# Patient Record
Sex: Male | Born: 1940 | Race: Black or African American | Hispanic: No | Marital: Single | Smoking: Former smoker
Health system: Southern US, Community
[De-identification: ages and names within clinical notes are randomized; demographics above are authoritative.]

## PROBLEM LIST (undated history)

## (undated) DIAGNOSIS — J45909 Unspecified asthma, uncomplicated: Secondary | ICD-10-CM

## (undated) DIAGNOSIS — M810 Age-related osteoporosis without current pathological fracture: Secondary | ICD-10-CM

## (undated) DIAGNOSIS — G4730 Sleep apnea, unspecified: Secondary | ICD-10-CM

## (undated) DIAGNOSIS — I10 Essential (primary) hypertension: Secondary | ICD-10-CM

## (undated) DIAGNOSIS — M1990 Unspecified osteoarthritis, unspecified site: Secondary | ICD-10-CM

## (undated) HISTORY — DX: Age-related osteoporosis without current pathological fracture: M81.0

## (undated) HISTORY — DX: Unspecified osteoarthritis, unspecified site: M19.90

## (undated) HISTORY — PX: SHOULDER ARTHROSCOPY DISTAL CLAVICLE EXCISION AND OPEN ROTATOR CUFF REPAIR: SHX2396

## (undated) HISTORY — PX: KNEE ARTHROPLASTY: SHX992

---

## 2004-09-03 ENCOUNTER — Emergency Department: Payer: Self-pay | Admitting: Internal Medicine

## 2004-09-09 ENCOUNTER — Emergency Department: Payer: Self-pay | Admitting: Internal Medicine

## 2005-05-13 ENCOUNTER — Ambulatory Visit: Payer: Self-pay | Admitting: *Deleted

## 2006-06-06 ENCOUNTER — Ambulatory Visit: Payer: Self-pay | Admitting: *Deleted

## 2007-01-08 ENCOUNTER — Ambulatory Visit: Payer: Self-pay

## 2007-04-04 ENCOUNTER — Ambulatory Visit: Payer: Self-pay | Admitting: Family Medicine

## 2007-07-24 ENCOUNTER — Ambulatory Visit: Payer: Self-pay | Admitting: Specialist

## 2008-03-07 ENCOUNTER — Inpatient Hospital Stay: Payer: Self-pay | Admitting: Internal Medicine

## 2008-04-16 ENCOUNTER — Ambulatory Visit: Payer: Self-pay | Admitting: Specialist

## 2008-07-04 ENCOUNTER — Encounter: Payer: Self-pay | Admitting: Internal Medicine

## 2008-07-24 ENCOUNTER — Encounter: Payer: Self-pay | Admitting: Internal Medicine

## 2008-08-23 ENCOUNTER — Encounter: Payer: Self-pay | Admitting: Internal Medicine

## 2009-04-16 ENCOUNTER — Inpatient Hospital Stay: Payer: Self-pay | Admitting: Internal Medicine

## 2009-05-18 ENCOUNTER — Inpatient Hospital Stay: Payer: Self-pay | Admitting: Internal Medicine

## 2010-01-12 ENCOUNTER — Ambulatory Visit: Payer: Self-pay

## 2010-02-10 ENCOUNTER — Ambulatory Visit: Payer: Self-pay

## 2011-01-06 ENCOUNTER — Ambulatory Visit: Payer: Self-pay

## 2012-10-30 ENCOUNTER — Ambulatory Visit: Payer: Self-pay | Admitting: Gastroenterology

## 2013-11-19 DIAGNOSIS — M170 Bilateral primary osteoarthritis of knee: Secondary | ICD-10-CM | POA: Insufficient documentation

## 2013-12-23 ENCOUNTER — Encounter: Payer: Self-pay | Admitting: Surgery

## 2014-01-06 ENCOUNTER — Encounter: Payer: Self-pay | Admitting: Surgery

## 2014-01-23 ENCOUNTER — Encounter: Payer: Self-pay | Admitting: Surgery

## 2014-10-20 DIAGNOSIS — M5136 Other intervertebral disc degeneration, lumbar region: Secondary | ICD-10-CM | POA: Insufficient documentation

## 2015-01-02 ENCOUNTER — Other Ambulatory Visit: Payer: Self-pay | Admitting: Internal Medicine

## 2015-01-02 DIAGNOSIS — R1012 Left upper quadrant pain: Secondary | ICD-10-CM

## 2015-01-09 ENCOUNTER — Ambulatory Visit
Admission: RE | Admit: 2015-01-09 | Discharge: 2015-01-09 | Disposition: A | Discharge status: Home / Self Care | Payer: Medicare Other | Source: Ambulatory Visit | Attending: Internal Medicine | Admitting: Internal Medicine

## 2015-01-09 DIAGNOSIS — K5730 Diverticulosis of large intestine without perforation or abscess without bleeding: Secondary | ICD-10-CM | POA: Diagnosis not present

## 2015-01-09 DIAGNOSIS — R1012 Left upper quadrant pain: Secondary | ICD-10-CM | POA: Insufficient documentation

## 2015-01-09 DIAGNOSIS — J9811 Atelectasis: Secondary | ICD-10-CM | POA: Insufficient documentation

## 2015-01-09 HISTORY — DX: Unspecified asthma, uncomplicated: J45.909

## 2015-01-09 MED ORDER — IOHEXOL 350 MG/ML SOLN
100.0000 mL | Freq: Once | INTRAVENOUS | Status: AC | PRN
  Administered 2015-01-09: 100 mL via INTRAVENOUS

## 2015-02-26 DIAGNOSIS — M62830 Muscle spasm of back: Secondary | ICD-10-CM | POA: Insufficient documentation

## 2015-03-11 DIAGNOSIS — J45909 Unspecified asthma, uncomplicated: Secondary | ICD-10-CM | POA: Insufficient documentation

## 2015-03-11 DIAGNOSIS — I878 Other specified disorders of veins: Secondary | ICD-10-CM | POA: Insufficient documentation

## 2015-03-11 DIAGNOSIS — G4733 Obstructive sleep apnea (adult) (pediatric): Secondary | ICD-10-CM | POA: Insufficient documentation

## 2015-03-11 DIAGNOSIS — I10 Essential (primary) hypertension: Secondary | ICD-10-CM | POA: Insufficient documentation

## 2015-03-11 DIAGNOSIS — I509 Heart failure, unspecified: Secondary | ICD-10-CM | POA: Insufficient documentation

## 2015-03-11 DIAGNOSIS — E7800 Pure hypercholesterolemia, unspecified: Secondary | ICD-10-CM | POA: Insufficient documentation

## 2015-04-23 ENCOUNTER — Emergency Department: Payer: Medicare Other

## 2015-04-23 ENCOUNTER — Emergency Department
Admission: EM | Admit: 2015-04-23 | Discharge: 2015-04-23 | Disposition: A | Discharge status: Home / Self Care | Payer: Medicare Other | Attending: Student | Admitting: Student

## 2015-04-23 DIAGNOSIS — R109 Unspecified abdominal pain: Secondary | ICD-10-CM

## 2015-04-23 DIAGNOSIS — I10 Essential (primary) hypertension: Secondary | ICD-10-CM | POA: Diagnosis not present

## 2015-04-23 DIAGNOSIS — N12 Tubulo-interstitial nephritis, not specified as acute or chronic: Secondary | ICD-10-CM | POA: Diagnosis not present

## 2015-04-23 DIAGNOSIS — R1032 Left lower quadrant pain: Secondary | ICD-10-CM | POA: Diagnosis present

## 2015-04-23 HISTORY — DX: Essential (primary) hypertension: I10

## 2015-04-23 LAB — COMPREHENSIVE METABOLIC PANEL
ALBUMIN: 4.6 g/dL (ref 3.5–5.0)
ALK PHOS: 57 U/L (ref 38–126)
ALT: 29 U/L (ref 17–63)
ANION GAP: 6 (ref 5–15)
AST: 27 U/L (ref 15–41)
BILIRUBIN TOTAL: 1.1 mg/dL (ref 0.3–1.2)
BUN: 14 mg/dL (ref 6–20)
CALCIUM: 9.7 mg/dL (ref 8.9–10.3)
CO2: 32 mmol/L (ref 22–32)
Chloride: 106 mmol/L (ref 101–111)
Creatinine, Ser: 1.06 mg/dL (ref 0.61–1.24)
GFR calc Af Amer: >60 mL/min (ref >60)
GFR calc non Af Amer: >60 mL/min (ref >60)
Glucose, Bld: 94 mg/dL (ref 65–99)
POTASSIUM: 3.9 mmol/L (ref 3.5–5.1)
SODIUM: 144 mmol/L (ref 135–145)
TOTAL PROTEIN: 7.8 g/dL (ref 6.5–8.1)

## 2015-04-23 LAB — URINALYSIS COMPLETE WITH MICROSCOPIC (ARMC ONLY)
Bilirubin Urine: NEGATIVE
Glucose, UA: NEGATIVE mg/dL
Hgb urine dipstick: NEGATIVE
KETONES UR: NEGATIVE mg/dL
NITRITE: POSITIVE — AB
PH: 6 (ref 5.0–8.0)
PROTEIN: NEGATIVE mg/dL
SPECIFIC GRAVITY, URINE: 1.02 (ref 1.005–1.030)

## 2015-04-23 LAB — CBC
HCT: 48.2 % (ref 40.0–52.0)
HEMOGLOBIN: 15.9 g/dL (ref 13.0–18.0)
MCH: 29.5 pg (ref 26.0–34.0)
MCHC: 33.1 g/dL (ref 32.0–36.0)
MCV: 89.3 fL (ref 80.0–100.0)
Platelets: 130 10*3/uL — ABNORMAL LOW (ref 150–440)
RBC: 5.39 MIL/uL (ref 4.40–5.90)
RDW: 14.1 % (ref 11.5–14.5)
WBC: 5.5 10*3/uL (ref 3.8–10.6)

## 2015-04-23 LAB — LIPASE, BLOOD: Lipase: 22 U/L (ref 11–51)

## 2015-04-23 MED ORDER — CEPHALEXIN 500 MG PO CAPS: 500.0000 mg | ORAL_CAPSULE | Freq: Four times a day (QID) | ORAL | Status: DC

## 2015-04-23 MED ORDER — OXYCODONE HCL 5 MG PO TABS
10.0000 mg | ORAL_TABLET | Freq: Once | ORAL | Status: AC
  Administered 2015-04-23: 10 mg via ORAL
  Filled 2015-04-23: qty 2

## 2015-04-23 MED ORDER — OXYCODONE HCL 5 MG PO TABS: 5.0000 mg | ORAL_TABLET | Freq: Four times a day (QID) | ORAL | Status: DC | PRN

## 2015-04-23 MED ORDER — IOHEXOL 300 MG/ML  SOLN
100.0000 mL | Freq: Once | INTRAMUSCULAR | Status: AC | PRN
  Administered 2015-04-23: 100 mL via INTRAVENOUS

## 2015-04-23 MED ORDER — CEPHALEXIN 500 MG PO CAPS
500.0000 mg | ORAL_CAPSULE | Freq: Once | ORAL | Status: AC
  Administered 2015-04-23: 500 mg via ORAL
  Filled 2015-04-23: qty 1

## 2015-04-23 NOTE — ED Provider Notes (Signed)
[facility] Emergency Department Provider Note  ____________________________________________  Time seen: Approximately 12:08 PM  I have reviewed the triage vital signs and the nursing notes.   HISTORY  Chief Complaint Abdominal Pain    HPI Jay Sandoval is a 74 y.o. male with history of CHF, COPD, chronic 3 L home oxygen requirement, GERD, hyperlipidemia and hypertension who presents for evaluation of 3 months gradual onset constant left lower abdominal and flank pain worse with movement, constant since onset, currently moderate. Patient reports that he was seen by his primary care doctor approximately 3 months ago for this with a CT scan that was unrevealing. Over the past month his pain has been more severe and was very severe this morning. He went to his primary care doctor's office and was referred to this emergency department for further evaluation of his pain and because his O2 saturation was 83% due to the fact that his oxygen tank had just run out. He denies any chest pain or difficulty breathing. No vomiting, diarrhea, fevers or chills. He has had dark urine but denies dysuria or hematuria.   Past Medical History  Diagnosis Date  . Asthma   . Hypertension     There are no active problems to display for this patient.   Past Surgical History  Procedure Laterality Date  . Knee arthroplasty    . Shoulder arthroscopy distal clavicle excision and open rotator cuff repair      No current outpatient prescriptions on file.  Allergies Review of patient's allergies indicates no known allergies.  No family history on file.  Social History Social History  Substance Use Topics  . Smoking status: Never Smoker   . Smokeless tobacco: None  . Alcohol Use: No    Review of Systems Constitutional: No fever/chills Eyes: No visual changes. ENT: No sore throat. Cardiovascular: Denies chest pain. Respiratory: Denies shortness of breath. Gastrointestinal:  + abdominal pain.  No nausea, no vomiting.  No diarrhea.  No constipation. Genitourinary: Negative for dysuria. Musculoskeletal: Negative for back pain. Skin: Negative for rash. Neurological: Negative for headaches, focal weakness or numbness.  10-point ROS otherwise negative.  ____________________________________________   PHYSICAL EXAM:  VITAL SIGNS: ED Triage Vitals  Enc Vitals Group     BP --      Pulse Rate 04/23/15 1104 58     Resp 04/23/15 1104 22     Temp 04/23/15 1104 98.3 F (36.8 C)     Temp Source 04/23/15 1104 Oral     SpO2 04/23/15 1104 95 %     Weight 04/23/15 1104 387 lb (175.542 kg)     Height 04/23/15 1104 5\' 8"  (1.727 m)     Head Cir --      Peak Flow --      Pain Score 04/23/15 1119 8     Pain Loc --      Pain Edu? --      Excl. in GC? --     Constitutional: Alert and oriented. Well appearing and in no acute distress. +Morbidly obese. Eyes: Conjunctivae are normal. PERRL. EOMI. Head: Atraumatic. Nose: No congestion/rhinnorhea. Mouth/Throat: Mucous membranes are moist.  Oropharynx non-erythematous. Neck: No stridor.  Cardiovascular: Normal rate, regular rhythm. Grossly normal heart sounds.  Good peripheral circulation. Respiratory: Normal respiratory effort.  No retractions. Lungs CTAB. Gastrointestinal: Soft and nontender though severely obese abdomen limits the physical examination. No distention.  No CVA tenderness.  Genitourinary: deferred Musculoskeletal: No lower extremity tenderness nor edema.  No  joint effusions. Neurologic:  Normal speech and language. No gross focal neurologic deficits are appreciated. No gait instability. Skin:  Skin is warm, dry and intact. No rash noted. Psychiatric: Mood and affect are normal. Speech and behavior are normal.  ____________________________________________   LABS (all labs ordered are listed, but only abnormal results are displayed)  Labs Reviewed  CBC - Abnormal; Notable for the following:     Platelets 130 (*)    All other components within normal limits  URINALYSIS COMPLETEWITH MICROSCOPIC ([facility] ONLY) - Abnormal; Notable for the following:    Color, Urine YELLOW (*)    APPearance HAZY (*)    Nitrite POSITIVE (*)    Leukocytes, UA 2+ (*)    Bacteria, UA MANY (*)    Squamous Epithelial / LPF 0-5 (*)    All other components within normal limits  URINE CULTURE  LIPASE, BLOOD  COMPREHENSIVE METABOLIC PANEL   ____________________________________________  EKG  none ____________________________________________  RADIOLOGY  CT abdomen and pelvis IMPRESSION: No acute findings in the abdomen/ pelvis.  1 cm cystic focus over the body of the pancreas unchanged. Recommend followup MRI 1 year. This recommendation follows ACR consensus guidelines: Managing Incidental Findings on Abdominal CT: White Paper of the ACR Incidental Findings Committee. J Am Coll Radiol 2010;7:754-773.  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  Diverticulosis of the colon without active inflammation.  Small back containing umbilical hernia unchanged.  Moderate degenerate change of the spine with disc disease at all levels of the lumbar spine.  ____________________________________________   PROCEDURES  Procedure(s) performed: None  Critical Care performed: No  ____________________________________________   INITIAL IMPRESSION / ASSESSMENT AND PLAN / ED COURSE  Pertinent labs & imaging results that were available during my care of the patient were reviewed by me and considered in my medical decision making (see chart for details).  Jay Sandoval is a 74 y.o. male with history of CHF, COPD, chronic 3 L home oxygen requirement, GERD, hyperlipidemia and hypertension who presents for evaluation of 3 months gradual onset constant left abdominal and flank pain worse with movement. On exam, he is nontoxic appearing and in no acute distress. O2 saturation 97%  on his home 3 L oxygen requirement. His exam is limited due to his obese body habitus. Labs are reviewed. CBC and CMP are unremarkable. Lipase is normal. Urinalysis is consistent with nitrite positive urinary tract infection, possibly early pyelonephritis given complaint of flank pain,  which we will treat with Keflex. CT of the abdomen and pelvis is pending to rule out infected stone and to examine the aortic contours. If unremarkable, anticipate discharge home with Keflex, short course of oxycodone for pain as well as return precautions and close PCP follow-up.  ----------------------------------------- 2:35 PM on 04/23/2015 -----------------------------------------  CT scan shows no acute intra-abdominal or intrapelvic process. Patient reports improvement of his pain at this time. DC as above. We discussed return precautions and he is comfortable with the discharge plan. ____________________________________________   FINAL CLINICAL IMPRESSION(S) / ED DIAGNOSES  Final diagnoses:  Left sided abdominal pain  Pyelonephritis      Gayla Doss, MD 04/23/15 1436

## 2015-04-23 NOTE — ED Notes (Signed)
Pt called [employer] for ride. Offered to send him EMS (since O 2 is out and no one to bring him some) but declines this option bc has no one to get his wheelchair back to residence then.  Pt reports will be ok without O2 bc quick ride home.  Pt left on our O2 while waiting on ride in lobby. Again declined offer to find him way home with O2.Marland Kitchen

## 2015-04-23 NOTE — ED Notes (Signed)
Patient transported to CT 

## 2015-04-23 NOTE — ED Notes (Signed)
Pt c/o left flank pain for the past 3 months, states he has been seen here for the same in the past, c/o having dark urine.Marland Kitchendenies N/V/D.Marland Kitchen

## 2015-04-25 LAB — URINE CULTURE: Culture: >=100000

## 2015-05-14 ENCOUNTER — Encounter: Payer: Medicare Other | Admitting: Physical Therapy

## 2015-05-18 ENCOUNTER — Ambulatory Visit: Payer: Medicare Other | Attending: Physical Medicine and Rehabilitation | Admitting: Physical Therapy

## 2015-05-18 ENCOUNTER — Encounter: Payer: Self-pay | Admitting: Physical Therapy

## 2015-05-18 VITALS — BP 116/64

## 2015-05-18 DIAGNOSIS — M545 Low back pain: Secondary | ICD-10-CM

## 2015-05-18 DIAGNOSIS — I999 Unspecified disorder of circulatory system: Secondary | ICD-10-CM | POA: Insufficient documentation

## 2015-05-18 DIAGNOSIS — J42 Unspecified chronic bronchitis: Secondary | ICD-10-CM | POA: Insufficient documentation

## 2015-05-18 DIAGNOSIS — M810 Age-related osteoporosis without current pathological fracture: Secondary | ICD-10-CM | POA: Insufficient documentation

## 2015-05-18 DIAGNOSIS — M5136 Other intervertebral disc degeneration, lumbar region: Secondary | ICD-10-CM | POA: Diagnosis present

## 2015-05-18 DIAGNOSIS — M069 Rheumatoid arthritis, unspecified: Secondary | ICD-10-CM | POA: Insufficient documentation

## 2015-05-18 DIAGNOSIS — I10 Essential (primary) hypertension: Secondary | ICD-10-CM | POA: Insufficient documentation

## 2015-05-18 NOTE — Therapy (Signed)
[place] [facility] [address] [place], [address] Phone: [phone]   Fax:  [phone]  Physical Therapy Evaluation  Patient Details  Name: Jay Sandoval MRN: [id number] Date of Birth: 05-04-1940 Referring Provider: Berneda Rose Chasinis, DO  Encounter Date: 05/18/2015      PT End of Session - 05/18/15 1528    Visit Number 1   Number of Visits 9   Date for PT Re-Evaluation 06/18/15   Authorization Type g code   PT Start Time 1240   PT Stop Time 1315   PT Time Calculation (min) 35 min   Activity Tolerance Other (comment)   Behavior During Therapy [facility] for tasks assessed/performed      Past Medical History  Diagnosis Date  . Asthma   . Hypertension   . Osteoporosis   . Arthritis     Past Surgical History  Procedure Laterality Date  . Knee arthroplasty    . Shoulder arthroscopy distal clavicle excision and open rotator cuff repair      Filed Vitals:   05/18/15 1452  BP: 116/64  SpO2: 90%    Visit Diagnosis:  Low back pain, unspecified back pain laterality, with sciatica presence unspecified - Plan: PT plan of care cert/re-cert  DDD (degenerative disc disease), lumbar - Plan: PT plan of care cert/re-cert  Circulation problem - Plan: PT plan of care cert/re-cert      Subjective Assessment - 05/18/15 1453    Subjective Pt. reports extensive Hx of LBP originally beginning around 1989 as result of industrial accident. Reports difficulty standing and unable to do so w/o use UE support. LBP aggravated w/ sit to stand and prolonged sitting, need to shift in seat to ease. Able to sleep in bed partially rolled on L side, never on R due to shoulder pain. Reports "I sit more in the chair now than ever" and only occasionally stands. Has had successful PT for LBP in the past "8-10 years ago."    Pertinent History LBP Hx since 1989   Limitations Standing;Walking;House hold activities   How long can you  sit comfortably? pt sits exclusively, other than pivot transfers. Unable to bathe, unable to stand upright.   How long can you stand comfortably? unable to stand w/o UE and flexion at hips   Patient Stated Goals "I want to walk". Pt also reports he would like decr. back pain   Currently in Pain? Yes   Pain Score 8    Pain Location Back   Aggravating Factors  prolonged sitting, transfers            [facility] PT Assessment - 05/18/15 0001    Assessment   Medical Diagnosis lumbar DDD, back muscle spasm   Referring Provider Berneda Rose Chasinis, DO   Prior Therapy yes   Precautions   Precautions Fall   Balance Screen   Has the patient fallen in the past 6 months No   Has the patient had a decrease in activity level because of a fear of falling?  Yes   Is the patient reluctant to leave their home because of a fear of falling?  Yes   Home Environment   Living Environment Private residence   Living Arrangements Alone   Type of Home Apartment   Additional Comments no stairs, ramp, handrails on both sides, pt has helper 5 days per wk, 3 hrs per day.   Prior Function   Level of Independence Needs assistance with ADLs;Needs  assistance with homemaking   Vocation On disability   Leisure "I just watch TV"   Cognition   Overall Cognitive Status Within Functional Limits for tasks assessed   Posture/Postural Control   Posture Comments Pt assessed only in power chair. shoulders forward, slouched position, sacral sitting, reclined position. Unable to correct. Pt reports altered posture is *not* due to pain   ROM / Strength   AROM / PROM / Strength AROM   AROM   Overall AROM Comments Lumbar motion assessed in power chair, all lumbar tested WNL except L sidebending limited, no pain. Pt has pain in L trap with L rotation.   PROM   Overall PROM  --  Knee ROM in sitting: R 150 deg, L 158 deg   Overall PROM Comments -22 knee ext. L, -30 on R. Unable to assess fully PROM due to inability to safely  transfer pt   Palpation   Palpation comment Incr. tension in L UT. Pt also reports pain in this region.   Transfers   Comments unable to safely demonstrate safe transfer, Pt attempted transfer from power chair to elevated plinth, able to lift butocks from chair, hoewver unable to stand or pivot so discontinued this.   Ambulation/Gait   Gait Comments unable to Multimedia programmer Yes   Wheelchair Assistance 6: Modified independent (Device/Increase time)   Distance pt in powerchair, able to utilize appropriately          Objective: Attempted hamstring stretching w/ pt sitting heel elevated on stool, discontinued due to reported anterior knee pain and not feeling stretch of the hamstring. Significant suprapatellar edema noted on L LE, pt reported "that's about how it usually looks." Deferred measurement until next  Performed glute sets 6 sec holds 6X3, pt tolerated well and visibly elevated slightly from chair while performing isometric holds.   Quad sets w/ isometric holds for 3 sec in knee extension 10X3. Pt expressed mild pain in anterior knee during maximal extension. Seated spinal rotation w/ arms crossed hands on shoulders 10 X each direction. Pt reported mild L UT area pain w/ L  rotation.  Attempted transfer to bariatric mat table. Pt stood w/ extensive WB through UE on mat table, hips remained in flexed position, unable to safely negotiate turning to position for lowering to mat, pt instructed to return to [facility].                  PT Education - 05/18/15 1525    Education provided Yes   Education Details educated in performance of HEP (quad/glute sets, spinal rotations), and increasing movement throughout day   Person(s) Educated Patient   Methods Explanation;Demonstration   Comprehension Returned demonstration;Verbalized understanding             PT Long Term Goals - 05/18/15 1703    PT LONG TERM GOAL #1   Title Pt will  demonstrate safe and pain free pivot transfer from w/c to mat  table w/ SBA help with home transfers.   Time 4   Period Weeks   Status New   PT LONG TERM GOAL #2   Title Pt demonstrate B hamstring lenght of < -10 deg. to allow standing w/ decreased muscle use   Time 4   Period Weeks   Status New   PT LONG TERM GOAL #3   Title Pt will be independant w/ HEP to improve strength to allow progression to aquatic therapy.   Time 4  Period Weeks   Status New   PT LONG TERM GOAL #4   Title Pt will improve mODI from 80% to less than 70% indicating improvement in self reported disability due to LBP   Baseline 80%   Time 4   Period Weeks   Status New               Plan - 05/19/15 1530    Clinical Impression Statement Pt is a pleasant 75 y.o. male seen today for PT in power chair. Pt c/o chronic LBP and inability to stand or walk as result of knee pain, deconditioning and potentially exacerbated due to morbid obesity. Currently pt presents with pain, inability to stand due to muscle weakness/pain, inability to transfer to a non lift-chair, extremely poor tolerance for low level activity. Pt would be appropriate for aquatic therapy however this facility is unable to provide this at this time due to wt limit on aquatic w/c and lack of ability to amb into pool on pt part. Pt would benefit from LE strength and endurance training in preparation for aquatic therapy and progressing to ambulation.    Pt will benefit from skilled therapeutic intervention in order to improve on the following deficits Decreased activity tolerance;Decreased endurance;Obesity;Increased edema;Cardiopulmonary status limiting activity;Decreased strength;Impaired UE functional use;Pain;Difficulty walking;Increased muscle spasms;Decreased mobility;Decreased range of motion;Impaired flexibility;Postural dysfunction   Rehab Potential Poor   Clinical Impairments Affecting Rehab Potential weight, age, comorbidities    PT Frequency  2x / week   PT Duration 4 weeks   PT Treatment/Interventions ADLs/Self Care Home Management;Aquatic Therapy;Neuromuscular re-education;Therapeutic activities;Therapeutic exercise;Manual techniques;Wheelchair mobility training   PT Next Visit Plan progress LE strengthing,    PT Home Exercise Plan see objective   Consulted and Agree with Plan of Care Patient          G-Codes - 05/19/15 1700    Functional Assessment Tool Used standing time, transfers   Functional Limitation Mobility: Walking and moving around   Mobility: Walking and Moving Around Current Status (S8110) At least 80 percent but less than 100 percent impaired, limited or restricted       Problem List There are no active problems to display for this patient.   Samamtha Tiegs PT DPT 19-May-2015, 5:08 PM  [place] [facility] PHYS[facility] AND SPORTS MEDICINE [address] [place], [address] Phone: [phone]   Fax:  [phone]  Name: Jay Sandoval MRN: [id number] Date of Birth: 12/12/1940

## 2015-05-21 ENCOUNTER — Ambulatory Visit: Payer: Medicare Other | Admitting: Physical Therapy

## 2015-05-21 DIAGNOSIS — M545 Low back pain: Secondary | ICD-10-CM

## 2015-05-21 DIAGNOSIS — M5136 Other intervertebral disc degeneration, lumbar region: Secondary | ICD-10-CM

## 2015-05-21 NOTE — Therapy (Signed)
[place] [facility] [address], [place], [address] Phone: [phone]   Fax:  [phone]  Physical Therapy Treatment  Patient Details  Name: Jay Sandoval MRN: [id number] Date of Birth: 1940/09/09 Referring Provider: Berneda Rose Chasinis, DO  Encounter Date: 05/21/2015      PT End of Session - 05/21/15 1415    Visit Number 2   Number of Visits 9   Date for PT Re-Evaluation 06/18/15   Authorization Type g code   Activity Tolerance Other (comment)   Behavior During Therapy [facility] for tasks assessed/performed      Past Medical History  Diagnosis Date  . Asthma   . Hypertension   . Osteoporosis   . Arthritis     Past Surgical History  Procedure Laterality Date  . Knee arthroplasty    . Shoulder arthroscopy distal clavicle excision and open rotator cuff repair      There were no vitals filed for this visit.  Visit Diagnosis:  DDD (degenerative disc disease), lumbar  Low back pain, unspecified back pain laterality, with sciatica presence unspecified  Circulation problem      Subjective Assessment - 05/21/15 1255    Subjective Pt reports having an icreased level of pain in B knees and low back "about the same as last time", reports he will be having "injections in both knees on Monday"    Pertinent History LBP Hx since 1989   Limitations Standing;Walking;House hold activities   How long can you sit comfortably? pt sits exclusively, other than pivot transfers. Unable to bathe, unable to stand upright.   How long can you stand comfortably? unable to stand w/o UE and flexion at hips   Patient Stated Goals "I want to walk". Pt also reports he would like decr. back pain   Currently in Pain? Yes   Pain Score 8    Pain Location Back         Objective:   Performed manually resisted knee extension w/ pt seated 3X12 to address LE weakness and prepare for standing/transfers. Pt reports some L low  back and knee discomfort w/ extension of L LE. Notably less strength in L LE compared to R. Seated perturbations w/ pt sitting up right, back away from back rest of w/c, arms crossed over body hands on shoulders. Pt tolerated well w/ no complaints of discomfort.   Manually resisted triceps extension in seated position to increase strength for standing/transfers. R UE notably less strength. Pt tolerated well stating "I feel the burn with that" indicating triceps area.     STM at L lumbar paraspinals and L UT/LS to address pt reports of significant tightness and tenderness in L lumbar paraspinals. Tolerated well stating "it feels like they're starting to relax". Apparent decrease in UT/LS/lumbar paraspinal post STM.  Performed seated marching 3X15 each LE. Report of difficulty lifting L LE, decreased clearance height and bracing on w/c when lifting L  LE.                         PT Education - 05/21/15 1410    Education provided Yes   Education Details instructed in performance and addition of marching to HEP   Person(s) Educated Patient   Methods Explanation;Demonstration   Comprehension Verbalized understanding;Verbal cues required             PT Long Term Goals - 05/18/15 1703    PT LONG TERM GOAL #  1   Title Pt will demonstrate safe and pain free pivot transfer from w/c to mat  table w/ SBA help with home transfers.   Time 4   Period Weeks   Status New   PT LONG TERM GOAL #2   Title Pt demonstrate B hamstring lenght of < -10 deg. to allow standing w/ decreased muscle use   Time 4   Period Weeks   Status New   PT LONG TERM GOAL #3   Title Pt will be independant w/ HEP to improve strength to allow progression to aquatic therapy.   Time 4   Period Weeks   Status New   PT LONG TERM GOAL #4   Title Pt will improve mODI from 80% to less than 70% indicating improvement in self reported disability due to LBP   Baseline 80%   Time 4   Period Weeks   Status New                Plan - 05/21/15 1417    Clinical Impression Statement Pt curently presents with decreased LE and triceps strength, inability to WB or transfer. Pt will benefit from skilled PT to address strength deficits in preperation for standing/transfers.    Pt will benefit from skilled therapeutic intervention in order to improve on the following deficits Decreased activity tolerance;Decreased endurance;Obesity;Increased edema;Cardiopulmonary status limiting activity;Decreased strength;Impaired UE functional use;Pain;Difficulty walking;Increased muscle spasms;Decreased mobility;Decreased range of motion;Impaired flexibility;Postural dysfunction   Rehab Potential Poor   Clinical Impairments Affecting Rehab Potential weight, age, comorbidities    PT Frequency 2x / week   PT Duration 4 weeks   PT Treatment/Interventions ADLs/Self Care Home Management;Aquatic Therapy;Neuromuscular re-education;Therapeutic activities;Therapeutic exercise;Manual techniques;Wheelchair mobility training   PT Next Visit Plan progress LE strengthing,    PT Home Exercise Plan see objective   Consulted and Agree with Plan of Care Patient        Problem List There are no active problems to display for this patient.   Emilia Beck Rij STM 05/21/2015, 2:20 PM Su Hoff PT DPT [facility] AND SPORTS MEDICINE [address], [place], [address] Phone: [phone]   Fax:  [phone]  Name: Jay Sandoval MRN: [id number] Date of Birth: [birth year]

## 2015-05-25 ENCOUNTER — Encounter: Payer: Medicare Other | Admitting: Physical Therapy

## 2015-05-28 ENCOUNTER — Ambulatory Visit: Payer: Medicare Other | Attending: Physical Medicine and Rehabilitation | Admitting: Physical Therapy

## 2015-05-28 ENCOUNTER — Encounter: Payer: Self-pay | Admitting: Physical Therapy

## 2015-05-28 DIAGNOSIS — M545 Low back pain: Secondary | ICD-10-CM | POA: Diagnosis present

## 2015-05-28 DIAGNOSIS — M6281 Muscle weakness (generalized): Secondary | ICD-10-CM | POA: Diagnosis present

## 2015-05-28 NOTE — Therapy (Signed)
[facility] [address], [place], [address] Phone: [phone]   Fax:  [phone]  Physical Therapy Treatment  Patient Details  Name: Jay Sandoval MRN: [id number] Date of Birth: 13-Apr-1941 Referring Provider: Berneda Rose Chasinis, DO  Encounter Date: 05/28/2015      PT End of Session - 05/28/15 1327    Visit Number 3   Number of Visits 9   Date for PT Re-Evaluation 06/18/15   Authorization Type g code   PT Start Time 1250   PT Stop Time 1335   PT Time Calculation (min) 45 min   Activity Tolerance Other (comment)   Behavior During Therapy [facility] for tasks assessed/performed      Past Medical History  Diagnosis Date  . Asthma   . Hypertension   . Osteoporosis   . Arthritis     Past Surgical History  Procedure Laterality Date  . Knee arthroplasty    . Shoulder arthroscopy distal clavicle excision and open rotator cuff repair      There were no vitals filed for this visit.  Visit Diagnosis:  Low back pain, unspecified back pain laterality, with sciatica presence unspecified  Muscle weakness      Subjective Assessment - 05/28/15 1250    Subjective Pt reports feeling "a whole lot better" after last session and that his back "feels a whole lot looser."   Pertinent History LBP Hx since 1989   Limitations Standing;Walking;House hold activities   How long can you sit comfortably? pt sits exclusively, other than pivot transfers. Unable to bathe, unable to stand upright.   How long can you stand comfortably? unable to stand w/o UE and flexion at hips   Patient Stated Goals "I want to walk". Pt also reports he would like decr. back pain   Currently in Pain? Yes   Pain Score 2    Pain Location Back   Multiple Pain Sites No        Objective:   3x20 sec B isometric hip ABD to incr strength in B LE and gain strength needed for standing and transfers.  L LE ABD stronger response than R LE.   Pt reported he felt "muscles in my hips" working during exercise. 3x20 sec B isometric hip ADD with theraball to incr strength in B LE.  Pt had noted fatigue after performing exercise but no incr in pain. PT observed pt perform indep self transfer from motorized chair to mat table.  Unable to stand while performing, used hands for support with stooped squat transfer,  Pt has noted difficulty with transfer although was able to complete under his own power. 3x15 leg press using black TB to incr strength in B LE and UE.  Pt held TB while pushing with his LE reporting minor stiffness and soreness in the knees, particularly the R during exercise.  Noted difficulty for pt to flex hip due to adiposity. 1x10 seated cable rows 15#, 2x10 20# to incr strength in B UE needed to self transfer.  Cuing needed for pt to retract and depress shoulders throughout exercise.                           PT Education - 05/28/15 1302    Education provided Yes   Education Details Pt educated on new HEP and to retract and depress shoulders during UE exercises.   Person(s) Educated Patient   Methods Explanation;Demonstration;Verbal cues;Tactile  cues   Comprehension Verbalized understanding;Returned demonstration             PT Long Term Goals - 05/18/15 1703    PT LONG TERM GOAL #1   Title Pt will demonstrate safe and pain free pivot transfer from w/c to mat  table w/ SBA help with home transfers.   Time 4   Period Weeks   Status New   PT LONG TERM GOAL #2   Title Pt demonstrate B hamstring lenght of < -10 deg. to allow standing w/ decreased muscle use   Time 4   Period Weeks   Status New   PT LONG TERM GOAL #3   Title Pt will be independant w/ HEP to improve strength to allow progression to aquatic therapy.   Time 4   Period Weeks   Status New   PT LONG TERM GOAL #4   Title Pt will improve mODI from 80% to less than 70% indicating improvement in self reported disability due to LBP    Baseline 80%   Time 4   Period Weeks   Status New               Plan - 05/28/15 1305    Clinical Impression Statement Pt continues to present with decr global muscle weakness in B LE however shows improvement as indicated by an incr tolerance for exercise and activity.  Pt fatigues quickly with exercise which forces PT to utilize shorter bouts in order to catch breath.  Pt possess significant strength and endurance deficits that will require skilled PT to meet goals.   Pt will benefit from skilled therapeutic intervention in order to improve on the following deficits Decreased activity tolerance;Decreased endurance;Obesity;Increased edema;Cardiopulmonary status limiting activity;Decreased strength;Impaired UE functional use;Pain;Difficulty walking;Increased muscle spasms;Decreased mobility;Decreased range of motion;Impaired flexibility;Postural dysfunction   Rehab Potential Poor   Clinical Impairments Affecting Rehab Potential weight, age, comorbidities    PT Frequency 2x / week   PT Duration 4 weeks   PT Treatment/Interventions ADLs/Self Care Home Management;Aquatic Therapy;Neuromuscular re-education;Therapeutic activities;Therapeutic exercise;Manual techniques;Wheelchair mobility training   PT Next Visit Plan progress LE strengthing,    PT Home Exercise Plan see objective   Consulted and Agree with Plan of Care Patient        Problem List There are no active problems to display for this patient.   Vilinda Flake 05/28/2015, 1:39 PM  Su Hoff PT DPT  [place] [facility] SPORTS MEDIC[facility] [address], [place], [address] Phone: [phone]   Fax:  [phone]  Name: Jay Sandoval MRN: [id number] Date of Birth: 31-Jul-1940

## 2015-06-01 ENCOUNTER — Ambulatory Visit: Payer: Medicare Other | Admitting: Physical Therapy

## 2015-06-04 ENCOUNTER — Ambulatory Visit: Payer: Medicare Other | Admitting: Physical Therapy

## 2015-06-08 ENCOUNTER — Ambulatory Visit: Payer: Medicare Other | Admitting: Physical Therapy

## 2015-06-08 DIAGNOSIS — M545 Low back pain: Secondary | ICD-10-CM | POA: Diagnosis not present

## 2015-06-08 DIAGNOSIS — M6281 Muscle weakness (generalized): Secondary | ICD-10-CM

## 2015-06-08 NOTE — Therapy (Signed)
[facility] [address], [place], [address] Phone: [phone]   Fax:  [phone]  Physical Therapy Treatment  Patient Details  Name: Jay Sandoval MRN: [id number] Date of Birth: 1940/06/04 Referring Provider: Berneda Rose Chasinis, DO  Encounter Date: 06/08/2015      PT End of Session - 06/08/15 1540    Visit Number 4   Number of Visits 9   Date for PT Re-Evaluation 06/18/15   Authorization Type g code   PT Start Time 1250   PT Stop Time 1345   PT Time Calculation (min) 55 min   Activity Tolerance Other (comment)   Behavior During Therapy [facility] for tasks assessed/performed      Past Medical History  Diagnosis Date  . Asthma   . Hypertension   . Osteoporosis   . Arthritis     Past Surgical History  Procedure Laterality Date  . Knee arthroplasty    . Shoulder arthroscopy distal clavicle excision and open rotator cuff repair      There were no vitals filed for this visit.  Visit Diagnosis:  Muscle weakness  Low back pain, unspecified back pain laterality, with sciatica presence unspecified      Subjective Assessment - 06/08/15 1535    Subjective Pt reports having much less LBP since starting PT. Reports appointment tomorrow with orthopedist about painful lump superior medial knee. Pt reports not being as sore as before since therapy. Reports he will be having gastric bypass surgery sometime in April and has been advised with diet changes to reduce calories/"solid food" intake.   Pertinent History LBP Hx since 1989   Limitations Standing;Walking;House hold activities   How long can you sit comfortably? pt sits exclusively, other than pivot transfers. Unable to bathe, unable to stand upright.   How long can you stand comfortably? unable to stand w/o UE and flexion at hips   Patient Stated Goals "I want to walk". Pt also reports he would like decr. back pain   Currently in Pain? No/denies    Pain Score 0-No pain      Objective:  Performed manually resisted triceps extension in sitting 4X8, pt was noticeably stronger than last session during this exercise. Pt had difficulty completing his final set with significant decrease in strength noted from first set to last.  Next pt performed manually resisted knee extension in sitting 3X12, pt has less strength in L LE and reported knee discomfort when performing this exercise. SPO2= 96, HR= 64 Hip ADD in seated done by squeezing a small Swiss ball 4X20 sec holds, pt tolerated well reporting burning and fatigue of the inner thighs.  Performed manually resisted hip ABD 4X20 sec holds. Pt responded well maintaining good quality resistance for all sets and reps.  Concluded session with seated rows at [employer] cable machine 20# X10, 25# 2X10. Pt required cueing to retract scapula and depress R shoulder.                            PT Education - 06/08/15 1539    Education provided Yes   Education Details HEP for elbow extension using black tubing, discussed POC to progress from strengthing to standing to independant ambulation.    Person(s) Educated Patient   Methods Explanation;Demonstration;Verbal cues;Tactile cues   Comprehension Verbal cues required;Returned demonstration;Verbalized understanding             PT Long Term Goals -  05/18/15 1703    PT LONG TERM GOAL #1   Title Pt will demonstrate safe and pain free pivot transfer from w/c to mat  table w/ SBA help with home transfers.   Time 4   Period Weeks   Status New   PT LONG TERM GOAL #2   Title Pt demonstrate B hamstring lenght of < -10 deg. to allow standing w/ decreased muscle use   Time 4   Period Weeks   Status New   PT LONG TERM GOAL #3   Title Pt will be independant w/ HEP to improve strength to allow progression to aquatic therapy.   Time 4   Period Weeks   Status New   PT LONG TERM GOAL #4   Title Pt will improve mODI from 80% to less  than 70% indicating improvement in self reported disability due to LBP   Baseline 80%   Time 4   Period Weeks   Status New               Plan - 06/08/15 1541    Clinical Impression Statement Pt shows increased strength of B LE and elbow extension as seen with improved activity tolerance and increased resistance. Monitor and encourage pt adherence to HEP. Progress pt to increased LE and triceps extension resistance to facilitate independent standing and ambulation.    Pt will benefit from skilled therapeutic intervention in order to improve on the following deficits Decreased activity tolerance;Decreased endurance;Obesity;Increased edema;Cardiopulmonary status limiting activity;Decreased strength;Impaired UE functional use;Pain;Difficulty walking;Increased muscle spasms;Decreased mobility;Decreased range of motion;Impaired flexibility;Postural dysfunction   Rehab Potential Poor   Clinical Impairments Affecting Rehab Potential weight, age, comorbidities    PT Frequency 2x / week   PT Duration 4 weeks   PT Treatment/Interventions ADLs/Self Care Home Management;Aquatic Therapy;Neuromuscular re-education;Therapeutic activities;Therapeutic exercise;Manual techniques;Wheelchair mobility training   PT Next Visit Plan progress LE strengthing,    PT Home Exercise Plan see objective   Consulted and Agree with Plan of Care Patient        Problem List There are no active problems to display for this patient.   Emilia Beck Rij SPT 06/08/2015, 3:42 PM Su Hoff PT DPT  [place] [facility] PH[facility] AND SPORTS MEDICINE [address] [place], [address] Phone: [phone]   Fax:  [phone]  Name: Jay Sandoval MRN: [id number] Date of Birth: [birth year]

## 2015-06-11 ENCOUNTER — Ambulatory Visit: Payer: Medicare Other | Admitting: Physical Therapy

## 2015-06-15 ENCOUNTER — Ambulatory Visit: Payer: Medicare Other | Admitting: Physical Therapy

## 2015-06-15 DIAGNOSIS — M545 Low back pain: Secondary | ICD-10-CM

## 2015-06-15 DIAGNOSIS — M6281 Muscle weakness (generalized): Secondary | ICD-10-CM

## 2015-06-15 NOTE — Therapy (Signed)
[place] [facility] [address], [place], [address] Phone: [phone]   Fax:  [phone]  Physical Therapy Treatment  Patient Details  Name: Jay Sandoval MRN: [id number] Date of Birth: Nov 05, 1940 Referring Provider: Berneda Rose Chasinis, DO  Encounter Date: 06/15/2015      PT End of Session - 06/15/15 1722    Visit Number 5   Number of Visits 9   Date for PT Re-Evaluation 06/18/15   Authorization Type g code   PT Start Time 1430   PT Stop Time 1515   PT Time Calculation (min) 45 min   Activity Tolerance Other (comment)   Behavior During Therapy [facility] for tasks assessed/performed      Past Medical History  Diagnosis Date  . Asthma   . Hypertension   . Osteoporosis   . Arthritis     Past Surgical History  Procedure Laterality Date  . Knee arthroplasty    . Shoulder arthroscopy distal clavicle excision and open rotator cuff repair      There were no vitals filed for this visit.  Visit Diagnosis:  Muscle weakness  Low back pain, unspecified back pain laterality, with sciatica presence unspecified      Subjective Assessment - 06/15/15 1652    Subjective Reports visiting dr. about bump on knee as reported last session, states "dr. wants to monitor the bump at this time and is not concerned", reports decreased resting LBP and improved ability to transfer at home   Pertinent History LBP Hx since 1989   Limitations Standing;Walking;House hold activities   How long can you sit comfortably? pt sits exclusively, other than pivot transfers. Unable to bathe, unable to stand upright.   How long can you stand comfortably? unable to stand w/o UE and flexion at hips   Patient Stated Goals "I want to walk". Pt also reports he would like decr. back pain   Currently in Pain? Yes   Pain Score 2    Pain Location Back        Objective:   Pt demonstrated transfer from W/C to mat table with little  difficulty and improved speed and stability compared to last time transfer was performed. Manually resisted trunk R/L rotation, flexion, and extension was performed while pt was sitting on mat table, pt held PVC pipe while PT applied randomized resistance to pipe for 2 minutes X3. Pt tolerated well reporting getting tired during last set and moderate SOB.  Seated hamstring curls w/ GTB 4X10, pt reported cramping with first set which resolved after short rest, pt had difficulty completing final set with full ROM.  Manually resisted knee extension 3X10 to address LE weakness and difficulty standing, pt tolerated well reporting his knees hurting less than the first time this exercise was performed.     mODI - 74%                      PT Education - 06/15/15 1721    Education provided Yes   Education Details HEP is essential for successful therapy   Person(s) Educated Patient   Methods Explanation   Comprehension Verbalized understanding             PT Long Term Goals - 06/15/15 1430    PT LONG TERM GOAL #1   Title Pt will demonstrate safe and pain free pivot transfer from w/c to mat  table w/ SBA help with home transfers.   Time 4  Period Weeks   Status Achieved   PT LONG TERM GOAL #2   Title Pt demonstrate B hamstring lenght of < -10 deg. to allow standing w/ decreased muscle use   Time 4   Period Weeks   Status Partially Met   PT LONG TERM GOAL #3   Title Pt will be independant w/ HEP to improve strength to allow progression to aquatic therapy.   Time 4   Period Weeks   Status Partially Met   PT LONG TERM GOAL #4   Title Pt will improve mODI from 80% to less than 70% indicating improvement in self reported disability due to LBP   Baseline 80% at baseline, 74% on 06/15/2015   Time 4   Period Weeks   Status Partially Met               Plan - 06/15/15 1723    Clinical Impression Statement Pt demonstrates improved ability to transfer to mat table as  seen with improved stability and speed, increased triceps strength, and decreased resting LBP. Pt has not been consistent in attending scheduled therapy sessions and performing HEP. Pt would benefit from education emphasizing on consist performance of exercise and therapy.    Pt will benefit from skilled therapeutic intervention in order to improve on the following deficits Decreased activity tolerance;Decreased endurance;Obesity;Increased edema;Cardiopulmonary status limiting activity;Decreased strength;Impaired UE functional use;Pain;Difficulty walking;Increased muscle spasms;Decreased mobility;Decreased range of motion;Impaired flexibility;Postural dysfunction   Rehab Potential Poor   Clinical Impairments Affecting Rehab Potential weight, age, comorbidities    PT Frequency 2x / week   PT Duration 4 weeks   PT Treatment/Interventions ADLs/Self Care Home Management;Aquatic Therapy;Neuromuscular re-education;Therapeutic activities;Therapeutic exercise;Manual techniques;Wheelchair mobility training   PT Next Visit Plan progress LE strengthing,    PT Home Exercise Plan see objective   Consulted and Agree with Plan of Care Patient        Problem List There are no active problems to display for this patient.   [name]  SPT 06/16/2015, 1:34 PM  [place] [facility] [address] [place], [address] Phone: [phone]   Fax:  [phone]  Name: Jay Sandoval MRN: [id number] Date of Birth: 09-02-1940  This entire session was performed under direct supervision and direction of a licensed therapist/therapist assistant . I have personally read, edited and approve of the note as written.  Kerman Passey, PT, DPT

## 2015-06-17 DIAGNOSIS — Z9989 Dependence on other enabling machines and devices: Secondary | ICD-10-CM | POA: Insufficient documentation

## 2015-06-23 ENCOUNTER — Other Ambulatory Visit: Payer: Self-pay | Admitting: Cardiology

## 2015-06-23 DIAGNOSIS — R0602 Shortness of breath: Secondary | ICD-10-CM | POA: Insufficient documentation

## 2015-06-23 DIAGNOSIS — R079 Chest pain, unspecified: Secondary | ICD-10-CM | POA: Insufficient documentation

## 2015-06-23 DIAGNOSIS — Z713 Dietary counseling and surveillance: Secondary | ICD-10-CM

## 2015-06-25 ENCOUNTER — Encounter: Payer: Medicare Other | Admitting: Physical Therapy

## 2015-06-29 ENCOUNTER — Ambulatory Visit: Payer: Medicare Other | Attending: Physical Medicine and Rehabilitation

## 2015-06-29 VITALS — HR 71

## 2015-06-29 DIAGNOSIS — M6281 Muscle weakness (generalized): Secondary | ICD-10-CM

## 2015-06-29 DIAGNOSIS — M545 Low back pain: Secondary | ICD-10-CM | POA: Diagnosis not present

## 2015-06-29 DIAGNOSIS — M5136 Other intervertebral disc degeneration, lumbar region: Secondary | ICD-10-CM | POA: Diagnosis present

## 2015-06-29 NOTE — Therapy (Signed)
[place] [facility] [address], [place], [address] Phone: [phone]   Fax:  [phone]  Physical Therapy Treatment  Patient Details  Name: Jay Sandoval MRN: [id number] Date of Birth: 1940/11/20 Referring Provider: Juanda Crumble Chasinis, DO  Encounter Date: 06/29/2015      PT End of Session - 06/29/15 1436    Visit Number 6   Number of Visits 17   Date for PT Re-Evaluation 07/27/15   Authorization Type g code   PT Start Time 4765   PT Stop Time 1457   PT Time Calculation (min) 42 min   Activity Tolerance Other (comment)   Behavior During Therapy [facility] for tasks assessed/performed      Past Medical History  Diagnosis Date  . Asthma   . Hypertension   . Osteoporosis   . Arthritis     Past Surgical History  Procedure Laterality Date  . Knee arthroplasty    . Shoulder arthroscopy distal clavicle excision and open rotator cuff repair      Filed Vitals:   06/29/15 1423  Pulse: 71  SpO2: 93%    Visit Diagnosis:  Low back pain, unspecified back pain laterality, with sciatica presence unspecified  Muscle weakness  DDD (degenerative disc disease), lumbar      Subjective Assessment - 06/29/15 1424    Subjective Pt reports that his back is not bothering him today but he is having severe bilateral knee pain. He has an upcoming stress test next week to assess his cardiac function. Pt denies back pain at this time. He reports general worsening of his breathing over the last 2-3 years. He is performing HEP 2-3 times/day per self report. He states he is limited due to breathing.    Pertinent History LBP Hx since 1989   Limitations Standing;Walking;House hold activities   How long can you sit comfortably? pt sits exclusively, other than pivot transfers. Unable to bathe, unable to stand upright.   How long can you stand comfortably? unable to stand w/o UE and flexion at hips   Patient Stated Goals "I want  to walk". Pt also reports he would like decr. back pain   Currently in Pain? Yes  Denies back pain at this time   Pain Score 10-Worst pain ever   Pain Location Knee   Pain Orientation Right;Left   Pain Descriptors / Indicators Sharp   Pain Type Chronic pain   Multiple Pain Sites No      Objective:   Attempted NuStep with patient. He is able to to transfer into NuStep but reports too much bilateral knee pain when attempting to perform. Must be discontinued and pt transferred back to wheelchair Seated marches 2 x 15; Seated manually resisted clams 2 x 15; Seated adductor squeezes with yellow pball 2 x 15; Seated manually resisted LAQ 2 x 15; Seated hamstring curls RTB 2 x 15; Seated heel raises 2 x 15;  Pt held PVC pipe and performed chest press with manual resistance 2 x 10 as well as rows 2 x 10; Attempted sit to stand transfers from elevated mat table but pt only able to perform 2 repetitions first set and 3 repetitions second set. Pt has to stop due to bilateral knee pain, LE weakness, and increased respiratory distress. Pt reports "my knee just ain't ready yet." SaO2 remains >90% on room air throughout session.  Pt demonstrated transfer from W/C to mat table with mild to moderate difficulty but no concerns  for safety; Pt provided seated rest breaks throughout session due to fatigue                             PT Education - 07/24/15 1435    Education provided Yes   Education Details HEP reinforced   Person(s) Educated Patient   Methods Explanation   Comprehension Verbalized understanding             PT Long Term Goals - 24-Jul-2015 1437    PT LONG TERM GOAL #1   Title Pt will demonstrate safe and pain free pivot transfer from w/c to mat  table w/ SBA help with home transfers.   Time 4   Period Weeks   Status Achieved   PT LONG TERM GOAL #2   Title Pt demonstrate B hamstring lenght of < -10 deg. to allow standing w/ decreased muscle use   Time  4   Period Weeks   Status Partially Met   PT LONG TERM GOAL #3   Title Pt will be independant w/ HEP to improve strength to allow progression to aquatic therapy.   Time 4   Period Weeks   Status Partially Met   PT LONG TERM GOAL #4   Title Pt will improve mODI from 80% to less than 70% indicating improvement in self reported disability due to LBP   Baseline 80% at baseline, 74% on 06/15/2015   Time 4   Period Weeks   Status Partially Met               Plan - 2015/07/24 1452    Clinical Impression Statement Pt is severly limited during PT session today due to bilateral knee pain as well as SOB. Pt reports minimal to no LBP at this time but increased bilateral knee pain. Pt is severely limited in functional mobility. He is unable to tolerate standing at this time due to bilateral knee pain. Pt will benefit from continued skilled PT services to address deficits in transfers, low back pain, and general LE weakness in order to improve function at home and decrease fall risk.    Pt will benefit from skilled therapeutic intervention in order to improve on the following deficits Decreased activity tolerance;Decreased endurance;Obesity;Increased edema;Cardiopulmonary status limiting activity;Decreased strength;Impaired UE functional use;Pain;Difficulty walking;Increased muscle spasms;Decreased mobility;Decreased range of motion;Impaired flexibility;Postural dysfunction   Rehab Potential Poor   Clinical Impairments Affecting Rehab Potential weight, age, comorbidities    PT Frequency 2x / week   PT Duration 4 weeks   PT Treatment/Interventions ADLs/Self Care Home Management;Aquatic Therapy;Neuromuscular re-education;Therapeutic activities;Therapeutic exercise;Manual techniques;Wheelchair mobility training   PT Next Visit Plan progress LE strengthing,    PT Home Exercise Plan As prescribed   Consulted and Agree with Plan of Care Patient          G-Codes - Jul 24, 2015 1439    Functional  Assessment Tool Used standing time, transfers, mODI   Functional Limitation Mobility: Walking and moving around   Mobility: Walking and Moving Around Current Status (N6295) At least 60 percent but less than 80 percent impaired, limited or restricted   Mobility: Walking and Moving Around Goal Status (M8413) At least 60 percent but less than 80 percent impaired, limited or restricted      Problem List There are no active problems to display for this patient.  Phillips Grout PT, DPT   Isela Stantz 24-Jul-2015, 2:58 PM  [place] [facility] [address], [place], [address] Phone: [phone]   Fax:  [phone]  Name: Jay Sandoval MRN: [id number] Date of Birth: [birth year]

## 2015-07-02 ENCOUNTER — Ambulatory Visit: Payer: Medicare Other | Admitting: Physical Therapy

## 2015-07-02 DIAGNOSIS — M545 Low back pain: Secondary | ICD-10-CM | POA: Diagnosis not present

## 2015-07-02 NOTE — Therapy (Signed)
[facility] [address], [place], [address] Phone: [phone]   Fax:  [phone]  Physical Therapy Treatment/Discharge  Patient Details  Name: Jay Sandoval MRN: [id number] Date of Birth: 12-03-1940 Referring Provider: Juanda Crumble Chasinis, DO  Encounter Date: 07/02/2015      PT End of Session - 07/02/15 1448    Visit Number 7   Number of Visits 17   Date for PT Re-Evaluation 07/27/15   Authorization Type g code   PT Start Time 1418   PT Stop Time 1425   PT Time Calculation (min) 7 min   Activity Tolerance Other (comment)   Behavior During Therapy [facility] for tasks assessed/performed      Past Medical History  Diagnosis Date  . Asthma   . Hypertension   . Osteoporosis   . Arthritis     Past Surgical History  Procedure Laterality Date  . Knee arthroplasty    . Shoulder arthroscopy distal clavicle excision and open rotator cuff repair      There were no vitals filed for this visit.  Visit Diagnosis:  Low back pain, unspecified back pain laterality, with sciatica presence unspecified      Subjective Assessment - 07/02/15 1447    Subjective Pt reports no back pain. "I really haven't had any in a few weeks".   Pertinent History LBP Hx since 1989   Limitations Standing;Walking;House hold activities   How long can you sit comfortably? pt sits exclusively, other than pivot transfers. Unable to bathe, unable to stand upright.   How long can you stand comfortably? unable to stand w/o UE and flexion at hips   Patient Stated Goals "I want to walk". Pt also reports he would like decr. back pain   Currently in Pain? No/denies   Pain Score 0-No pain                                 PT Education - 07/02/15 1448    Education provided Yes   Education Details educated pt on discontinuing PT as he is pain free with regard to his referring pain   Person(s) Educated Patient    Methods Explanation   Comprehension Verbalized understanding             PT Long Term Goals - 07/02/15 1452    PT LONG TERM GOAL #1   Title Pt will demonstrate safe and pain free pivot transfer from w/c to mat  table w/ SBA help with home transfers.   Time 4   Period Weeks   Status Achieved   PT LONG TERM GOAL #2   Title Pt demonstrate B hamstring lenght of < -10 deg. to allow standing w/ decreased muscle use   Time 4   Period Weeks   Status Partially Met   PT LONG TERM GOAL #3   Title Pt will be independant w/ HEP to improve strength to allow progression to aquatic therapy.   Time 4   Period Weeks   Status Achieved   PT LONG TERM GOAL #4   Title Pt will improve mODI from 80% to less than 70% indicating improvement in self reported disability due to LBP   Time 4   Period Weeks   Status Achieved               Plan - 07/02/15 1449    Clinical Impression Statement  Pt is now appropriate for d/c. he is having no pain in back. Pt does have knee pain and is limited in his tolerance for activity due to this so pt to be discharged, encouraged to follow up for potential additional PT following his surgery if needed for advice on how to start exercising.   Pt will benefit from skilled therapeutic intervention in order to improve on the following deficits Decreased activity tolerance;Decreased endurance;Obesity;Increased edema;Cardiopulmonary status limiting activity;Decreased strength;Impaired UE functional use;Pain;Difficulty walking;Increased muscle spasms;Decreased mobility;Decreased range of motion;Impaired flexibility;Postural dysfunction   Rehab Potential Poor   Clinical Impairments Affecting Rehab Potential weight, age, comorbidities    PT Frequency 2x / week   PT Duration 4 weeks   PT Treatment/Interventions ADLs/Self Care Home Management;Aquatic Therapy;Neuromuscular re-education;Therapeutic activities;Therapeutic exercise;Manual techniques;Wheelchair mobility training    PT Next Visit Plan progress LE strengthing,    PT Home Exercise Plan As prescribed   Consulted and Agree with Plan of Care Patient          G-Codes - 22-Jul-2015 1455    Functional Assessment Tool Used standing time, transfers, mODI   Functional Limitation Mobility: Walking and moving around   Mobility: Walking and Moving Around Current Status [id number]) At least 60 percent but less than 80 percent impaired, limited or restricted   Mobility: Walking and Moving Around Goal Status [id number]) At least 60 percent but less than 80 percent impaired, limited or restricted   Mobility: Walking and Moving Around Discharge Status [phone]) At least 60 percent but less than 80 percent impaired, limited or restricted      Problem List There are no active problems to display for this patient.   Fisher,Benjamin PT DPT 2015/07/22, 2:56 PM  [facility] [address] [place], [address] Phone: [phone]   Fax:  [phone]  Name: Jay Sandoval MRN: [id number] Date of Birth: 18-Dec-1940

## 2015-07-06 ENCOUNTER — Ambulatory Visit
Admission: RE | Admit: 2015-07-06 | Discharge: 2015-07-06 | Disposition: A | Discharge status: Home / Self Care | Payer: Medicare Other | Source: Ambulatory Visit | Attending: Cardiology | Admitting: Cardiology

## 2015-07-06 ENCOUNTER — Other Ambulatory Visit: Payer: Self-pay | Admitting: Cardiology

## 2015-07-06 DIAGNOSIS — R931 Abnormal findings on diagnostic imaging of heart and coronary circulation: Secondary | ICD-10-CM | POA: Diagnosis not present

## 2015-07-06 DIAGNOSIS — Z01818 Encounter for other preprocedural examination: Secondary | ICD-10-CM | POA: Diagnosis not present

## 2015-07-06 DIAGNOSIS — Z713 Dietary counseling and surveillance: Secondary | ICD-10-CM

## 2015-07-06 MED ORDER — TECHNETIUM TC 99M SESTAMIBI - CARDIOLITE
31.0800 | Freq: Once | INTRAVENOUS | Status: AC | PRN
  Administered 2015-07-06: 10:00:00 31.08 via INTRAVENOUS

## 2015-07-06 MED ORDER — REGADENOSON 0.4 MG/5ML IV SOLN
0.4000 mg | Freq: Once | INTRAVENOUS | Status: AC
  Administered 2015-07-06: 0.4 mg via INTRAVENOUS

## 2015-07-07 ENCOUNTER — Encounter: Payer: Medicare Other | Admitting: Physical Therapy

## 2015-07-07 ENCOUNTER — Encounter
Admission: RE | Admit: 2015-07-07 | Discharge: 2015-07-07 | Disposition: A | Discharge status: Home / Self Care | Payer: Medicare Other | Source: Ambulatory Visit | Attending: Cardiology | Admitting: Cardiology

## 2015-07-07 DIAGNOSIS — Z01818 Encounter for other preprocedural examination: Secondary | ICD-10-CM | POA: Insufficient documentation

## 2015-07-07 DIAGNOSIS — Z01810 Encounter for preprocedural cardiovascular examination: Secondary | ICD-10-CM | POA: Insufficient documentation

## 2015-07-07 MED ORDER — TECHNETIUM TC 99M SESTAMIBI - CARDIOLITE
29.1100 | Freq: Once | INTRAVENOUS | Status: AC | PRN
  Administered 2015-07-07: 13:00:00 29.11 via INTRAVENOUS

## 2015-07-08 LAB — NM MYOCAR MULTI W/SPECT W/WALL MOTION / EF
CHL CUP NUCLEAR SDS: 6
CHL CUP NUCLEAR SRS: 6
CHL CUP NUCLEAR SSS: 10
CSEPED: 1 min
CSEPPHR: 75 {beats}/min
Estimated workload: 1 METS
Exercise duration (sec): 5 s
LV dias vol: 141 mL (ref 62–150)
LVSYSVOL: 71 mL
MPHR: 147 {beats}/min
Percent HR: 51 %
Rest HR: 63 {beats}/min
TID: 1.07

## 2015-07-09 ENCOUNTER — Encounter: Payer: Medicare Other | Admitting: Physical Therapy

## 2015-07-13 ENCOUNTER — Encounter: Payer: Medicare Other | Admitting: Physical Therapy

## 2015-07-16 ENCOUNTER — Encounter: Payer: Medicare Other | Admitting: Physical Therapy

## 2015-07-20 ENCOUNTER — Encounter: Payer: Medicare Other | Admitting: Physical Therapy

## 2015-07-23 ENCOUNTER — Encounter: Payer: Medicare Other | Admitting: Physical Therapy

## 2015-07-27 ENCOUNTER — Encounter: Payer: Medicare Other | Admitting: Physical Therapy

## 2015-07-30 ENCOUNTER — Encounter: Payer: Medicare Other | Admitting: Physical Therapy

## 2015-08-06 ENCOUNTER — Encounter: Payer: Medicare Other | Admitting: Physical Therapy

## 2016-03-22 ENCOUNTER — Encounter (INDEPENDENT_AMBULATORY_CARE_PROVIDER_SITE_OTHER): Payer: Self-pay | Admitting: Vascular Surgery

## 2016-03-22 ENCOUNTER — Ambulatory Visit (INDEPENDENT_AMBULATORY_CARE_PROVIDER_SITE_OTHER): Payer: Medicare Other | Admitting: Vascular Surgery

## 2016-03-22 VITALS — BP 144/80 | HR 60 | Resp 18 | Ht 68.0 in | Wt 391.0 lb

## 2016-03-22 DIAGNOSIS — I890 Lymphedema, not elsewhere classified: Secondary | ICD-10-CM

## 2016-03-22 DIAGNOSIS — E669 Obesity, unspecified: Secondary | ICD-10-CM | POA: Insufficient documentation

## 2016-03-22 DIAGNOSIS — M7989 Other specified soft tissue disorders: Secondary | ICD-10-CM

## 2016-03-22 NOTE — Progress Notes (Signed)
MRN : [id number]  Jay Sandoval is a 75 y.o. ([birth year]) male who presents with chief complaint of  Chief Complaint  Patient presents with  . Re-evaluation    2 month follow up  .  History of Present Illness: Patient returns today in follow up of Of his leg swelling and lymphedema. He just got his compression stockings today. He has been using his lymphedema pump and his legs are less swollen today. The right in particular looked significantly better although some swelling still present. He does not have ulceration or infection. He has no fever or chills.  Current Outpatient Prescriptions  Medication Sig Dispense Refill  . albuterol (PROVENTIL) (2.5 MG/3ML) 0.083% nebulizer solution Inhale into the lungs.    Marland Kitchen allopurinol (ZYLOPRIM) 100 MG tablet Take by mouth.    Marland Kitchen amLODipine-atorvastatin (CADUET) 5-10 MG tablet     . Ascorbic Acid (VITAMIN C CR) 500 MG CPCR Take by mouth.    Marland Kitchen atenolol (TENORMIN) 100 MG tablet     . benazepril (LOTENSIN) 40 MG tablet TAKE ONE TABLET BY MOUTH ONCE DAILY    . cephALEXin (KEFLEX) 500 MG capsule Take 1 capsule (500 mg total) by mouth 4 (four) times daily. 28 capsule 0  . Coenzyme Q10 100 MG capsule Take by mouth.    . DOCOSAHEXAENOIC ACID PO Take by mouth.    . Fluticasone-Salmeterol (ADVAIR DISKUS) 250-50 MCG/DOSE AEPB INHALE ONE DOSE BY MOUTH TWICE DAILY. RINSE MOUTH AFTER USE.    . furosemide (LASIX) 80 MG tablet Take by mouth.    . hydrALAZINE (APRESOLINE) 50 MG tablet TAKE ONE TABLET BY MOUTH THREE TIMES DAILY    . metolazone (ZAROXOLYN) 2.5 MG tablet     . nystatin cream (MYCOSTATIN)     . omeprazole (PRILOSEC) 20 MG capsule Take by mouth.    . oxyCODONE (ROXICODONE) 5 MG immediate release tablet Take 1 tablet (5 mg total) by mouth every 6 (six) hours as needed for moderate pain. Do not drive while taking this medication. 10 tablet 0  . potassium chloride (K-DUR) 10 MEQ tablet TAKE ONE TABLET BY MOUTH ONCE DAILY    . pregabalin (LYRICA) 50 MG  capsule TAKE ONE CAPSULE BY MOUTH TWICE DAILY    . PROAIR HFA 108 (90 Base) MCG/ACT inhaler     . RESTASIS 0.05 % ophthalmic emulsion     . senna-docusate (SENOKOT-S) 8.6-50 MG tablet Take by mouth.    . terazosin (HYTRIN) 2 MG capsule Take by mouth.    Marland Kitchen tiZANidine (ZANAFLEX) 4 MG tablet     . traMADol (ULTRAM) 50 MG tablet TAKE ONE TABLET BY MOUTH TWICE DAILY AS NEEDED    . triamcinolone cream (KENALOG) 0.1 % APPLY  CREAM EXTERNALLY TWICE DAILY     No current facility-administered medications for this visit.     Past Medical History:  Diagnosis Date  . Arthritis   . Asthma   . Hypertension   . Osteoporosis     Past Surgical History:  Procedure Laterality Date  . KNEE ARTHROPLASTY    . SHOULDER ARTHROSCOPY DISTAL CLAVICLE EXCISION AND OPEN ROTATOR CUFF REPAIR      Social History Social History  Substance Use Topics  . Smoking status: Former Smoker    Quit date: 05/15/2015  . Smokeless tobacco: Not on file  . Alcohol use No     Family History No bleeding or clotting disorders  No Known Allergies   REVIEW OF SYSTEMS (Negative unless checked)  Constitutional: [] Weight  loss  [] Fever  [] Chills Cardiac: [] Chest pain   [] Chest pressure   [] Palpitations   [] Shortness of breath when laying flat   [] Shortness of breath at rest   [] Shortness of breath with exertion. Vascular:  [] Pain in legs with walking   [] Pain in legs at rest   [] Pain in legs when laying flat   [] Claudication   [] Pain in feet when walking  [] Pain in feet at rest  [] Pain in feet when laying flat   [] History of DVT   [] Phlebitis   [x] Swelling in legs   [] Varicose veins   [] Non-healing ulcers Pulmonary:   [] Uses home oxygen   [] Productive cough   [] Hemoptysis   [] Wheeze  [] COPD   [] Asthma Neurologic:  [] Dizziness  [] Blackouts   [] Seizures   [] History of stroke   [] History of TIA  [] Aphasia   [] Temporary blindness   [] Dysphagia   [] Weakness or numbness in arms   [] Weakness or numbness in legs Musculoskeletal:   [] Arthritis   [] Joint swelling   [x] Joint pain   [] Low back pain Hematologic:  [] Easy bruising  [] Easy bleeding   [] Hypercoagulable state   [] Anemic   Gastrointestinal:  [] Blood in stool   [] Vomiting blood  [] Gastroesophageal reflux/heartburn   [] Abdominal pain Genitourinary:  [] Chronic kidney disease   [] Difficult urination  [] Frequent urination  [] Burning with urination   [] Hematuria Skin:  [] Rashes   [] Ulcers   [] Wounds Psychological:  [] History of anxiety   []  History of major depression.  Physical Examination  BP (!) 144/80 (BP Location: Left Arm)   Pulse 60   Resp 18   Ht 5\' 8"  (1.727 m)   Wt (!) 391 lb (177.4 kg)   BMI 59.45 kg/m  Gen:  WD/WN, NAD. Massively obese Head: /AT, No temporalis wasting. Ear/Nose/Throat: Hearing grossly intact, nares w/o erythema or drainage, trachea midline Eyes: Conjunctiva clear. Sclera non-icteric Neck: Supple.  No JVD.  Pulmonary:  Good air movement, no use of accessory muscles.  Cardiac: RRR, normal S1, S2 Vascular:  Vessel Right Left  Radial Palpable Palpable  Ulnar Palpable Palpable  Brachial Palpable Palpable  Carotid Palpable, without bruit Palpable, without bruit  Aorta Not palpable N/A  Femoral Palpable Palpable  Popliteal Palpable Palpable  PT Trace Palpable Not Palpable  DP 1+ Palpable 1+ Palpable   Gastrointestinal: soft, non-tender/non-distended. No guarding/reflex.  Musculoskeletal: Uses a hovaround.  No deformity or atrophy. 1+ RLE edema, 2-3+ LLE edema. Neurologic: Sensation grossly intact in extremities.  Symmetrical.  Speech is fluent.  Psychiatric: Judgment intact, Mood & affect appropriate for pt's clinical situation. Dermatologic: No rashes or ulcers noted.  No cellulitis or open wounds. Lymph : No Cervical, Axillary, or Inguinal lymphadenopathy.      Labs No results found for this or any previous visit (from the past 2160 hour(s)).  Radiology No results found.    Assessment/Plan  Severe obesity (BMI  >= 40) (HCC) Definitely worsens his lower extremity swelling. Weight loss would benefit his legs.  Swelling of limb Continue the use of his lymphedema pump. Needs to use his compression stockings daily. Weight loss, elevation, and exercise will be of benefit.  Lymphedema Continue the use of his lymphedema pump. Needs to use his compression stockings daily. Weight loss, elevation, and exercise will be of benefit.    , MD  03/22/2016 3:01 PM    This note was created with Dragon medical transcription system.  Any errors from dictation are purely unintentional

## 2016-03-22 NOTE — Assessment & Plan Note (Signed)
Continue the use of his lymphedema pump. Needs to use his compression stockings daily. Weight loss, elevation, and exercise will be of benefit.

## 2016-03-22 NOTE — Assessment & Plan Note (Signed)
Definitely worsens his lower extremity swelling. Weight loss would benefit his legs.

## 2016-03-22 NOTE — Assessment & Plan Note (Signed)
Continue the use of his lymphedema pump. Needs to use his compression stockings daily. Weight loss, elevation, and exercise will be of benefit. 

## 2016-09-20 ENCOUNTER — Ambulatory Visit (INDEPENDENT_AMBULATORY_CARE_PROVIDER_SITE_OTHER): Payer: Medicare Other | Admitting: Vascular Surgery

## 2017-09-07 ENCOUNTER — Ambulatory Visit: Payer: Medicare Other | Attending: Nurse Practitioner | Admitting: Nurse Practitioner

## 2017-09-07 ENCOUNTER — Ambulatory Visit
Admission: RE | Admit: 2017-09-07 | Discharge: 2017-09-07 | Disposition: A | Discharge status: Home / Self Care | Payer: Medicare Other | Source: Ambulatory Visit | Attending: Nurse Practitioner | Admitting: Nurse Practitioner

## 2017-09-07 ENCOUNTER — Encounter: Payer: Self-pay | Admitting: Nurse Practitioner

## 2017-09-07 ENCOUNTER — Other Ambulatory Visit: Payer: Self-pay

## 2017-09-07 DIAGNOSIS — Z87891 Personal history of nicotine dependence: Secondary | ICD-10-CM | POA: Insufficient documentation

## 2017-09-07 DIAGNOSIS — M795 Residual foreign body in soft tissue: Secondary | ICD-10-CM | POA: Insufficient documentation

## 2017-09-07 DIAGNOSIS — M810 Age-related osteoporosis without current pathological fracture: Secondary | ICD-10-CM | POA: Insufficient documentation

## 2017-09-07 DIAGNOSIS — I509 Heart failure, unspecified: Secondary | ICD-10-CM | POA: Insufficient documentation

## 2017-09-07 DIAGNOSIS — J449 Chronic obstructive pulmonary disease, unspecified: Secondary | ICD-10-CM | POA: Insufficient documentation

## 2017-09-07 DIAGNOSIS — Z6841 Body Mass Index (BMI) 40.0 and over, adult: Secondary | ICD-10-CM | POA: Diagnosis not present

## 2017-09-07 DIAGNOSIS — M19032 Primary osteoarthritis, left wrist: Secondary | ICD-10-CM | POA: Diagnosis not present

## 2017-09-07 DIAGNOSIS — Z7951 Long term (current) use of inhaled steroids: Secondary | ICD-10-CM | POA: Insufficient documentation

## 2017-09-07 DIAGNOSIS — L409 Psoriasis, unspecified: Secondary | ICD-10-CM | POA: Diagnosis not present

## 2017-09-07 DIAGNOSIS — M25562 Pain in left knee: Secondary | ICD-10-CM

## 2017-09-07 DIAGNOSIS — G4730 Sleep apnea, unspecified: Secondary | ICD-10-CM | POA: Insufficient documentation

## 2017-09-07 DIAGNOSIS — I878 Other specified disorders of veins: Secondary | ICD-10-CM | POA: Diagnosis not present

## 2017-09-07 DIAGNOSIS — M25512 Pain in left shoulder: Secondary | ICD-10-CM

## 2017-09-07 DIAGNOSIS — M25561 Pain in right knee: Secondary | ICD-10-CM | POA: Diagnosis not present

## 2017-09-07 DIAGNOSIS — I890 Lymphedema, not elsewhere classified: Secondary | ICD-10-CM | POA: Diagnosis not present

## 2017-09-07 DIAGNOSIS — I10 Essential (primary) hypertension: Secondary | ICD-10-CM | POA: Insufficient documentation

## 2017-09-07 DIAGNOSIS — E785 Hyperlipidemia, unspecified: Secondary | ICD-10-CM | POA: Insufficient documentation

## 2017-09-07 DIAGNOSIS — M19012 Primary osteoarthritis, left shoulder: Secondary | ICD-10-CM | POA: Diagnosis not present

## 2017-09-07 DIAGNOSIS — G4733 Obstructive sleep apnea (adult) (pediatric): Secondary | ICD-10-CM | POA: Insufficient documentation

## 2017-09-07 DIAGNOSIS — M25532 Pain in left wrist: Secondary | ICD-10-CM | POA: Diagnosis not present

## 2017-09-07 DIAGNOSIS — M545 Low back pain, unspecified: Secondary | ICD-10-CM | POA: Insufficient documentation

## 2017-09-07 DIAGNOSIS — G894 Chronic pain syndrome: Secondary | ICD-10-CM | POA: Diagnosis not present

## 2017-09-07 DIAGNOSIS — I110 Hypertensive heart disease with heart failure: Secondary | ICD-10-CM | POA: Diagnosis not present

## 2017-09-07 DIAGNOSIS — M069 Rheumatoid arthritis, unspecified: Secondary | ICD-10-CM | POA: Insufficient documentation

## 2017-09-07 DIAGNOSIS — M5136 Other intervertebral disc degeneration, lumbar region: Secondary | ICD-10-CM | POA: Insufficient documentation

## 2017-09-07 DIAGNOSIS — Z789 Other specified health status: Secondary | ICD-10-CM | POA: Diagnosis not present

## 2017-09-07 DIAGNOSIS — K219 Gastro-esophageal reflux disease without esophagitis: Secondary | ICD-10-CM | POA: Diagnosis not present

## 2017-09-07 DIAGNOSIS — G8929 Other chronic pain: Secondary | ICD-10-CM

## 2017-09-07 DIAGNOSIS — M19011 Primary osteoarthritis, right shoulder: Secondary | ICD-10-CM | POA: Insufficient documentation

## 2017-09-07 DIAGNOSIS — Z79899 Other long term (current) drug therapy: Secondary | ICD-10-CM | POA: Diagnosis not present

## 2017-09-07 DIAGNOSIS — M899 Disorder of bone, unspecified: Secondary | ICD-10-CM | POA: Insufficient documentation

## 2017-09-07 DIAGNOSIS — M25511 Pain in right shoulder: Secondary | ICD-10-CM | POA: Diagnosis not present

## 2017-09-07 DIAGNOSIS — M79642 Pain in left hand: Secondary | ICD-10-CM

## 2017-09-07 DIAGNOSIS — Z79891 Long term (current) use of opiate analgesic: Secondary | ICD-10-CM | POA: Insufficient documentation

## 2017-09-07 DIAGNOSIS — M170 Bilateral primary osteoarthritis of knee: Secondary | ICD-10-CM | POA: Insufficient documentation

## 2017-09-07 DIAGNOSIS — J45909 Unspecified asthma, uncomplicated: Secondary | ICD-10-CM | POA: Insufficient documentation

## 2017-09-07 NOTE — Patient Instructions (Addendum)
You will have med/psych evaluation prior to next appointment at pain clinic.  You will have XRays done, as ordered, prior to next appt. at pain clinic.____________________________________________________________________________________________  Appointment Policy Summary  It is our goal and responsibility to provide the medical community with assistance in the evaluation and management of patients with chronic pain. Unfortunately our resources are limited. Because we do not have an unlimited amount of time, or available appointments, we are required to closely monitor and manage their use. The following rules exist to maximize their use:  Patient's responsibilities: 1. Punctuality:  At what time should I arrive? You should be physically present in our office 30 minutes before your scheduled appointment. Your scheduled appointment is with your assigned healthcare provider. However, it takes 5-10 minutes to be "checked-in", and another 15 minutes for the nurses to do the admission. If you arrive to our office at the time you were given for your appointment, you will end up being at least 20-25 minutes late to your appointment with the provider. 2. Tardiness:  What happens if I arrive only a few minutes after my scheduled appointment time? You will need to reschedule your appointment. The cutoff is your appointment time. This is why it is so important that you arrive at least 30 minutes before that appointment. If you have an appointment scheduled for 10:00 AM and you arrive at 10:01, you will be required to reschedule your appointment.  3. Plan ahead:  Always assume that you will encounter traffic on your way in. Plan for it. If you are dependent on a driver, make sure they understand these rules and the need to arrive early. 4. Other appointments and responsibilities:  Avoid scheduling any other appointments before or after your pain clinic appointments.  5. Be prepared:  Write down everything that  you need to discuss with your healthcare provider and give this information to the admitting nurse. Write down the medications that you will need refilled. Bring your pills and bottles (even the empty ones), to all of your appointments, except for those where a procedure is scheduled. 6. No children or pets:  Find someone to take care of them. It is not appropriate to bring them in. 7. Scheduling changes:  We request "advanced notification" of any changes or cancellations. 8. Advanced notification:  Defined as a time period of more than 24 hours prior to the originally scheduled appointment. This allows for the appointment to be offered to other patients. 9. Rescheduling:  When a visit is rescheduled, it will require the cancellation of the original appointment. For this reason they both fall within the category of "Cancellations".  10. Cancellations:  They require advanced notification. Any cancellation less than 24 hours before the  appointment will be recorded as a "No Show". 11. No Show:  Defined as an unkept appointment where the patient failed to notify or declare to the practice their intention or inability to keep the appointment.  Corrective process for repeat offenders:  1. Tardiness: Three (3) episodes of rescheduling due to late arrivals will be recorded as one (1) "No Show". 2. Cancellation or reschedule: Three (3) cancellations or rescheduling will be recorded as one (1) "No Show". 3. "No Shows": Three (3) "No Shows" within a 12 month period will result in discharge from the practice. ____________________________________________________________________________________________  ____________________________________________________________________________________________  Pain Scale  Introduction: The pain score used by this practice is the Verbal Numerical Rating Scale (VNRS-11). This is an 11-point scale. It is for adults and children 10  years or older. There are significant  differences in how the pain score is reported, used, and applied. Forget everything you learned in the past and learn this scoring system.  General Information: The scale should reflect your current level of pain. Unless you are specifically asked for the level of your worst pain, or your average pain. If you are asked for one of these two, then it should be understood that it is over the past 24 hours.  Basic Activities of Daily Living (ADL): Personal hygiene, dressing, eating, transferring, and using restroom.  Instructions: Most patients tend to report their level of pain as a combination of two factors, their physical pain and their psychosocial pain. This last one is also known as "suffering" and it is reflection of how physical pain affects you socially and psychologically. From now on, report them separately. From this point on, when asked to report your pain level, report only your physical pain. Use the following table for reference.  Pain Clinic Pain Levels (0-5/10)  Pain Level Score  Description  No Pain 0   Mild pain 1 Nagging, annoying, but does not interfere with basic activities of daily living (ADL). Patients are able to eat, bathe, get dressed, toileting (being able to get on and off the toilet and perform personal hygiene functions), transfer (move in and out of bed or a chair without assistance), and maintain continence (able to control bladder and bowel functions). Blood pressure and heart rate are unaffected. A normal heart rate for a healthy adult ranges from 60 to 100 bpm (beats per minute).   Mild to moderate pain 2 Noticeable and distracting. Impossible to hide from other people. More frequent flare-ups. Still possible to adapt and function close to normal. It can be very annoying and may have occasional stronger flare-ups. With discipline, patients may get used to it and adapt.   Moderate pain 3 Interferes significantly with activities of daily living (ADL). It becomes  difficult to feed, bathe, get dressed, get on and off the toilet or to perform personal hygiene functions. Difficult to get in and out of bed or a chair without assistance. Very distracting. With effort, it can be ignored when deeply involved in activities.   Moderately severe pain 4 Impossible to ignore for more than a few minutes. With effort, patients may still be able to manage work or participate in some social activities. Very difficult to concentrate. Signs of autonomic nervous system discharge are evident: dilated pupils (mydriasis); mild sweating (diaphoresis); sleep interference. Heart rate becomes elevated (>115 bpm). Diastolic blood pressure (lower number) rises above 100 mmHg. Patients find relief in laying down and not moving.   Severe pain 5 Intense and extremely unpleasant. Associated with frowning face and frequent crying. Pain overwhelms the senses.  Ability to do any activity or maintain social relationships becomes significantly limited. Conversation becomes difficult. Pacing back and forth is common, as getting into a comfortable position is nearly impossible. Pain wakes you up from deep sleep. Physical signs will be obvious: pupillary dilation; increased sweating; goosebumps; brisk reflexes; cold, clammy hands and feet; nausea, vomiting or dry heaves; loss of appetite; significant sleep disturbance with inability to fall asleep or to remain asleep. When persistent, significant weight loss is observed due to the complete loss of appetite and sleep deprivation.  Blood pressure and heart rate becomes significantly elevated. Caution: If elevated blood pressure triggers a pounding headache associated with blurred vision, then the patient should immediately seek attention at an urgent or  emergency care unit, as these may be signs of an impending stroke.    Emergency Department Pain Levels (6-10/10)  Emergency Room Pain 6 Severely limiting. Requires emergency care and should not be seen or  managed at an outpatient pain management facility. Communication becomes difficult and requires great effort. Assistance to reach the emergency department may be required. Facial flushing and profuse sweating along with potentially dangerous increases in heart rate and blood pressure will be evident.   Distressing pain 7 Self-care is very difficult. Assistance is required to transport, or use restroom. Assistance to reach the emergency department will be required. Tasks requiring coordination, such as bathing and getting dressed become very difficult.   Disabling pain 8 Self-care is no longer possible. At this level, pain is disabling. The individual is unable to do even the most "basic" activities such as walking, eating, bathing, dressing, transferring to a bed, or toileting. Fine motor skills are lost. It is difficult to think clearly.   Incapacitating pain 9 Pain becomes incapacitating. Thought processing is no longer possible. Difficult to remember your own name. Control of movement and coordination are lost.   The worst pain imaginable 10 At this level, most patients pass out from pain. When this level is reached, collapse of the autonomic nervous system occurs, leading to a sudden drop in blood pressure and heart rate. This in turn results in a temporary and dramatic drop in blood flow to the brain, leading to a loss of consciousness. Fainting is one of the body's self defense mechanisms. Passing out puts the brain in a calmed state and causes it to shut down for a while, in order to begin the healing process.    Summary: 1. Refer to this scale when providing [place] with your pain level. 2. Be accurate and careful when reporting your pain level. This will help with your care. 3. Over-reporting your pain level will lead to loss of credibility. 4. Even a level of 1/10 means that there is pain and will be treated at our facility. 5. High, inaccurate reporting will be documented as "Symptom  Exaggeration", leading to loss of credibility and suspicions of possible secondary gains such as obtaining more narcotics, or wanting to appear disabled, for fraudulent reasons. 6. Only pain levels of 5 or below will be seen at our facility. 7. Pain levels of 6 and above will be sent to the Emergency Department and the appointment cancelled. ____________________________________________________________________________________________

## 2017-09-07 NOTE — Progress Notes (Signed)
Patient's Name: Jay Sandoval  MRN: [id number]  Referring Provider: Tracie Harrier, MD  DOB: 1940/05/18  PCP: Tracie Harrier, MD  DOS: 09/07/2017  Note by: Dionisio David NP  Service setting: Ambulatory outpatient  Specialty: Interventional Pain Management  Location: [facility] (AMB) Pain Management Facility    Patient type: New Patient    Primary Reason(s) for Visit: Initial Patient Evaluation CC: Knee Pain (bilaterally); Joint Pain (all over); Back Pain (lower); and Shoulder Pain (bilaterally)  HPI  Jay Sandoval is a 77 y.o. year old, male patient, who comes today for an initial evaluation. He has DDD (degenerative disc disease), lumbar; Obesity; Swelling of limb; Lymphedema; Asthma without status asthmaticus; Back muscle spasm; Chest pain with high risk for cardiac etiology; Chronic lower back pain; Congestive heart failure (HCC); COPD (chronic obstructive pulmonary disease) ([place]); GERD (gastroesophageal reflux disease); Hyperlipidemia, unspecified; Osteoarthritis of knees, bilateral; Preop cardiovascular exam; Psoriasis; Sleep apnea; SOB (shortness of breath) on exertion; Hypertension; Dependence on continuous positive airway pressure ventilation; Venous stasis; Asthma; Benign hypertension; Hypercholesterolemia; Obstructive sleep apnea syndrome; Chronic pain of both knees (Primary Area of Pain)(R>L); Chronic pain of both shoulders  (Secondary Area of Pain)(R>L); Chronic bilateral low back pain without sciatica [facility] Area of Pain)(R>L); Wrist pain, chronic, left (Fourth Area of Pain); Chronic hand pain, left; Chronic pain syndrome; Long term current use of opiate analgesic; Pharmacologic therapy; Disorder of skeletal system; and Problems influencing health status on their problem list.. His primarily concern today is the Knee Pain (bilaterally); Joint Pain (all over); Back Pain (lower); and Shoulder Pain (bilaterally)  Pain Assessment: Location: Right, Left Knee Radiating: denies Onset: More than a  month ago Duration: Chronic pain Quality: Aching, Constant Severity: 9 /10 (subjective, self-reported pain score)  Note: Reported level is compatible with observation. Clinically the patient looks like a 3/10 A 3/10 is viewed as "Moderate" and described as significantly interfering with activities of daily living (ADL). It becomes difficult to feed, bathe, get dressed, get on and off the toilet or to perform personal hygiene functions. Difficult to get in and out of bed or a chair without assistance. Very distracting. With effort, it can be ignored when deeply involved in activities. Information on the proper use of the pain scale provided to the patient today. When using our objective Pain Scale, levels between 6 and 10/10 are said to belong in an emergency room, as it progressively worsens from a 6/10, described as severely limiting, requiring emergency care not usually available at an outpatient pain management facility. At a 6/10 level, communication becomes difficult and requires great effort. Assistance to reach the emergency department may be required. Facial flushing and profuse sweating along with potentially dangerous increases in heart rate and blood pressure will be evident.  Timing: Constant Modifying factors: Tamadol BP: (!) 91/58  HR: 61  Onset and Duration: Date of onset: 30 years ago Cause of pain: Work related accident or event,was hit in the head with metal Severity: Getting worse, NAS-11 at its worse: 10/10, NAS-11 at its best: 7/10, NAS-11 now: 10/10 and NAS-11 on the average: 8/10 Timing: Not influenced by the time of the day and During activity or exercise Aggravating Factors: Bending and Motion Alleviating Factors: Medications Associated Problems: Spasms, Pain that wakes patient up and Pain that does not allow patient to sleep Quality of Pain: Agonizing, Nagging, Pressure-like, Sharp, Shooting, Sickening, Stabbing, Tingling, Tiring and Uncomfortable Previous Examinations or  Tests: CT scan, MRI scan, X-rays, Neurological evaluation and Orthopedic evaluation Previous Treatments: Narcotic medications  The patient comes into the clinics today for the first time for a chronic pain management evaluation. According to the patient's primary area of pain is in his knees. He admits that the right is greater than the left. He admits that he has weakness tenderness along with swelling. He denies any previous surgery He did fall in 1991 and had I&D . He admits that he has had interventional therapy steroid injections by orthopedic and primary care which were effective. He admits that physical therapy was not effective. He denies any recent images.  His second area of pain is in his shoulders with the left being greater than the right. He admits that he dislocated his left shoulder in 1990 and had surgery [place]. He has had steroid injections to his right shoulder which was not effective. He admits that physical therapy is not effective. He denies any recent images.  History area of pain is in his lower back. He admits the right side is greater than the left. He admits this all stemmed from a fall in 1989. He denies any previous surgery Denies any previous interventional therapy. He admits that physical therapy was not effective because he only had 2 or 3 visits secondary to insurance He denies any recent images.   His fourth area of pain is in his left wrist. He admits that is a throbbing aching pain. He denies any previous injury. He denies any previous surgery. He has had steroid injections which were not effective by primary care. He denies any recent images.  Fifth area of pain is in his left fifth finger. He denies any previous injury, surgery, interventional therapy or physical therapy for his finger. He denies any x-rays.  He admits that he currently taking tramadol along with Tylenol arthritis which is not effective for his pain.  Today I took the time to provide the  patient with information regarding this pain practice. The patient was informed that the practice is divided into two sections: an interventional pain management section, as well as a completely separate and distinct medication management section. I explained that there are procedure days for interventional therapies, and evaluation days for follow-ups and medication management. Because of the amount of documentation required during both, they are kept separated. This means that there is the possibility that hemay be scheduled for a procedure on one day, and medication management the next. I have also informed him that because of staffing and facility limitations, this practice will no longer take patients for medication management only. To illustrate the reasons for this, I gave the patient the example of surgeons, and how inappropriate it would be to refer a patient to his/her care, just to write for the post-surgical antibiotics on a surgery done by a different surgeon.   Because interventional pain management is part of the board-certified specialty for the doctors, the patient was informed that joining this practice means that they are open to any and all interventional therapies. I made it clear that this does not mean that they will be forced to have any procedures done. What this means is that I believe interventional therapies to be essential part of the diagnosis and proper management of chronic pain conditions. Therefore, patients not interested in these interventional alternatives will be better served under the care of a different practitioner.  The patient was also made aware of my Comprehensive Pain Management Safety Guidelines where by joining this practice, they limit all of their nerve blocks and joint injections to those  done by our practice, for as long as we are retained to manage their care. Historic Controlled Substance Pharmacotherapy Review  PMP and historical list of controlled  substances: tramadol 50 mg, Lyrica 75 mg, Lyrica 50 mg, oxycodone/acetaminophen 5/325 mg, hydrocodone/acetaminophen 5/325 mg, Highest opioid analgesic regimen found: hydrocodone/cetaminophen one tablet 4 times daily (fill date 12/21/2012) hydrocodone 20 mg per day Most recent opioid analgesic: tramadol 50 mg twice daily(last fill date 08/27/2017) tramadol 100 mg per day Current opioid analgesics: tramadol 50 mg twice daily (last fill date 08/27/2017) tramadol 100 mg per day Highest recorded MME/day: 20 mg/day MME/day: 33m/day Medications: The patient did not bring the medication(s) to the appointment, as requested in our "New Patient Package" Pharmacodynamics: Desired effects: Analgesia: The patient reports >50% benefit. Reported improvement in function: The patient reports medication allows him to accomplish basic ADLs. Clinically meaningful improvement in function (CMIF): Sustained CMIF goals met Perceived effectiveness: Described as relatively effective, allowing for increase in activities of daily living (ADL) Undesirable effects: Side-effects or Adverse reactions: None reported Historical Monitoring: The patient  reports that he does not use drugs. List of all UDS Test(s): No results found for: MDMA, COCAINSCRNUR, PCPSCRNUR, PCPQUANT, CANNABQUANT, THCU, EGrahamList of all Serum Drug Screening Test(s):  No results found for: AMPHSCRSER, BARBSCRSER, BENZOSCRSER, COCAINSCRSER, PCPSCRSER, PCPQUANT, THCSCRSER, CANNABQUANT, OPIATESCRSER, OXYSCRSER, PROPOXSCRSER Historical Background Evaluation: [place] PDMP: Six (6) year initial data search conducted.             [place] Department of public safety, offender search: (Editor, commissioningInformation) Non-contributory Risk Assessment Profile: Aberrant behavior: None observed or detected today Risk factors for fatal opioid overdose: None identified today Fatal overdose hazard ratio (HR): Calculation deferred Non-fatal overdose hazard ratio (HR): Calculation  deferred Risk of opioid abuse or dependence: 0.7-3.0% with doses ? 36 MME/day and 6.1-26% with doses ? 120 MME/day. Substance use disorder (SUD) risk level: Pending results of Medical Psychology Evaluation for SUD Opioid risk tool (ORT) (Total Score): 0  ORT Scoring interpretation table:  Score <3 = Low Risk for SUD  Score between 4-7 = Moderate Risk for SUD  Score >8 = High Risk for Opioid Abuse   PHQ-2 Depression Scale:  Total score: 0  PHQ-2 Scoring interpretation table: (Score and probability of major depressive disorder)  Score 0 = No depression  Score 1 = 15.4% Probability  Score 2 = 21.1% Probability  Score 3 = 38.4% Probability  Score 4 = 45.5% Probability  Score 5 = 56.4% Probability  Score 6 = 78.6% Probability   PHQ-9 Depression Scale:  Total score: 0  PHQ-9 Scoring interpretation table:  Score 0-4 = No depression  Score 5-9 = Mild depression  Score 10-14 = Moderate depression  Score 15-19 = Moderately severe depression  Score 20-27 = Severe depression (2.4 times higher risk of SUD and 2.89 times higher risk of overuse)   Pharmacologic Plan: Pending ordered tests and/or consults  Meds  The patient has a current medication list which includes the following prescription(s): acetaminophen, albuterol, allopurinol, amlodipine-atorvastatin, vitamin c cr, atenolol, benazepril, clobetasol ointment, coenzyme q10, fluticasone, fluticasone-salmeterol, hydralazine, hydrocortisone, metolazone, mometasone, multiple vitamins-minerals, nystatin cream, omega-3 acid ethyl esters, omega-3 fish oil, omeprazole, potassium chloride, pregabalin, proair hfa, restasis, senna-docusate, tiotropium, tizanidine, torsemide, torsemide, tramadol, triamcinolone cream, cephalexin, docosahexaenoic acid, furosemide, oxycodone, and terazosin.  Current Outpatient Medications on File Prior to Visit  Medication Sig  . Acetaminophen (ARTHRITIS PAIN PO) Take by mouth.  .Marland Kitchenalbuterol (PROVENTIL) (2.5 MG/3ML)  0.083% nebulizer solution Inhale into the lungs.  .Marland Kitchen  allopurinol (ZYLOPRIM) 100 MG tablet Take by mouth.  Marland Kitchen amLODipine-atorvastatin (CADUET) 5-10 MG tablet   . Ascorbic Acid (VITAMIN C CR) 500 MG CPCR Take by mouth.  Marland Kitchen atenolol (TENORMIN) 100 MG tablet   . benazepril (LOTENSIN) 40 MG tablet TAKE ONE TABLET BY MOUTH ONCE DAILY  . clobetasol ointment (TEMOVATE) 6.46 % Apply 1 application topically 2 (two) times daily.  . Coenzyme Q10 100 MG capsule Take by mouth.  . fluticasone (FLONASE) 50 MCG/ACT nasal spray Place into both nostrils daily.  . Fluticasone-Salmeterol (ADVAIR DISKUS) 250-50 MCG/DOSE AEPB INHALE ONE DOSE BY MOUTH TWICE DAILY. RINSE MOUTH AFTER USE.  . hydrALAZINE (APRESOLINE) 50 MG tablet TAKE ONE TABLET BY MOUTH THREE TIMES DAILY  . hydrocortisone 2.5 % cream Apply topically 2 (two) times daily.  . metolazone (ZAROXOLYN) 2.5 MG tablet   . mometasone (NASONEX) 50 MCG/ACT nasal spray Place 2 sprays into the nose daily.  . Multiple Vitamins-Minerals (MULTIVITAL PO) Take 1 tablet by mouth.  . nystatin cream (MYCOSTATIN)   . omega-3 acid ethyl esters (LOVAZA) 1 g capsule Take by mouth 2 (two) times daily.  Marland Kitchen omega-3 fish oil (MAXEPA) 1000 MG CAPS capsule Take by mouth.  Marland Kitchen omeprazole (PRILOSEC) 20 MG capsule Take by mouth.  . potassium chloride (K-DUR) 10 MEQ tablet TAKE ONE TABLET BY MOUTH ONCE DAILY  . pregabalin (LYRICA) 50 MG capsule TAKE ONE CAPSULE BY MOUTH TWICE DAILY  . PROAIR HFA 108 (90 Base) MCG/ACT inhaler   . RESTASIS 0.05 % ophthalmic emulsion   . senna-docusate (SENOKOT-S) 8.6-50 MG tablet Take by mouth.  . tiotropium (SPIRIVA) 18 MCG inhalation capsule Place 18 mcg into inhaler and inhale daily.  Marland Kitchen tiZANidine (ZANAFLEX) 4 MG tablet   . torsemide (DEMADEX) 10 MG tablet Take 10 mg by mouth daily.  Marland Kitchen torsemide (DEMADEX) 20 MG tablet Take 20 mg by mouth daily.  . traMADol (ULTRAM) 50 MG tablet TAKE ONE TABLET BY MOUTH TWICE DAILY AS NEEDED  . triamcinolone cream  (KENALOG) 0.1 % APPLY  CREAM EXTERNALLY TWICE DAILY  . cephALEXin (KEFLEX) 500 MG capsule Take 1 capsule (500 mg total) by mouth 4 (four) times daily. (Patient not taking: Reported on 09/07/2017)  . DOCOSAHEXAENOIC ACID PO Take by mouth.  . furosemide (LASIX) 80 MG tablet Take by mouth.  . oxyCODONE (ROXICODONE) 5 MG immediate release tablet Take 1 tablet (5 mg total) by mouth every 6 (six) hours as needed for moderate pain. Do not drive while taking this medication. (Patient not taking: Reported on 09/07/2017)  . terazosin (HYTRIN) 2 MG capsule Take by mouth.   No current facility-administered medications on file prior to visit.    Imaging Review   Note: No new results found.        ROS  Cardiovascular History: Daily Aspirin intake and Weak heart (CHF) Pulmonary or Respiratory History: Wheezing and difficulty taking a deep full breath (Asthma), Shortness of breath, Snoring  and Temporary stoppage of breathing during sleep Neurological History: No reported neurological signs or symptoms such as seizures, abnormal skin sensations, urinary and/or fecal incontinence, being born with an abnormal open spine and/or a tethered spinal cord Review of Past Neurological Studies: No results found for this or any previous visit. Psychological-Psychiatric History: No reported psychological or psychiatric signs or symptoms such as difficulty sleeping, anxiety, depression, delusions or hallucinations (schizophrenial), mood swings (bipolar disorders) or suicidal ideations or attempts Gastrointestinal History: No reported gastrointestinal signs or symptoms such as vomiting or evacuating blood, reflux, heartburn, alternating  episodes of diarrhea and constipation, inflamed or scarred liver, or pancreas or irrregular and/or infrequent bowel movements Genitourinary History: No reported renal or genitourinary signs or symptoms such as difficulty voiding or producing urine, peeing blood, non-functioning kidney, kidney  stones, difficulty emptying the bladder, difficulty controlling the flow of urine, or chronic kidney disease Hematological History: No reported hematological signs or symptoms such as prolonged bleeding, low or poor functioning platelets, bruising or bleeding easily, hereditary bleeding problems, low energy levels due to low hemoglobin or being anemic Endocrine History: No reported endocrine signs or symptoms such as high or low blood sugar, rapid heart rate due to high thyroid levels, obesity or weight gain due to slow thyroid or thyroid disease Rheumatologic History: Joint aches and or swelling due to excess weight (Osteoarthritis) Musculoskeletal History: Negative for myasthenia gravis, muscular dystrophy, multiple sclerosis or malignant hyperthermia Work History: Disabled  Allergies  Mr. Bradsher has No Known Allergies.  Laboratory Chemistry  Inflammation Markers No results found for: CRP, ESRSEDRATE (CRP: Acute Phase) (ESR: Chronic Phase) Renal Function Markers Lab Results  Component Value Date   BUN 14 04/23/2015   CREATININE 1.06 04/23/2015   GFRAA >60 04/23/2015   GFRNONAA >60 04/23/2015   Hepatic Function Markers Lab Results  Component Value Date   AST 27 04/23/2015   ALT 29 04/23/2015   ALBUMIN 4.6 04/23/2015   ALKPHOS 57 04/23/2015   Electrolytes Lab Results  Component Value Date   NA 144 04/23/2015   K 3.9 04/23/2015   CL 106 04/23/2015   CALCIUM 9.7 04/23/2015   Neuropathy Markers No results found for: [id number] Bone Pathology Markers Lab Results  Component Value Date   ALKPHOS 57 04/23/2015   CALCIUM 9.7 04/23/2015   Coagulation Parameters Lab Results  Component Value Date   PLT 130 (L) 04/23/2015   Cardiovascular Markers Lab Results  Component Value Date   HGB 15.9 04/23/2015   HCT 48.2 04/23/2015   Note: Lab results reviewed.  [place]  Drug: Mr. Doughtie  reports that he does not use drugs. Alcohol:  reports that he does not drink  alcohol. Tobacco:  reports that he quit smoking about 2 years ago. He has never used smokeless tobacco. Medical:  has a past medical history of Arthritis, Asthma, Hypertension, and Osteoporosis. Family: family history is not on file.  Past Surgical History:  Procedure Laterality Date  . KNEE ARTHROPLASTY    . SHOULDER ARTHROSCOPY DISTAL CLAVICLE EXCISION AND OPEN ROTATOR CUFF REPAIR     Active Ambulatory Problems    Diagnosis Date Noted  . DDD (degenerative disc disease), lumbar 10/20/2014  . Obesity 03/22/2016  . Swelling of limb 03/22/2016  . Lymphedema 03/22/2016  . Asthma without status asthmaticus 09/07/2017  . Back muscle spasm 02/26/2015  . Chest pain with high risk for cardiac etiology 06/23/2015  . Chronic lower back pain 09/07/2017  . Congestive heart failure ([place]) 03/11/2015  . COPD (chronic obstructive pulmonary disease) ([place]) 09/07/2017  . GERD (gastroesophageal reflux disease) 09/07/2017  . Hyperlipidemia, unspecified 09/07/2017  . Osteoarthritis of knees, bilateral 11/19/2013  . Preop cardiovascular exam 07/07/2015  . Psoriasis 09/07/2017  . Sleep apnea 09/07/2017  . SOB (shortness of breath) on exertion 06/23/2015  . Hypertension 09/07/2017  . Dependence on continuous positive airway pressure ventilation 06/17/2015  . Venous stasis 03/11/2015  . Asthma 03/11/2015  . Benign hypertension 03/11/2015  . Hypercholesterolemia 03/11/2015  . Obstructive sleep apnea syndrome 03/11/2015  . Chronic pain of both knees (Primary Area of Pain)(R>L) 09/07/2017  .  Chronic pain of both shoulders  (Secondary Area of Pain)(R>L) 09/07/2017  . Chronic bilateral low back pain without sciatica [facility] Area of Pain)(R>L) 09/07/2017  . Wrist pain, chronic, left (Fourth Area of Pain) 09/07/2017  . Chronic hand pain, left 09/07/2017  . Chronic pain syndrome 09/07/2017  . Long term current use of opiate analgesic 09/07/2017  . Pharmacologic therapy 09/07/2017  . Disorder of skeletal  system 09/07/2017  . Problems influencing health status 09/07/2017   Resolved Ambulatory Problems    Diagnosis Date Noted  . High blood pressure 05/18/2015  . Osteoporosis 05/18/2015  . Rheumatoid arthritis ([place]) 05/18/2015  . Chronic bronchitis ([place]) 05/18/2015  . Circulation problem 05/18/2015   Past Medical History:  Diagnosis Date  . Arthritis   . Asthma   . Hypertension   . Osteoporosis    Constitutional Exam  General appearance: alert, cooperative and morbidly obese Vitals:   09/07/17 1322  BP: (!) 91/58  Pulse: 61  Resp: 16  Temp: 98.2 F (36.8 C)  TempSrc: Oral  SpO2: 97%  Weight: (!) 398 lb (180.5 kg)  Height: '5\' 8"'$  (1.727 m)   BMI Assessment: Estimated body mass index is 60.52 kg/m as calculated from the following:   Height as of this encounter: '5\' 8"'$  (1.727 m).   Weight as of this encounter: 398 lb (180.5 kg).  BMI interpretation table: BMI level Category Range association with higher incidence of chronic pain  <18 kg/m2 Underweight   18.5-24.9 kg/m2 Ideal body weight   25-29.9 kg/m2 Overweight Increased incidence by 20%  30-34.9 kg/m2 Obese (Class I) Increased incidence by 68%  35-39.9 kg/m2 Severe obesity (Class II) Increased incidence by 136%  >40 kg/m2 Extreme obesity (Class III) Increased incidence by 254%   BMI Readings from Last 4 Encounters:  09/07/17 60.52 kg/m  03/22/16 59.45 kg/m  04/23/15 58.84 kg/m   Wt Readings from Last 4 Encounters:  09/07/17 (!) 398 lb (180.5 kg)  03/22/16 (!) 391 lb (177.4 kg)  04/23/15 (!) 387 lb (175.5 kg)  Psych/Mental status: Alert, oriented x 3 (person, place, & time)       Eyes: PERLA Respiratory: No evidence of acute respiratory distress  Upper Extremity (UE) Exam    Side: Right upper extremity  Side: Left upper extremity  Inspection: No masses, redness, swelling, or asymmetry. No contractures  Inspection: No masses, redness, swelling, or asymmetry. No contractures  Functional ROM: Restricted ROM           Functional ROM: Restricted ROM          Muscle strength & Tone: Normal strength (5/5)  Muscle strength & Tone: Normal strength (5/5)  Sensory: Unimpaired  Sensory: Unimpaired  Palpation: No palpable anomalies              Palpation: No palpable anomalies              Specialized Test(s): Deferred         Specialized Test(s): Deferred          Thoracic Spine Exam  Inspection: No masses, redness, or swelling Alignment: Symmetrical Functional ROM: Unrestricted ROM Stability: No instability detected Sensory: Unimpaired Muscle strength & Tone: No palpable anomalies  Lumbar Spine Exam  Inspection: No masses, redness, or swelling Alignment: Symmetrical Functional ROM: Unrestricted ROM      Stability: No instability detected Muscle strength & Tone: Functionally intact Sensory: Unimpaired Palpation: Complains of area being tender to palpation       Provocative Tests: Lumbar Hyperextension and rotation  test: Unable to perform       Patrick's Maneuver: Unable to perform                    Gait & Posture Assessment  Ambulation: Patient ambulates using a wheel chair  Lower Extremity Exam    Side: Right lower extremity  Side: Left lower extremity  Inspection: Pitting edema  Inspection: Pitting edema  Functional ROM: Unrestricted ROM          Functional ROM: Unrestricted ROM          Muscle strength & Tone: Movement possible against some resistance (4/5)  Muscle strength & Tone: Movement possible, but not against gravity (2/5)  Sensory: Unimpaired  Sensory: Unimpaired  Palpation: Complains of area being tender to palpation  Palpation: Complains of area being tender to palpation   Assessment  Primary Diagnosis & Pertinent Problem List: Diagnoses of Chronic pain of both knees (Primary Area of Pain)(R>L), Chronic pain of both shoulders  (Secondary Area of Pain)(R>L), Chronic bilateral low back pain without sciatica (Tertiary Area of Pain)(R>L), Wrist pain, chronic, left (Fourth Area of  Pain), Chronic hand pain, left, Chronic pain syndrome, Long term current use of opiate analgesic, Pharmacologic therapy, Disorder of skeletal system, and Problems influencing health status were pertinent to this visit.  Visit Diagnosis: 1. Chronic pain of both knees (Primary Area of Pain)(R>L)   2. Chronic pain of both shoulders  (Secondary Area of Pain)(R>L)   3. Chronic bilateral low back pain without sciatica [facility] Area of Pain)(R>L)   4. Wrist pain, chronic, left (Fourth Area of Pain)   5. Chronic hand pain, left   6. Chronic pain syndrome   7. Long term current use of opiate analgesic   8. Pharmacologic therapy   9. Disorder of skeletal system   10. Problems influencing health status    Plan of Care  Initial treatment plan:  Please be advised that as per protocol, today's visit has been an evaluation only. We have not taken over the patient's controlled substance management.  Problem-specific plan: No problem-specific Assessment & Plan notes found for this encounter.  Ordered Lab-work, Procedure(s), Referral(s), & Consult(s): Orders Placed This Encounter  Procedures  . DG Lumbar Spine Complete W/Bend  . DG Knee 1-2 Views Left  . DG Knee 1-2 Views Right  . DG Shoulder Right  . DG Shoulder Left  . DG Wrist Complete Left  . DG Hand Complete Left  . Compliance Drug Analysis, Ur  . Comp. Metabolic Panel (12)  . Magnesium  . Vitamin B12  . Sedimentation rate  . 25-Hydroxyvitamin D Lcms D2+D3  . C-reactive protein  . Ambulatory referral to Psychology   Pharmacotherapy: Medications ordered:  No orders of the defined types were placed in this encounter.  Medications administered during this visit: Karn Cassis had no medications administered during this visit.   Pharmacotherapy under consideration:  Opioid Analgesics: The patient was informed that there is no guarantee that he would be a candidate for opioid analgesics. The decision will be made following CDC  guidelines. This decision will be based on the results of diagnostic studies, as well as Mr. Turnley risk profile.  Membrane stabilizer: To be determined at a later time Muscle relaxant: To be determined at a later time NSAID: To be determined at a later time Other analgesic(s): To be determined at a later time   Interventional therapies under consideration: Mr. Rineer was informed that there is no guarantee that he would  be a candidate for interventional therapies. The decision will be based on the results of diagnostic studies, as well as Mr. Malmstrom risk profile.  Possible procedure(s): Diagnostic bilateral intra-articular knee injections Diagnostic bilateral Hyalgan series Diagnostic bilateral genicular nerve block Possible bilateral genicular nerve RFA Diagnostic bilateral intra-articular shoulder injections Diagnostic bilateral suprascapular nerve blocks Possible bilateral suprascapular RFA Diagnostic bilateral lumbar facet nerve block Possible bilateral lumbar facet RFA Diagnostic left intra-articular wrist injection Diagnostic left hand fifth digit intra-articular joint injection   Provider-requested follow-up: Return for 2nd Visit, w/ Dr. Dossie Arbour, after [facility] eval.  No future appointments.  Primary Care Physician: Tracie Harrier, MD Location: [facility] Note by:  Date: 09/07/2017; Time: 3:28 PM  Pain Score Disclaimer: We use the NRS-11 scale. This is a self-reported, subjective measurement of pain severity with only modest accuracy. It is used primarily to identify changes within a particular patient. It must be understood that outpatient pain scales are significantly less accurate that those used for research, where they can be applied under ideal controlled circumstances with minimal exposure to variables. In reality, the score is likely to be a combination of pain intensity and pain affect, where pain affect describes the degree of  emotional arousal or changes in action readiness caused by the sensory experience of pain. Factors such as social and work situation, setting, emotional state, anxiety levels, expectation, and prior pain experience may influence pain perception and show large inter-individual differences that may also be affected by time variables.  Patient instructions provided during this appointment: Patient Instructions   You will have med/psych evaluation prior to next appointment at pain clinic.  You will have XRays done, as ordered, prior to next appt. at pain clinic.____________________________________________________________________________________________  Appointment Policy Summary  It is our goal and responsibility to provide the medical community with assistance in the evaluation and management of patients with chronic pain. Unfortunately our resources are limited. Because we do not have an unlimited amount of time, or available appointments, we are required to closely monitor and manage their use. The following rules exist to maximize their use:  Patient's responsibilities: 1. Punctuality:  At what time should I arrive? You should be physically present in our office 30 minutes before your scheduled appointment. Your scheduled appointment is with your assigned healthcare provider. However, it takes 5-10 minutes to be "checked-in", and another 15 minutes for the nurses to do the admission. If you arrive to our office at the time you were given for your appointment, you will end up being at least 20-25 minutes late to your appointment with the provider. 2. Tardiness:  What happens if I arrive only a few minutes after my scheduled appointment time? You will need to reschedule your appointment. The cutoff is your appointment time. This is why it is so important that you arrive at least 30 minutes before that appointment. If you have an appointment scheduled for 10:00 AM and you arrive at 10:01, you will be  required to reschedule your appointment.  3. Plan ahead:  Always assume that you will encounter traffic on your way in. Plan for it. If you are dependent on a driver, make sure they understand these rules and the need to arrive early. 4. Other appointments and responsibilities:  Avoid scheduling any other appointments before or after your pain clinic appointments.  5. Be prepared:  Write down everything that you need to discuss with your healthcare provider and give this information to the admitting nurse. Write down the  medications that you will need refilled. Bring your pills and bottles (even the empty ones), to all of your appointments, except for those where a procedure is scheduled. 6. No children or pets:  Find someone to take care of them. It is not appropriate to bring them in. 7. Scheduling changes:  We request "advanced notification" of any changes or cancellations. 8. Advanced notification:  Defined as a time period of more than 24 hours prior to the originally scheduled appointment. This allows for the appointment to be offered to other patients. 9. Rescheduling:  When a visit is rescheduled, it will require the cancellation of the original appointment. For this reason they both fall within the category of "Cancellations".  10. Cancellations:  They require advanced notification. Any cancellation less than 24 hours before the  appointment will be recorded as a "No Show". 11. No Show:  Defined as an unkept appointment where the patient failed to notify or declare to the practice their intention or inability to keep the appointment.  Corrective process for repeat offenders:  1. Tardiness: Three (3) episodes of rescheduling due to late arrivals will be recorded as one (1) "No Show". 2. Cancellation or reschedule: Three (3) cancellations or rescheduling will be recorded as one (1) "No Show". 3. "No Shows": Three (3) "No Shows" within a 12 month period will result in discharge from the  practice. ____________________________________________________________________________________________  ____________________________________________________________________________________________  Pain Scale  Introduction: The pain score used by this practice is the Verbal Numerical Rating Scale (VNRS-11). This is an 11-point scale. It is for adults and children 10 years or older. There are significant differences in how the pain score is reported, used, and applied. Forget everything you learned in the past and learn this scoring system.  General Information: The scale should reflect your current level of pain. Unless you are specifically asked for the level of your worst pain, or your average pain. If you are asked for one of these two, then it should be understood that it is over the past 24 hours.  Basic Activities of Daily Living (ADL): Personal hygiene, dressing, eating, transferring, and using restroom.  Instructions: Most patients tend to report their level of pain as a combination of two factors, their physical pain and their psychosocial pain. This last one is also known as "suffering" and it is reflection of how physical pain affects you socially and psychologically. From now on, report them separately. From this point on, when asked to report your pain level, report only your physical pain. Use the following table for reference.  Pain Clinic Pain Levels (0-5/10)  Pain Level Score  Description  No Pain 0   Mild pain 1 Nagging, annoying, but does not interfere with basic activities of daily living (ADL). Patients are able to eat, bathe, get dressed, toileting (being able to get on and off the toilet and perform personal hygiene functions), transfer (move in and out of bed or a chair without assistance), and maintain continence (able to control bladder and bowel functions). Blood pressure and heart rate are unaffected. A normal heart rate for a healthy adult ranges from 60 to 100 bpm  (beats per minute).   Mild to moderate pain 2 Noticeable and distracting. Impossible to hide from other people. More frequent flare-ups. Still possible to adapt and function close to normal. It can be very annoying and may have occasional stronger flare-ups. With discipline, patients may get used to it and adapt.   Moderate pain 3 Interferes significantly with activities  of daily living (ADL). It becomes difficult to feed, bathe, get dressed, get on and off the toilet or to perform personal hygiene functions. Difficult to get in and out of bed or a chair without assistance. Very distracting. With effort, it can be ignored when deeply involved in activities.   Moderately severe pain 4 Impossible to ignore for more than a few minutes. With effort, patients may still be able to manage work or participate in some social activities. Very difficult to concentrate. Signs of autonomic nervous system discharge are evident: dilated pupils (mydriasis); mild sweating (diaphoresis); sleep interference. Heart rate becomes elevated (>115 bpm). Diastolic blood pressure (lower number) rises above 100 mmHg. Patients find relief in laying down and not moving.   Severe pain 5 Intense and extremely unpleasant. Associated with frowning face and frequent crying. Pain overwhelms the senses.  Ability to do any activity or maintain social relationships becomes significantly limited. Conversation becomes difficult. Pacing back and forth is common, as getting into a comfortable position is nearly impossible. Pain wakes you up from deep sleep. Physical signs will be obvious: pupillary dilation; increased sweating; goosebumps; brisk reflexes; cold, clammy hands and feet; nausea, vomiting or dry heaves; loss of appetite; significant sleep disturbance with inability to fall asleep or to remain asleep. When persistent, significant weight loss is observed due to the complete loss of appetite and sleep deprivation.  Blood pressure and heart  rate becomes significantly elevated. Caution: If elevated blood pressure triggers a pounding headache associated with blurred vision, then the patient should immediately seek attention at an urgent or emergency care unit, as these may be signs of an impending stroke.    Emergency Department Pain Levels (6-10/10)  Emergency Room Pain 6 Severely limiting. Requires emergency care and should not be seen or managed at an outpatient pain management facility. Communication becomes difficult and requires great effort. Assistance to reach the emergency department may be required. Facial flushing and profuse sweating along with potentially dangerous increases in heart rate and blood pressure will be evident.   Distressing pain 7 Self-care is very difficult. Assistance is required to transport, or use restroom. Assistance to reach the emergency department will be required. Tasks requiring coordination, such as bathing and getting dressed become very difficult.   Disabling pain 8 Self-care is no longer possible. At this level, pain is disabling. The individual is unable to do even the most "basic" activities such as walking, eating, bathing, dressing, transferring to a bed, or toileting. Fine motor skills are lost. It is difficult to think clearly.   Incapacitating pain 9 Pain becomes incapacitating. Thought processing is no longer possible. Difficult to remember your own name. Control of movement and coordination are lost.   The worst pain imaginable 10 At this level, most patients pass out from pain. When this level is reached, collapse of the autonomic nervous system occurs, leading to a sudden drop in blood pressure and heart rate. This in turn results in a temporary and dramatic drop in blood flow to the brain, leading to a loss of consciousness. Fainting is one of the body's self defense mechanisms. Passing out puts the brain in a calmed state and causes it to shut down for a while, in order to begin the  healing process.    Summary: 1. Refer to this scale when providing [place] with your pain level. 2. Be accurate and careful when reporting your pain level. This will help with your care. 3. Over-reporting your pain level will lead to  loss of credibility. 4. Even a level of 1/10 means that there is pain and will be treated at our facility. 5. High, inaccurate reporting will be documented as "Symptom Exaggeration", leading to loss of credibility and suspicions of possible secondary gains such as obtaining more narcotics, or wanting to appear disabled, for fraudulent reasons. 6. Only pain levels of 5 or below will be seen at our facility. 7. Pain levels of 6 and above will be sent to the Emergency Department and the appointment cancelled. ____________________________________________________________________________________________

## 2017-09-07 NOTE — Progress Notes (Signed)
Safety precautions to be maintained throughout the outpatient stay will include: orient to surroundings, keep bed in low position, maintain call bell within reach at all times, provide assistance with transfer out of bed and ambulation.  

## 2017-09-11 NOTE — Progress Notes (Signed)
Results were reviewed and found to be: abnormal  No acute injury or pathology identified  Review would suggest interventional pain management techniques may be of benefit

## 2017-09-11 NOTE — Progress Notes (Signed)
Results were reviewed and found to be: mildly abnormal  No acute injury or pathology identified  Review would suggest interventional pain management techniques may be of benefit 

## 2017-09-13 LAB — COMP. METABOLIC PANEL (12)
ALK PHOS: 65 IU/L (ref 39–117)
AST: 27 IU/L (ref 0–40)
Albumin/Globulin Ratio: 1.7 (ref 1.2–2.2)
Albumin: 4.5 g/dL (ref 3.5–4.8)
BUN/Creatinine Ratio: 15 (ref 10–24)
BUN: 20 mg/dL (ref 8–27)
Bilirubin Total: 0.5 mg/dL (ref 0.0–1.2)
CHLORIDE: 98 mmol/L (ref 96–106)
CREATININE: 1.32 mg/dL — AB (ref 0.76–1.27)
Calcium: 9.8 mg/dL (ref 8.6–10.2)
GFR calc Af Amer: 61 mL/min/{1.73_m2} (ref >59)
GFR calc non Af Amer: 52 mL/min/{1.73_m2} — ABNORMAL LOW (ref >59)
Globulin, Total: 2.7 g/dL (ref 1.5–4.5)
Glucose: 85 mg/dL (ref 65–99)
Potassium: 4 mmol/L (ref 3.5–5.2)
Sodium: 143 mmol/L (ref 134–144)
Total Protein: 7.2 g/dL (ref 6.0–8.5)

## 2017-09-13 LAB — C-REACTIVE PROTEIN: CRP: 3.1 mg/L (ref 0.0–4.9)

## 2017-09-13 LAB — 25-HYDROXYVITAMIN D LCMS D2+D3: 25-HYDROXY, VITAMIN D: 36 ng/mL

## 2017-09-13 LAB — MAGNESIUM: Magnesium: 2.1 mg/dL (ref 1.6–2.3)

## 2017-09-13 LAB — 25-HYDROXY VITAMIN D LCMS D2+D3: 25-Hydroxy, Vitamin D-3: 36 ng/mL

## 2017-09-13 LAB — SEDIMENTATION RATE: Sed Rate: 27 mm/hr (ref 0–30)

## 2017-09-13 LAB — VITAMIN B12: VITAMIN B 12: 786 pg/mL (ref 232–1245)

## 2017-09-14 LAB — COMPLIANCE DRUG ANALYSIS, UR

## 2017-10-23 ENCOUNTER — Other Ambulatory Visit: Payer: Self-pay

## 2017-10-23 ENCOUNTER — Ambulatory Visit: Payer: Medicare Other | Attending: Internal Medicine

## 2017-10-23 DIAGNOSIS — M25562 Pain in left knee: Secondary | ICD-10-CM | POA: Diagnosis present

## 2017-10-23 DIAGNOSIS — G8929 Other chronic pain: Secondary | ICD-10-CM | POA: Diagnosis present

## 2017-10-23 DIAGNOSIS — M25561 Pain in right knee: Secondary | ICD-10-CM | POA: Diagnosis not present

## 2017-10-23 NOTE — Therapy (Signed)
[place] MAIN [facility] SERVICES [address] [place], [place], [address] Phone: [phone]   Fax:  [phone]  Physical Therapy Evaluation  Patient Details  Name: Jay Sandoval MRN: [id number] Date of Birth: [birth year] No data recorded  Encounter Date: 10/23/2017  PT End of Session - 10/23/17 1421    Visit Number  1    Number of Visits  1    Date for PT Re-Evaluation  10/24/17    PT Start Time  1300    PT Stop Time  6962    PT Time Calculation (min)  59 min    Equipment Utilized During Treatment  Other (comment) 3L O2    Activity Tolerance  Patient tolerated treatment well;Treatment limited secondary to medical complications (Comment)    Behavior During Therapy  [facility] for tasks assessed/performed       Past Medical History:  Diagnosis Date  . Arthritis   . Asthma   . Hypertension   . Osteoporosis     Past Surgical History:  Procedure Laterality Date  . KNEE ARTHROPLASTY    . SHOULDER ARTHROSCOPY DISTAL CLAVICLE EXCISION AND OPEN ROTATOR CUFF REPAIR      There were no vitals filed for this visit.   Subjective Assessment - 10/23/17 1315    Subjective  Patient is a pleasant 77 year old male who presents for power w/c eval for Hoverround chair.     Pertinent History  Jay Sandoval is a 77 y.o. year old, male patient, who comes today for hoverround wheelchair eval. Patient has been in power chairs for the past 10 years, currently has a Music therapist that is in [employer]. Has had multiple falls with fractures resulting.  Has an aide that comes in 2 hours a day 7 days a week. He has DDD (degenerative disc disease), lumbar; Obesity; Swelling of limb; Lymphedema; Asthma without status asthmaticus; Back muscle spasm; Chest pain with high risk for cardiac etiology; Chronic lower back pain; Congestive heart failure (HCC); COPD (chronic obstructive pulmonary disease) ([place]); GERD (gastroesophageal reflux disease); Hyperlipidemia, unspecified; Osteoarthritis of  knees, bilateral; Preop cardiovascular exam; Psoriasis; Sleep apnea; SOB (shortness of breath) on exertion; Hypertension; Dependence on continuous positive airway pressure ventilation; Venous stasis; Asthma; Benign hypertension; Hypercholesterolemia; Obstructive sleep apnea syndrome; Chronic pain of both knees (Primary Area of Pain)(R>L); Chronic pain of both shoulders  (Secondary Area of Pain)(R>L); Chronic bilateral low back pain without sciatica [facility] Area of Pain)(R>L); Wrist pain, chronic, left (Fourth Area of Pain); Chronic hand pain, left; Chronic pain syndrome; Long term current use of opiate analgesic; Pharmacologic therapy; Disorder of skeletal system; and Problems influencing health status on their problem list..    Limitations  Standing;Walking;House hold activities;Other (comment)    How long can you sit comfortably?  no problems    How long can you stand comfortably?  needs to hold on, only able to transferring chairs    How long can you walk comfortably?  hasn't walked since 2018    Patient Stated Goals  to have Hoverround for household mobility     Currently in Pain?  Yes    Pain Score  8     Pain Location  Knee    Pain Orientation  Right;Left    Pain Descriptors / Indicators  Aching    Pain Type  Chronic pain    Pain Onset  More than a month ago    Pain Frequency  Constant    Aggravating Factors   weightbearing, moving  Pain Relieving Factors  sitting down    Effect of Pain on Daily Activities  limits mobility     Multiple Pain Sites  Yes    Pain Score  8    Pain Location  Back    Pain Orientation  Posterior;Lower    Pain Descriptors / Indicators  Aching    Pain Type  Chronic pain    Pain Onset  More than a month ago    Pain Frequency  Constant    Aggravating Factors   movement    Pain Relieving Factors  sitting down      PATIENT INFORMATION: This Evaluation form will serve as the LMN for the following suppliers:  Supplier: Contact Person: Phone:   Reason  for Referral: OA referral  Patient/caregiver Goals: To have a hoverround chair to go to the bathroom, bedroom, lift recliner, household mobility and ADL function. Needs it to go shopping for groceries.  Patient was seen for face-to-face evaluation for new power wheelchair.   Further paperwork was completed and sent to vendor.  Patient appears to qualify for power mobility device at this time per objective findings.   MEDICAL HISTORY: Diagnosis: bilateral knee OA Primary Diagnosis Onset:1991 '[]'$ Progressive Disease Relevant Past and Future Surgeries: Height:14f 8 inches  Weight: 398 lb (180.5 kg) Explain and recent changes or trends in weight: Progressive weight gait being tracked by MD.   Relevant History including falls:  Jay Sandoval a 77y.o. year old, male patient, who comes today for hoverround wheelchair eval. 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  Has an aide that comes in 2 hours a day 7 days a week. He has DDD (degenerative disc disease), lumbar; Obesity; Swelling of limb; Lymphedema; Asthma without status asthmaticus; Back muscle spasm; Chest pain with high risk for cardiac etiology; Chronic lower back pain; Congestive heart failure (HCC); COPD (chronic obstructive pulmonary disease) ([place]; GERD (gastroesophageal reflux disease); Hyperlipidemia, unspecified; Osteoarthritis of knees, bilateral; Preop cardiovascular exam; Psoriasis; Sleep apnea; SOB (shortness of breath) on exertion; Hypertension; Dependence on continuous positive airway pressure ventilation; Venous stasis; Asthma; Benign hypertension; Hypercholesterolemia; Obstructive sleep apnea syndrome; Chronic pain of both knees (Primary Area of Pain)(R>L); Chronic pain of both shoulders  (Secondary Area of Pain)(R>L); Chronic bilateral low back pain without sciatica [facility]); Wrist pain, chronic, left (Fourth  Area of Pain); Chronic hand pain, left; Chronic pain syndrome; Long term current use of opiate analgesic; Pharmacologic therapy; Disorder of skeletal system; and Problems influencing health status on their problem list..   HOME ENVIRONMENT: '[]'$ House  '[]'$ Condo/town home  '[x]'$ Apartment  '[]'$ Assisted Living    '[x]'$ Lives Alone '[]'$  Lives with Others                                                    Hours with caregiver: 2 hours per day/ 7 days a week   '[x]'$ Home is accessible to patient            Stairs  '[]'$ Yes '[]'$  No     Ramp '[]'$ Yes '[]'$ No Comments:  House is set up for power WC at this time. Aide assists with ADLs.    COMMUNITY ADL: TRANSPORTATION: '[]'$ Car    '[]'$ Van    '[x]'$ Public Transportation    '[]'$   Adapted w/c Lift   '[]'$ Ambulance   '[x]'$ Other:      Transit bus '[x]'$ Sits in wheelchair during transport  Employment/School:     Specific requirements pertaining to mobility                                                     Other:  Uses power w/c for all community mobility and home mobility.                                      FUNCTIONAL/SENSORY PROCESSING SKILLS:  Handedness:   '[x]'$ Right     '[]'$ Left    '[]'$ NA  Comments:                                 Functional Processing Skills for Wheeled Mobility '[x]'$ Processing Skills are adequate for safe wheelchair operation  Areas of concern than may interfere with safe operation of wheelchair Description of problem   '[]'$  Attention to environment     '[]'$ Judgment     '[]'$  Hearing  '[]'$  Vision or visual processing    '[]'$ Motor Planning  '[]'$  Fluctuations in Behavior                                                   VERBAL COMMUNICATION:WFL '[]'$ WFL receptive '[]'$  WFL expressive '[]'$ Understandable  '[]'$ Difficult to understand  '[]'$ non-communicative '[]'$  Uses an augmented communication device    CURRENT SEATING / MOBILITY: Current Mobility Base:   '[]'$ None  '[]'$ Dependent  '[]'$ Manual  '[]'$ Scooter  '[x]'$ Power   Type of Control:                       Manufacturer:   J[employer]zy                       Size:                         Age:      5+ years                     Current Condition of Mobility Base:                                                                                                                  Needs repair, Power button broken, power arm broken, does not fit patient's width due to body habitus.    Current Wheelchair components:  Power , forward wheel drive  Describe posture in present seating system:     Body habitus spreading past seat base resulting in forward lean to stay on chair.                                                                        SENSATION and SKIN ISSUES: Sensation '[x]'$ Intact '[]'$ Impaired '[]'$ Absent   Level of sensation:                           Pressure Relief: Able to perform effective pressure relief :   '[x]'$ Yes  '[]'$  No Method:     Tricep pushup for relief                                                                         If not, Why?:                                                                          Skin Issues/Skin Integrity Current Skin Issues   '[x]'$ Yes '[]'$ No  '[]'$ Intact '[]'$  Red area '[]'$  Open Area  '[]'$ Scar Tissue '[]'$ At risk from prolonged sitting  Where Does have  : lymphedema bilateral LE                         History of Skin Issues   '[x]'$ Yes '[]'$ No  Where: bilateral LE                                         When  : Lymphedema                                            Hx of skin flap surgeries '[]'$ Yes '[x]'$ No  Where                  When                                                  Limited sitting tolerance '[]'$ Yes '[x]'$ No Hours spent sitting in wheelchair daily:  Most of the day  Complaint of Pain:  Please describe:   Bilateral knee pain 8/10, low back pain 8/10, bilateral shoulder pain 7/10.                                                                                                            Swelling/Edema:                                                                                                                                          Lymphedema bilateral LE.         ADL STATUS (in reference to wheelchair use):  Indep Assist Unable Indep with Equip Not assessed Comments  Dressing                  x                                        Aide helps               Eating     x                                                          Able to perform independently                                                               Toileting               x                                                                Aide helps when she is there  Bathing                x                                                       Aide helps                                                               Grooming/ Hygiene               x                                                Aide helps with hygene                                                               Meal Prep                 x                                              Aide makes meals. Patient can open can himself.                                                            IADLS                  x                                              Aide assists.                                                   Bowel Management: '[]'$ Continent  '[x]'$ Incontinent  '[x]'$ Accidents Comments:      Needs to have laxitives to help promote as well as stool softeners.   Occasionally has accidents. Difficulty cleaning self.                                         Bladder Management: '[x]'$ Continent  '[]'$ Incontinent  '[x]'$ Accidents Comments: Uses urinal next to him.  WHEELCHAIR SKILLS: Manual w/c Propulsion: '[]'$ UE or LE strength and endurance sufficient to participate in ADLs using manual wheelchair Arm :  '[]'$ left  '[]'$ right  '[]'$ Both                                   Foot:   '[]'$ left '[]'$ right  '[]'$ Both  Distance:   Operate Scooter: '[]'$  Strength, hand grip, balance and transfer appropriate for use '[]'$ Living environment is accessible for use of scooter  Operate Power w/c:  '[x]'$  Std. Joystick   '[]'$  Alternative Controls Indep '[x]'$  Assist '[]'$  Dependent/ Unable '[]'$  N/A '[]'$  '[x]'$ Safe          '[]'$  Functional      Distance:  Uses for daily life              Bed confined without wheelchair '[]'$  Yes '[]'$  No   STRENGTH/RANGE OF MOTION:  Range of Motion Strength  Shoulder    Flexion   R 90 pain, L109       Abduction: R 62 L 91 pain                                                                                              R 3+/5 pain L 4/5 pain  Elbow     Limited by body habitus                                                                                                     R 3+/5 pain L 4-/5  pain    Wrist/Hand                                                               Limited by body habitus                                                            Painful 3/5 bilateral               Hip  Limited by body habitus: panus                                                                  3/5 painful   Knee                                                     Limited by pain and body habitus. Limited due to positioning in WC                  3/5 painful                                                        Ankle Limited by swelling                  3/5                                                     MOBILITY/BALANCE:  '[]'$  Patient is totally dependent for mobility                                                                                               Balance Transfers Ambulation  Sitting Balance: Standing Balance: '[]'$  Independent '[]'$  Independent/Modified Independent  '[]'$  WFL     '[]'$  WFL '[x]'$  Supervision '[]'$  Supervision  '[x]'$  Uses UE for balance  '[]'$  Supervision '[]'$  Min  Assist '[]'$  Ambulates with Assist                           '[]'$  Min Assist '[]'$  Min assist '[]'$  Mod Assist '[]'$  Ambulates with Device:  '[]'$  RW   '[]'$  StW   '[]'$  Cane   '[]'$                 '[]'$  Mod Assist '[]'$  Mod assist '[]'$  Max assist   '[]'$  Max Assist '[x]'$  Max assist: require UE support '[]'$  Dependent '[]'$  Indep. Short Distance Only  '[]'$  Unable '[]'$  Unable '[]'$  Lift / Sling Required Distance (in feet)                             '[]'$  Sliding board '[x]'$  Unable to Ambulate: (Explain: has not ambulated in >1 year. Uses power w/c primarily  Cardio Status:  '[]'$ Intact  '[x]'$  Impaired   '[]'$  NA  Respiratory Status:  '[]'$ Intact   '[x]'$ Impaired   '[]'$ NA    3 L 02                                 Orthotics/Prosthetics:       n/a                                                                  Comments (Address manual vs power w/c vs scooter):                                                              Anterior / Posterior Obliquity Rotation-Pelvis  PELVIS    '[x]'$ Neutral  '[]'$  Posterior  '[]'$  Anterior     '[]'$ WFL  '[]'$ Right Elevated  '[]'$ Left Elevated   '[]'$ WFL  '[]'$ Right Anterior '[]'$   Left Anterior    '[]'$  Fixed '[x]'$  Partly Flexible '[]'$  Flexible  '[]'$  Other  '[]'$  Fixed  '[]'$  Partly Flexible  '[]'$  Flexible '[]'$  Other  '[]'$  Fixed  '[]'$  Partly Flexible  '[]'$  Flexible '[]'$  Other  TRUNK '[]'$ WFL '[x]'$ Thoracic Kyphosis '[]'$ Lumbar Lordosis   '[]'$  WFL '[]'$ Convex Right '[]'$ Convex Left   '[]'$ c-curve '[]'$ s-curve '[]'$ multiple  '[]'$  Neutral '[]'$  Left-anterior '[]'$  Right-anterior    '[]'$  Fixed '[]'$  Flexible '[x]'$  Partly Flexible       Other  '[]'$  Fixed '[]'$  Flexible '[]'$  Partly Flexible '[]'$  Other  '[]'$  Fixed           '[]'$  Flexible '[]'$  Partly Flexible '[]'$  Other   Position Windswept   HIPS  '[x]'$  Neutral '[]'$  Abduct '[]'$  ADduct '[]'$  Neutral '[]'$  Right '[]'$  Left       '[]'$  Fixed  '[x]'$  Partly Flexible             '[]'$  Dislocated '[]'$  Flexible '[]'$  Subluxed    '[]'$  Fixed '[]'$  Partly Flexible  '[]'$  Flexible '[]'$  Other              Foot Positioning Knee Positioning    Knees and  Feet  '[x]'$  WFL '[]'$ Left '[]'$ Right '[x]'$  WFL '[]'$ Left '[]'$ Right   KNEES ROM concerns: ROM concerns:   & Dorsi-Flexed                    '[]'$ Lt '[]'$ Rt                                  FEET Plantar Flexed                  '[]'$ Lt '[]'$ Rt     Inversion                    '[]'$ Lt '[]'$ Rt     Eversion                    '[]'$ Lt '[]'$ Rt    HEAD '[x]'$  Functional '[x]'$  Good Head Control   & '[]'$  Flexed         '[]'$  Extended '[]'$   Adequate Head Control   NECK '[]'$  Rotated  Lt  '[]'$  Lat Flexed Lt '[]'$  Rotated  Rt '[]'$  Lat Flexed Rt '[]'$  Limited Head Control    '[]'$  Cervical Hyperextension '[]'$  Absent  Head Control    SHOULDERS ELBOWS WRIST& HAND         Left     Right    Left     Right  U/E '[]'$ Functional  Left            '[]'$ Functional  Right                                 '[]'$ Fisting             '[]'$ Fisting     '[]'$ elevated Left '[]'$ depressed  Left '[]'$ elevated Right '[]'$ depressed  Right      '[x]'$ protracted Left '[]'$ retracted Left '[x]'$ protracted Right '[]'$ retracted Right '[]'$ subluxed  Left              '[]'$ subluxed  Right         Goals for Wheelchair Mobility  '[x]'$  Independence with mobility in the home with motor related ADLs (MRADLs)  '[x]'$  Independence with MRADLs in the community '[]'$  Provide dependent mobility  '[]'$  Provide recline     '[]'$ Provide tilt   Goals for Seating system '[]'$  Optimize pressure distribution '[x]'$  Provide support needed to facilitate function or safety '[]'$  Provide corrective forces to assist with maintaining or improving posture '[]'$  Accommodate client's posture: current seated postures and positions are not flexible or will not tolerate corrective forces '[x]'$  Client to be independent with relieving pressure in the wheelchair '[]'$ Enhance physiological function such as breathing, swallowing, digestion  Simulation ideas/Equipment trials:    HoverRound Power chair [employer] HD6                                                                                             State why other equipment was unsuccessful:    Patient is a pleasant  77 year old male who presents for evaluation of power wheelchair (Hoverround). Patient currently utilizes a [employer] and the power button has been broken for the past five years. Reports the company will not fit it/correct it, does not fit correctly in chair either, is too small for him. Patient's width (knee to knee) 61 cm, depth (end of gluteal to knee): 56.5 cm; height of leg seated from heel to top of knee: 55.5 cm. Patient has been non-ambulatory for >1 year and has been utilizing a power chair for >10 years per patient report. Patient has severe pain in knees, back, and shoulders with pain and body habitus limiting available ROM and strength. Patient is unable to propel a manual w/c due to his weight and limited shoulder ROM/strength. Patient requires assistance with ADLs with home aide and requires use of power chair in house for mobility and ADLs. Patient's body habitus and weight result in need for sturdier chair base and wider/longer chair seat than current set up. Patient would benefit from [facility] HD6 for home mobility, participation in ADLs, and ability to negotiate  natural environment.                                                                            MOBILITY BASE RECOMMENDATIONS and JUSTIFICATION: MOBILITY COMPONENT JUSTIFICATION  Manufacturer: HoverRound          Model:    Technique HD6          Size: Width      44     Seat Depth     29        '[x]'$ provide transport from point A to B '[x]'$ promote Indep mobility  '[x]'$ is not a safe, functional ambulator '[x]'$ walker or cane inadequate '[x]'$ non-standard width/depth necessary to accommodate anatomical measurement '[]'$                             '[]'$ Manual Mobility Base '[]'$ non-functional ambulator    '[]'$ Scooter/POV  '[]'$ can safely operate  '[]'$ can safely transfer   '[]'$ has adequate trunk stability  '[]'$ cannot functionally propel manual w/c  '[x]'$ Power Mobility Base  '[x]'$ non-ambulatory  '[x]'$ cannot functionally propel manual wheelchair   '[]'$  cannot functionally and safely operate scooter/POV '[x]'$ can safely operate and willing to  '[]'$ Stroller Base '[]'$ infant/child  '[]'$ unable to propel manual wheelchair '[]'$ allows for growth '[]'$ non-functional ambulator '[]'$ non-functional UE '[]'$ Indep mobility is not a goal at this time  '[]'$ Tilt  '[]'$ Forward                   '[]'$ Backward                  '[]'$ Powered tilt              '[]'$ Manual tilt  '[]'$ change position against gravitational force on head and shoulders  '[]'$ change position for pressure relief/cannot weight shift '[]'$ transfers  '[]'$ management of tone '[]'$ rest periods '[]'$ control edema '[]'$ facilitate postural control  '[]'$                                       '[]'$ Recline  '[]'$ Power recline on power base '[]'$ Manual recline on manual base  '[]'$ accommodate femur to back angle  '[]'$ bring to full recline for ADL care  '[]'$ change position for pressure relief/cannot weight shift '[]'$ rest periods '[]'$ repositioning for transfers or clothing/diaper /catheter changes '[]'$ head positioning  '[]'$ Lighter weight required '[]'$ self- propulsion  '[]'$ lifting '[]'$                                                 '[x]'$ Heavy Duty required '[x]'$ user weight greater than 250# '[]'$ extreme tone/ over active movement '[]'$ broken frame on previous chair '[]'$                                     '[]'$  Back  '[]'$  Angle Adjustable '[]'$  Custom molded                           '[]'$ postural control '[]'$ control of tone/spasticity '[]'$ accommodation of range  of motion '[]'$ UE functional control '[]'$ accommodation for seating system '[]'$                                          '[]'$ provide lateral trunk support '[]'$ accommodate deformity '[]'$ provide posterior trunk support '[]'$ provide lumbar/sacral support '[]'$ support trunk in midline '[]'$ Pressure relief over spinal processes  '[x]'$  Seat Cushion                       '[]'$ impaired sensation  '[]'$ decubitus ulcers present '[]'$ history of pressure ulceration '[]'$ prevent pelvic extension '[x]'$ low maintenance  '[]'$ stabilize pelvis  '[]'$ accommodate obliquity '[]'$ accommodate  multiple deformity '[]'$ neutralize lower extremity position '[]'$ increase pressure distribution '[]'$                                           '[]'$  Pelvic/thigh support  '[]'$  Lateral thigh guide '[]'$  Distal medial pad  '[]'$  Distal lateral pad '[]'$  pelvis in neutral '[]'$ accommodate pelvis '[]'$  position upper legs '[]'$  alignment '[]'$  accommodate ROM '[]'$  decrease adduction '[]'$ accommodate tone '[]'$ removable for transfers '[]'$ decrease abduction  '[]'$  Lateral trunk Supports '[]'$  Lt     '[]'$  Rt '[]'$ decrease lateral trunk leaning '[]'$ control tone '[]'$ contour for increased contact '[]'$ safety  '[]'$ accommodate asymmetry '[]'$                                                 '[]'$  Mounting hardware  '[]'$ lateral trunk supports  '[]'$ back   '[]'$ seat '[]'$ headrest      '[]'$  thigh support '[]'$ fixed   '[]'$ swing away '[]'$ attach seat platform/cushion to w/c frame '[]'$ attach back cushion to w/c frame '[]'$ mount postural supports '[]'$ mount headrest  '[]'$ swing medial thigh support away '[]'$ swing lateral supports away for transfers  '[]'$                                                     Armrests  '[]'$ fixed '[x]'$ adjustable height '[]'$ removable   '[]'$ swing away  '[]'$ flip back   '[]'$ reclining '[]'$ full length pads '[]'$ desk    '[]'$ pads tubular  '[]'$ provide support with elbow at 90   '[]'$ provide support for w/c tray '[x]'$ change of height/angles for variable activities '[]'$ remove for transfers '[]'$ allow to come closer to table top '[]'$ remove for access to tables '[]'$                                               Hangers/ Leg rests  '[]'$ 60 '[]'$ 70 '[]'$ 90 '[]'$ elevating '[x]'$ heavy duty  '[]'$ articulating '[]'$ fixed '[]'$ lift off '[]'$ swing away     '[]'$ power '[]'$ provide LE support  '[]'$ accommodate to hamstring tightness '[]'$ elevate legs during recline   '[]'$ provide change in position for Legs '[]'$ Maintain placement of feet on footplate '[]'$ durability '[]'$ enable transfers '[]'$ decrease edema '[]'$ Accommodate lower leg length '[]'$   Foot support Footplate    '[]'$ Lt  '[]'$  Rt  '[x]'$  Center mount '[x]'$ flip up                             '[]'$ depth/angle adjustable '[]'$ Amputee adapter    '[]'$  Lt     '[]'$  Rt '[x]'$ provide foot support '[]'$ accommodate to ankle ROM '[]'$ transfers '[]'$ Provide support for residual extremity '[]'$  allow foot to go under wheelchair base '[]'$  decrease tone  '[]'$                                                 '[]'$  Ankle strap/heel loops '[]'$ support foot on foot support '[]'$ decrease extraneous movement '[]'$ provide input to heel  '[]'$ protect foot  Tires: '[x]'$ pneumatic  '[]'$ flat free inserts  '[]'$ solid  '[x]'$ decrease maintenance  '[]'$ prevent frequent flats '[]'$ increase shock absorbency '[]'$ decrease pain from road shock '[]'$ decrease spasms from road shock '[]'$                                              '[]'$  Headrest  '[]'$ provide posterior head support '[]'$ provide posterior neck support '[]'$ provide lateral head support '[]'$ provide anterior head support '[]'$ support during tilt and recline '[]'$ improve feeding   '[]'$ improve respiration '[]'$ placement of switches '[]'$ safety  '[]'$ accommodate ROM  '[]'$ accommodate tone '[]'$ improve visual orientation  '[]'$  Anterior chest strap '[]'$  Vest '[]'$  Shoulder retractors  '[]'$ decrease forward movement of shoulder '[]'$ accommodation of TLSO '[]'$ decrease forward movement of trunk '[]'$ decrease shoulder elevation '[]'$ added abdominal support '[]'$ alignment '[]'$ assistance with shoulder control  '[]'$                                               Pelvic Positioner '[]'$ Belt '[]'$ SubASIS bar '[]'$ Dual Pull '[]'$ stabilize tone '[]'$ decrease falling out of chair/ **will not Decrease potential for sliding due to pelvic tilting '[]'$ prevent excessive rotation '[]'$ pad for protection over boney prominence '[]'$ prominence comfort '[]'$ special pull angle to control rotation '[]'$                                                  Upper ExtremitySupport  '[]'$ L   '[]'$  R '[]'$ Arm trough   '[]'$ hand support '[]'$  tray       '[]'$ full tray '[]'$ swivel mount '[]'$ decrease edema      '[]'$ decrease subluxation   '[]'$ control tone   '[]'$ placement for AAC/Computer/EADL '[]'$ decrease gravitational pull on shoulders '[]'$ provide  midline positioning '[]'$ provide support to increase UE function '[]'$ provide hand support in natural position '[]'$ provide work surface   POWER WHEELCHAIR CONTROLS  '[x]'$ Proportional  '[]'$ Non-Proportional Type                                      '[]'$ Left  '[x]'$ Right '[x]'$ provides access for controlling wheelchair   '[]'$ lacks motor control to operate proportional drive control '[]'$ unable to understand proportional controls  Actuator Control Module  '[]'$ Single  '[]'$ Multiple   '[]'$ Allow the client to operate the power seat function(s) through the joystick  control   '[]'$ Safety Reset Switches '[]'$ Used to change modes and stop the wheelchair when driving in latch mode    '[]'$ Upgraded Electronics   '[]'$ programming for accurate control '[]'$ progressive Disease/changing condition '[]'$ non-proportional drive control needed '[]'$ Needed in order to operate power seat functions through joystick control   '[]'$ Display box '[]'$ Allows user to see in which mode and drive the wheelchair is set  '[]'$ necessary for alternate controls    '[]'$ Digital interface electronics '[]'$ Allows w/c to operate when using alternative drive controls  '[]'$ ASL Head Array '[]'$ Allows client to operate wheelchair  through switches placed in tri-panel headrest  '[]'$ Sip and puff with tubing kit '[]'$ needed to operate sip and puff drive controls  '[]'$ Upgraded tracking electronics '[]'$ increase safety when driving '[]'$ correct tracking when on uneven surfaces  '[]'$ Mount for switches or joystick '[]'$ Attaches switches to w/c  '[]'$ Swing away for access or transfers '[]'$ midline for optimal placement '[]'$ provides for consistent access  '[]'$ Attendant controlled joystick plus mount '[]'$ safety '[]'$ long distance driving '[]'$ operation of seat functions '[]'$ compliance with transportation regulations '[]'$                                             Rear wheel placement/Axle adjustability '[]'$ None '[]'$ semi adjustable '[]'$ fully adjustable  '[]'$ improved UE access to wheels '[]'$ improved stability '[]'$ changing angle in space for improvement of  postural stability '[]'$ 1-arm drive access '[]'$ amputee pad placement '[]'$                                Wheel rims/ hand rims  '[]'$ metal   '[]'$ plastic coated '[]'$ oblique projections           '[]'$ vertical projections '[]'$ Provide ability to propel manual wheelchair  '[]'$  Increase self-propulsion with hand weakness/decreased grasp  Push handles '[]'$ extended   '[]'$ angle adjustable              '[]'$ standard '[]'$ caregiver access '[]'$ caregiver assist '[]'$ allows "hooking" to enable increased ability to perform ADLs or maintain balance  One armed device   '[]'$ Lt   '[]'$ Rt '[]'$ enable propulsion of manual wheelchair with one arm   '[]'$                                            Brake/wheel lock extension '[]'$  Lt   '[]'$  Rt '[]'$ increase indep in applying wheel locks   '[]'$ Side guards '[]'$ prevent clothing getting caught in wheel or becoming soiled '[]'$  prevent skin tears/abrasions  Battery:  2                                          '[x]'$ to power wheelchair                                                         Other:  The above equipment has a life- long use expectancy. Growth and changes in medical and/or functional conditions would be the exceptions. This is to certify that the therapist has no financial relationship with durable medical provider or manufacturer. The therapist will not receive remuneration of any kind for the equipment recommended in this evaluation.   Patient has mobility limitation that significantly impairs safe, timely participation in one or more mobility related ADL's. (bathing, toileting, feeding, dressing, grooming, moving from room to room)  '[x]'$  Yes '[]'$  No  Will mobility device sufficiently improve ability to participate and/or be aided in participation of MRADL's?      '[x]'$  Yes '[]'$  No  Can limitation be compensated for with use of a cane or walker?                                    '[]'$  Yes '[x]'$  No  Does patient  or caregiver demonstrate ability/potential ability & willingness to safely use the mobility device?    '[x]'$  Yes '[]'$  No  Does patient's home environment support use of recommended mobility device?            '[x]'$  Yes '[]'$  No  Does patient have sufficient upper extremity function necessary to functionally propel a manual wheelchair?     '[]'$  Yes '[x]'$  No  Does patient have sufficient strength and trunk stability to safely operate a POV (scooter)?                                  '[]'$  Yes '[x]'$  No  Does patient need additional features/benefits provided by a power wheelchair for MRADL's in the home?        '[x]'$  Yes '[]'$  No  Does the patient demonstrate the ability to safely use a power wheelchair?                   '[x]'$  Yes '[]'$  No     Physician's Name Printed:                                                        Physician's Signature:  Date:     This is to certify that I, the above signed therapist have the following affiliations: '[x]'$  This DME provider '[]'$  Manufacturer of recommended equipment '[]'$  Patient's long term care facility '[]'$  None of the above  Therapist Name/Signature: Janna Arch, PT, DPT                                              Date: 10/23/17       [facility] PT Assessment - 10/23/17 0001      Assessment   Medical Diagnosis  bilateral knee OA     Onset Date/Surgical Date  -- 1991    Hand Dominance  Right    Prior Therapy  yes      Precautions   Precautions  Fall      Restrictions   Weight Bearing Restrictions  No      Balance Screen   Has the patient fallen in the past  6 months  No    Has the patient had a decrease in activity level because of a fear of falling?   Yes    Is the patient reluctant to leave their home because of a fear of falling?   Yes      [place]  Private residence    Living Arrangements  Alone    Available Help at Discharge  Available PRN/intermittently    Type of Reserve - power;Grab bars - toilet;Grab bars - tub/shower;Tub bench    Additional Comments  [employer]      Prior Function   Level of Independence  Needs assistance with ADLs requires assistance for dressing and bathing from aide    Vocation  On disability    Leisure  watch TV      Cognition   Overall Cognitive Status  Within Functional Limits for tasks assessed                Objective measurements completed on examination: See above findings.              PT Education - 10/23/17 1421    Education Details  hoverround chair    Person(s) Educated  Patient    Methods  Explanation    Comprehension  Verbalized understanding          PT Long Term Goals - 10/23/17 1422      PT LONG TERM GOAL #1   Title  Pt and caregivers will understand PT recommendation and appropriate/safe use for wheelchair and seating for home use.    Baseline  patient understands    Time  1    Period  Days    Status  Achieved    Target Date  10/23/17             Plan - 10/23/17 1417    Clinical Impression Statement  Patient is a pleasant 77 year old male who presents for evaluation of power wheelchair (Hoverround). Patient currently utilizes a [employer] and the power button has been broken for the past five years. Reports the company will not fit it/correct it, does not fit correctly in chair either, is too small for him. Patient's width (knee to knee) 61 cm, depth (end of gluteal to knee): 56.5 cm; height of leg seated from heel to top of knee: 55.5 cm. Patient has been non-ambulatory for >1 year and has been utilizing a power chair for >10 years per patient report. Patient has severe pain in knees, back, and shoulders with pain and body habitus limiting available ROM and strength. Patient is unable to propel a manual w/c due to his weight and limited shoulder ROM/strength. Patient requires assistance with ADLs with home aide  and requires use of power chair in house for mobility and ADLs. Patient's body habitus and weight result in need for sturdier chair base and wider/longer chair seat than current set up. Patient would benefit from [facility] HD[facility] for home mobility, participation in ADLs, and ability to negotiate natural environment.      PT Frequency  One time visit    Consulted and Agree with Plan of Care  Patient       Patient will benefit from skilled therapeutic intervention in order to improve the following deficits and impairments:     Visit  Diagnosis: Chronic pain of right knee  Chronic pain of left knee     Problem List Patient Active Problem List   Diagnosis Date Noted  . Asthma without status asthmaticus 09/07/2017  . Chronic lower back pain 09/07/2017  . COPD (chronic obstructive pulmonary disease) ([place]) 09/07/2017  . GERD (gastroesophageal reflux disease) 09/07/2017  . Hyperlipidemia, unspecified 09/07/2017  . Psoriasis 09/07/2017  . Sleep apnea 09/07/2017  . Hypertension 09/07/2017  . Chronic pain of both knees (Primary Area of Pain)(R>L) 09/07/2017  . Chronic pain of both shoulders  (Secondary Area of Pain)(R>L) 09/07/2017  . Chronic bilateral low back pain without sciatica [facility] Area of Pain)(R>L) 09/07/2017  . Wrist pain, chronic, left (Fourth Area of Pain) 09/07/2017  . Chronic hand pain, left 09/07/2017  . Chronic pain syndrome 09/07/2017  . Long term current use of opiate analgesic 09/07/2017  . Pharmacologic therapy 09/07/2017  . Disorder of skeletal system 09/07/2017  . Problems influencing health status 09/07/2017  . Obesity 03/22/2016  . Swelling of limb 03/22/2016  . Lymphedema 03/22/2016  . Preop cardiovascular exam 07/07/2015  . Chest pain with high risk for cardiac etiology 06/23/2015  . SOB (shortness of breath) on exertion 06/23/2015  . Dependence on continuous positive airway pressure ventilation 06/17/2015  . Congestive heart failure ([place])  03/11/2015  . Venous stasis 03/11/2015  . Asthma 03/11/2015  . Benign hypertension 03/11/2015  . Hypercholesterolemia 03/11/2015  . Obstructive sleep apnea syndrome 03/11/2015  . Back muscle spasm 02/26/2015  . DDD (degenerative disc disease), lumbar 10/20/2014  . Osteoarthritis of knees, bilateral 11/19/2013   Janna Arch, PT, DPT   10/23/2017, 2:25 PM  [place] [facility] [address] [place], [place], [address] Phone: [phone]   Fax:  [phone]  Name: Jay Sandoval MRN: [id number] Date of Birth: 03-04-1941

## 2017-12-13 IMAGING — CT CT ABD-PELV W/ CM
2 of 5 series · 16 of 46 positions shown, 18 images · IV contrast (omnipaque)
Comparison: 01/09/2015

CLINICAL DATA: Left flank pain 3 months with dark urine.

EXAM:
CT ABDOMEN AND PELVIS WITH CONTRAST
TECHNIQUE: Multidetector CT imaging of the abdomen and pelvis was performed
using the standard protocol following bolus administration of
intravenous contrast.
CONTRAST:  100mL OMNIPAQUE IOHEXOL 300 MG/ML  SOLN

[Series 2: routine abd pel with · axial · 0.90mm/px · z∈[+635,+1025]mm · 13 of 91 slices shown, 15 images]
[im 7/91  soft-tissue]
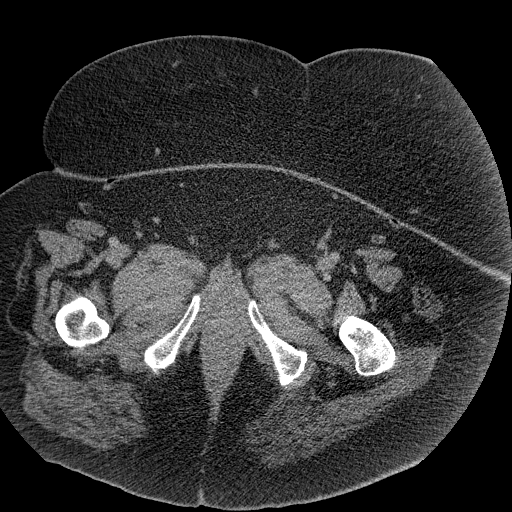
[im 7/91  bone]
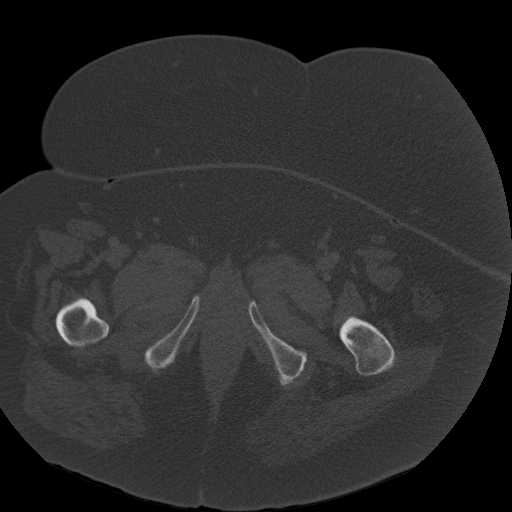
[im 13/91  soft-tissue]
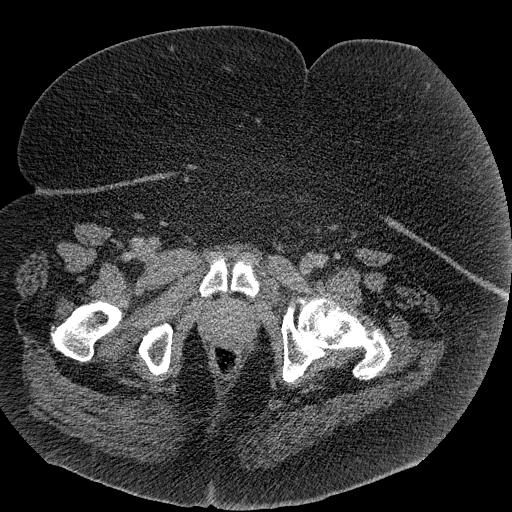
[im 19/91  soft-tissue]
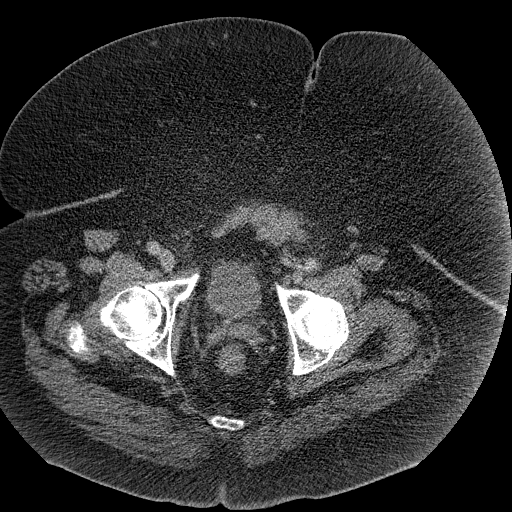
[im 25/91  soft-tissue]
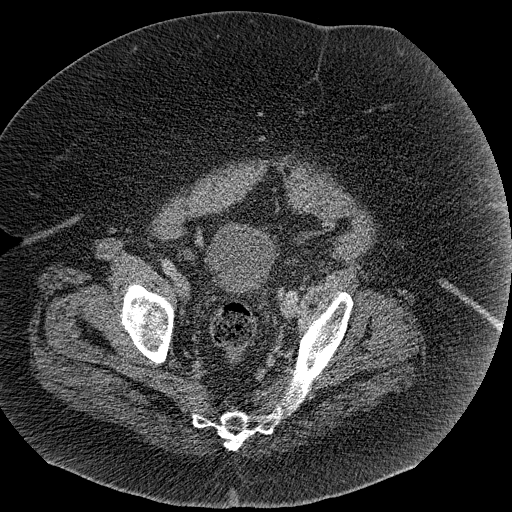
[im 31/91  soft-tissue]
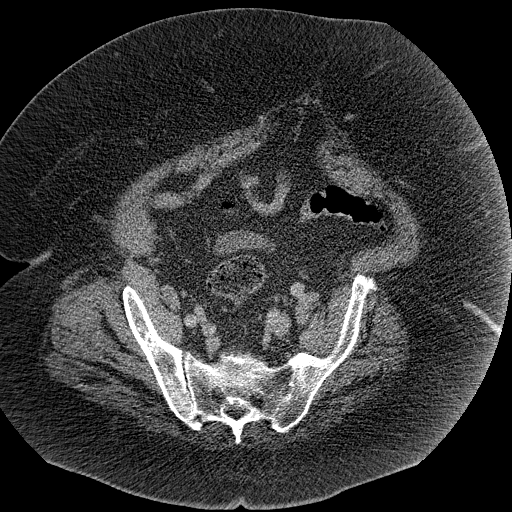
[im 37/91  soft-tissue]
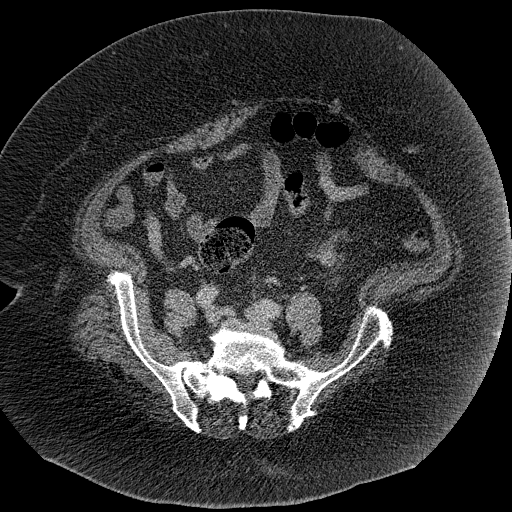
[im 49/91  soft-tissue]
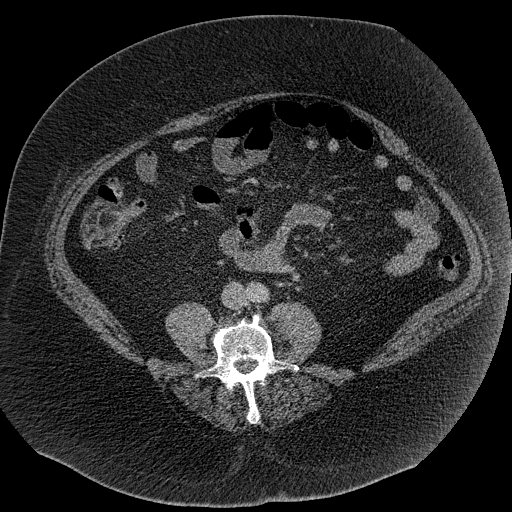
[im 55/91  soft-tissue]
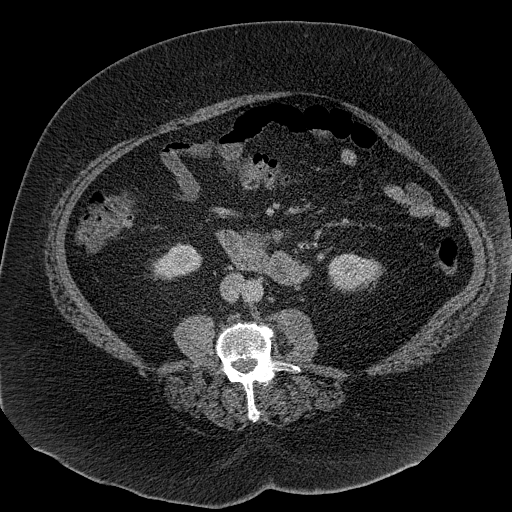
[im 61/91  soft-tissue]
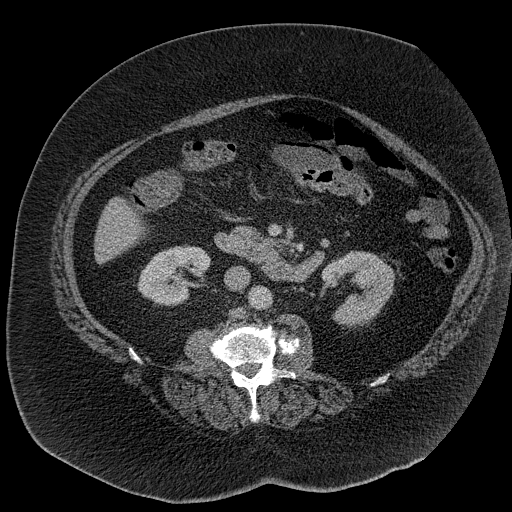
[im 61/91  bone]
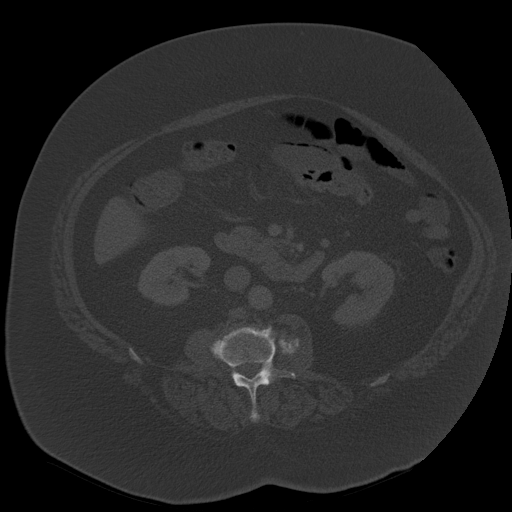
[im 67/91  soft-tissue]
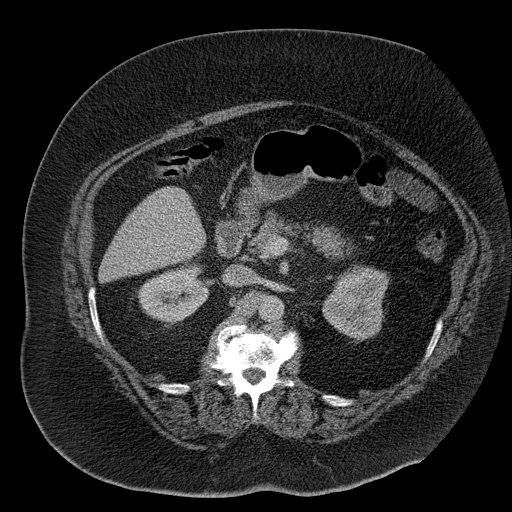
[im 73/91  soft-tissue]
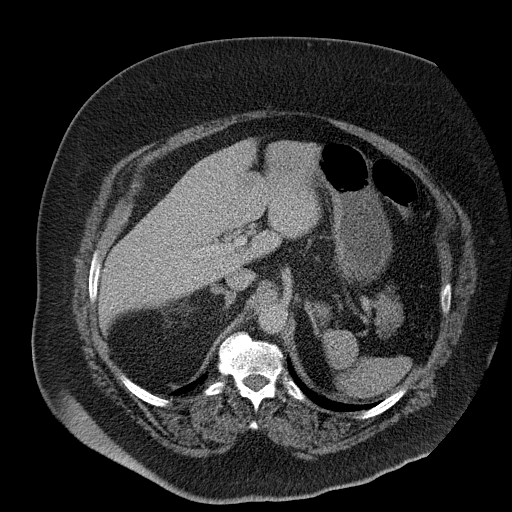
[im 79/91  soft-tissue]
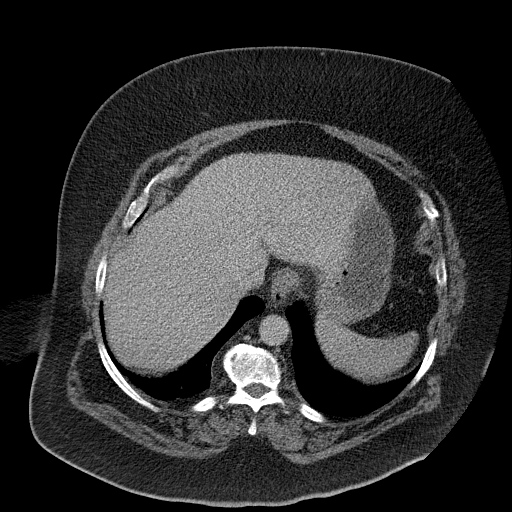
[im 85/91  soft-tissue]
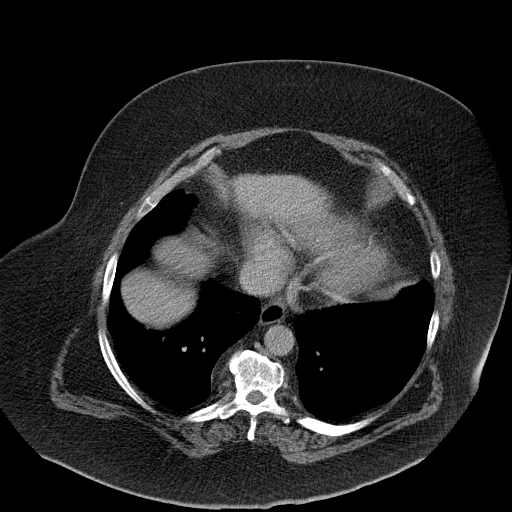

[Series 5: cor routine abd pel with · coronal · 1.02mm/px · 3 of 232 slices shown]
[im 78/232  soft-tissue]
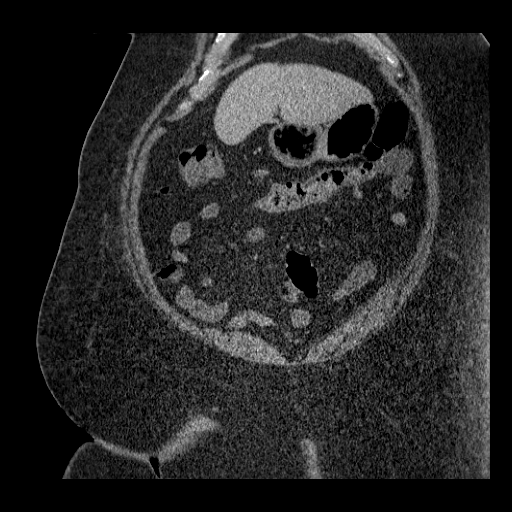
[im 103/232  soft-tissue]
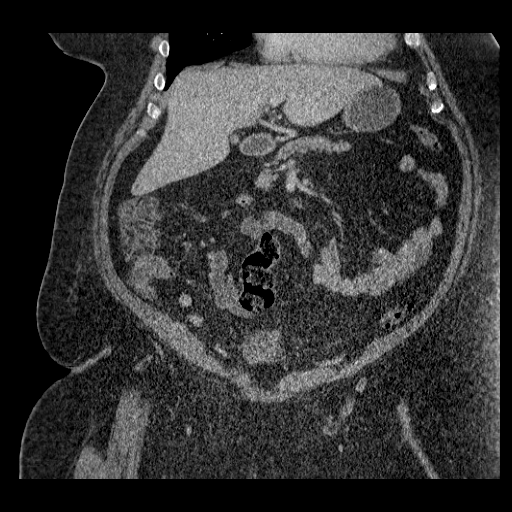
[im 129/232  soft-tissue]
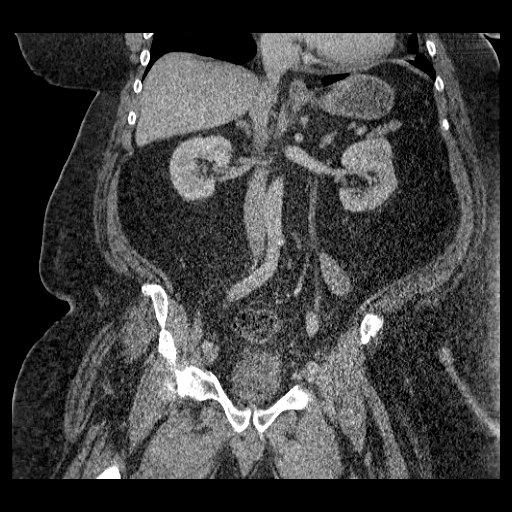

[16 of 46 positions shown; findings below may reference images not displayed]

FINDINGS: Lung bases demonstrate minimal linear atelectasis right base.

Abdominal images demonstrate the gallbladder to be contracted. There
is a normal liver, spleen and stomach. There is a 1 cm slightly
exophytic cystic focus adjacent the body of the pancreas unchanged.

There are a few small bilateral adrenal nodules unchanged with the
largest over the right adrenal gland measuring 2.3 cm indeterminate,
but likely adenomas.

Kidneys are normal size without hydronephrosis or nephrolithiasis.
The ureters are within normal.

There is diverticulosis throughout the colon without active
inflammation. Appendix is normal. Small bowel is within normal.
Mesentery is normal.

Mild diastases of the rectus abdominus muscles with small back
containing umbilical hernia unchanged.

Pelvic images demonstrate the bladder, prostate and rectum to be
within normal.

There are moderate overlying soft tissues. There are moderate
degenerate changes of the spine with disc disease at all levels of
the lumbar spine. Mild degenerate change of the hips.
IMPRESSION: No acute findings in the abdomen/ pelvis.

1 cm cystic focus over the body of the pancreas unchanged. Recommend
followup MRI 1 year. This recommendation follows ACR consensus
guidelines: Managing Incidental Findings on Abdominal CT: White
Paper of the ACR Incidental Findings Committee. [HOSPITAL]
0010;[DATE].

Small bilateral adrenal nodules unchanged indeterminate, but likely
adenomas.

Diverticulosis of the colon without active inflammation.

Small back containing umbilical hernia unchanged.

Moderate degenerate change of the spine with disc disease at all
levels of the lumbar spine.

## 2018-02-23 ENCOUNTER — Other Ambulatory Visit: Payer: Self-pay | Admitting: Internal Medicine

## 2018-02-23 DIAGNOSIS — R109 Unspecified abdominal pain: Secondary | ICD-10-CM

## 2018-02-28 ENCOUNTER — Ambulatory Visit
Admission: RE | Admit: 2018-02-28 | Discharge: 2018-02-28 | Disposition: A | Discharge status: Home / Self Care | Payer: Medicare Other | Source: Ambulatory Visit | Attending: Internal Medicine | Admitting: Internal Medicine

## 2018-02-28 DIAGNOSIS — I77811 Abdominal aortic ectasia: Secondary | ICD-10-CM | POA: Diagnosis not present

## 2018-02-28 DIAGNOSIS — R109 Unspecified abdominal pain: Secondary | ICD-10-CM | POA: Insufficient documentation

## 2018-02-28 DIAGNOSIS — R82998 Other abnormal findings in urine: Secondary | ICD-10-CM | POA: Diagnosis not present

## 2018-03-05 ENCOUNTER — Other Ambulatory Visit: Payer: Self-pay | Admitting: Internal Medicine

## 2018-03-05 DIAGNOSIS — I77811 Abdominal aortic ectasia: Secondary | ICD-10-CM

## 2018-04-13 DIAGNOSIS — J9611 Chronic respiratory failure with hypoxia: Secondary | ICD-10-CM | POA: Insufficient documentation

## 2018-10-18 ENCOUNTER — Other Ambulatory Visit: Payer: Self-pay | Admitting: Internal Medicine

## 2018-10-18 DIAGNOSIS — M25551 Pain in right hip: Secondary | ICD-10-CM

## 2018-10-18 DIAGNOSIS — M5136 Other intervertebral disc degeneration, lumbar region: Secondary | ICD-10-CM

## 2018-11-12 DIAGNOSIS — M2550 Pain in unspecified joint: Secondary | ICD-10-CM | POA: Insufficient documentation

## 2018-11-12 DIAGNOSIS — M159 Polyosteoarthritis, unspecified: Secondary | ICD-10-CM | POA: Insufficient documentation

## 2018-11-14 ENCOUNTER — Ambulatory Visit
Admission: RE | Admit: 2018-11-14 | Discharge: 2018-11-14 | Disposition: A | Discharge status: Home / Self Care | Payer: Medicare Other | Source: Ambulatory Visit | Attending: Internal Medicine | Admitting: Internal Medicine

## 2018-11-14 DIAGNOSIS — M5136 Other intervertebral disc degeneration, lumbar region: Secondary | ICD-10-CM

## 2018-11-14 DIAGNOSIS — M25551 Pain in right hip: Secondary | ICD-10-CM

## 2018-12-05 ENCOUNTER — Emergency Department: Payer: Medicare Other

## 2018-12-05 ENCOUNTER — Emergency Department
Admission: EM | Admit: 2018-12-05 | Discharge: 2018-12-05 | Disposition: A | Discharge status: Home / Self Care | Payer: Medicare Other | Attending: Emergency Medicine | Admitting: Emergency Medicine

## 2018-12-05 ENCOUNTER — Other Ambulatory Visit: Payer: Self-pay

## 2018-12-05 DIAGNOSIS — I509 Heart failure, unspecified: Secondary | ICD-10-CM | POA: Insufficient documentation

## 2018-12-05 DIAGNOSIS — Z87891 Personal history of nicotine dependence: Secondary | ICD-10-CM | POA: Insufficient documentation

## 2018-12-05 DIAGNOSIS — R1032 Left lower quadrant pain: Secondary | ICD-10-CM | POA: Diagnosis not present

## 2018-12-05 DIAGNOSIS — Z79899 Other long term (current) drug therapy: Secondary | ICD-10-CM | POA: Insufficient documentation

## 2018-12-05 DIAGNOSIS — I110 Hypertensive heart disease with heart failure: Secondary | ICD-10-CM | POA: Insufficient documentation

## 2018-12-05 DIAGNOSIS — J45909 Unspecified asthma, uncomplicated: Secondary | ICD-10-CM | POA: Insufficient documentation

## 2018-12-05 LAB — URINALYSIS, COMPLETE (UACMP) WITH MICROSCOPIC
Bacteria, UA: NONE SEEN
Bilirubin Urine: NEGATIVE
Glucose, UA: NEGATIVE mg/dL
Hgb urine dipstick: NEGATIVE
Ketones, ur: NEGATIVE mg/dL
Leukocytes,Ua: NEGATIVE
Nitrite: NEGATIVE
Protein, ur: NEGATIVE mg/dL
Specific Gravity, Urine: 1.031 — ABNORMAL HIGH (ref 1.005–1.030)
pH: 7 (ref 5.0–8.0)

## 2018-12-05 LAB — CBC
HCT: 43.9 % (ref 39.0–52.0)
Hemoglobin: 14.4 g/dL (ref 13.0–17.0)
MCH: 30.8 pg (ref 26.0–34.0)
MCHC: 32.8 g/dL (ref 30.0–36.0)
MCV: 94 fL (ref 80.0–100.0)
Platelets: 166 10*3/uL (ref 150–400)
RBC: 4.67 MIL/uL (ref 4.22–5.81)
RDW: 12.9 % (ref 11.5–15.5)
WBC: 6.2 10*3/uL (ref 4.0–10.5)
nRBC: 0 % (ref 0.0–0.2)

## 2018-12-05 LAB — COMPREHENSIVE METABOLIC PANEL
ALT: 24 U/L (ref 0–44)
AST: 34 U/L (ref 15–41)
Albumin: 4.1 g/dL (ref 3.5–5.0)
Alkaline Phosphatase: 49 U/L (ref 38–126)
Anion gap: 8 (ref 5–15)
BUN: 9 mg/dL (ref 8–23)
CO2: 29 mmol/L (ref 22–32)
Calcium: 9.4 mg/dL (ref 8.9–10.3)
Chloride: 103 mmol/L (ref 98–111)
Creatinine, Ser: 0.85 mg/dL (ref 0.61–1.24)
GFR calc Af Amer: >60 mL/min (ref >60)
GFR calc non Af Amer: >60 mL/min (ref >60)
Glucose, Bld: 113 mg/dL — ABNORMAL HIGH (ref 70–99)
Potassium: 3.5 mmol/L (ref 3.5–5.1)
Sodium: 140 mmol/L (ref 135–145)
Total Bilirubin: 0.6 mg/dL (ref 0.3–1.2)
Total Protein: 7.4 g/dL (ref 6.5–8.1)

## 2018-12-05 LAB — LIPASE, BLOOD: Lipase: 28 U/L (ref 11–51)

## 2018-12-05 MED ORDER — IOHEXOL 300 MG/ML  SOLN
125.0000 mL | Freq: Once | INTRAMUSCULAR | Status: AC | PRN
  Administered 2018-12-05: 14:00:00 125 mL via INTRAVENOUS

## 2018-12-05 MED ORDER — MORPHINE SULFATE (PF) 4 MG/ML IV SOLN
4.0000 mg | Freq: Once | INTRAVENOUS | Status: AC
  Administered 2018-12-05: 15:00:00 4 mg via INTRAVENOUS
  Filled 2018-12-05: qty 1

## 2018-12-05 MED ORDER — IOHEXOL 240 MG/ML SOLN
50.0000 mL | Freq: Once | INTRAMUSCULAR | Status: AC | PRN
  Administered 2018-12-05: 50 mL via ORAL

## 2018-12-05 NOTE — ED Provider Notes (Signed)
[facility] Emergency Department Provider Note   ____________________________________________   First MD Initiated Contact with Patient 12/05/18 1212     (approximate)  I have reviewed the triage vital signs and the nursing notes.   HISTORY  Chief Complaint Abdominal Pain    HPI Jay Sandoval is a 78 y.o. male is a history of hypertension morbid obesity, COPD  Patient presents today, reports that for 2 years now he is been experiencing left lower abdominal pain but over the last several days it just seems to be doing worse he is now experiencing a server ripping feeling, pointing towards the left side of his lower abdomen.  Is been having it for a long time, but reports he just keeps getting worse seemingly day after day.  Feels sharp at times.  He consulted with his physician and saw Dr. couple weeks ago, they were unable to identify the cause for his pain then.  No shortness of breath.  No trouble breathing.  No chest pain.  No exposure to COVID.  No cough or upper respiratory symptoms.  Denies chest pain.   Past Medical History:  Diagnosis Date  . Arthritis   . Asthma   . Hypertension   . Osteoporosis     Patient Active Problem List   Diagnosis Date Noted  . Asthma without status asthmaticus 09/07/2017  . Chronic lower back pain 09/07/2017  . COPD (chronic obstructive pulmonary disease) ([place]) 09/07/2017  . GERD (gastroesophageal reflux disease) 09/07/2017  . Hyperlipidemia, unspecified 09/07/2017  . Psoriasis 09/07/2017  . Sleep apnea 09/07/2017  . Hypertension 09/07/2017  . Chronic pain of both knees (Primary Area of Pain)(R>L) 09/07/2017  . Chronic pain of both shoulders  (Secondary Area of Pain)(R>L) 09/07/2017  . Chronic bilateral low back pain without sciatica [facility] Area of Pain)(R>L) 09/07/2017  . Wrist pain, chronic, left (Fourth Area of Pain) 09/07/2017  . Chronic hand pain, left 09/07/2017  . Chronic pain syndrome 09/07/2017   . Long term current use of opiate analgesic 09/07/2017  . Pharmacologic therapy 09/07/2017  . Disorder of skeletal system 09/07/2017  . Problems influencing health status 09/07/2017  . Obesity 03/22/2016  . Swelling of limb 03/22/2016  . Lymphedema 03/22/2016  . Preop cardiovascular exam 07/07/2015  . Chest pain with high risk for cardiac etiology 06/23/2015  . SOB (shortness of breath) on exertion 06/23/2015  . Dependence on continuous positive airway pressure ventilation 06/17/2015  . Congestive heart failure ([place]) 03/11/2015  . Venous stasis 03/11/2015  . Asthma 03/11/2015  . Benign hypertension 03/11/2015  . Hypercholesterolemia 03/11/2015  . Obstructive sleep apnea syndrome 03/11/2015  . Back muscle spasm 02/26/2015  . DDD (degenerative disc disease), lumbar 10/20/2014  . Osteoarthritis of knees, bilateral 11/19/2013    Past Surgical History:  Procedure Laterality Date  . KNEE ARTHROPLASTY    . SHOULDER ARTHROSCOPY DISTAL CLAVICLE EXCISION AND OPEN ROTATOR CUFF REPAIR      Prior to Admission medications   Medication Sig Start Date End Date Taking? Authorizing Provider  Acetaminophen (ARTHRITIS PAIN PO) Take by mouth.    [provider]  albuterol (PROVENTIL) (2.5 MG/3ML) 0.083% nebulizer solution Inhale into the lungs. 03/04/16   [provider]  allopurinol (ZYLOPRIM) 100 MG tablet Take by mouth. 05/27/14   [provider]  amLODipine-atorvastatin (CADUET) 5-10 MG tablet  02/28/16   [provider]  Ascorbic Acid (VITAMIN C CR) 500 MG CPCR Take by mouth.    [provider]  atenolol (  TENORMIN) 100 MG tablet  02/15/16   [provider]  benazepril (LOTENSIN) 40 MG tablet TAKE ONE TABLET BY MOUTH ONCE DAILY 02/25/16   [provider]  cephALEXin (KEFLEX) 500 MG capsule Take 1 capsule (500 mg total) by mouth 4 (four) times daily. Patient not taking: Reported on 09/07/2017 04/23/15   Gayla Doss, MD  clobetasol  ointment (TEMOVATE) 0.05 % Apply 1 application topically 2 (two) times daily.    [provider]  Coenzyme Q10 100 MG capsule Take by mouth.    [provider]  DOCOSAHEXAENOIC ACID PO Take by mouth. 01/07/16   [provider]  fluticasone (FLONASE) 50 MCG/ACT nasal spray Place into both nostrils daily.    [provider]  Fluticasone-Salmeterol (ADVAIR DISKUS) 250-50 MCG/DOSE AEPB INHALE ONE DOSE BY MOUTH TWICE DAILY. RINSE MOUTH AFTER USE. 11/26/15   [provider]  furosemide (LASIX) 80 MG tablet Take by mouth. 08/12/15   [provider]  hydrALAZINE (APRESOLINE) 50 MG tablet TAKE ONE TABLET BY MOUTH THREE TIMES DAILY 11/16/15   [provider]  hydrocortisone 2.5 % cream Apply topically 2 (two) times daily.    [provider]  metolazone (ZAROXOLYN) 2.5 MG tablet  02/25/16   [provider]  mometasone (NASONEX) 50 MCG/ACT nasal spray Place 2 sprays into the nose daily.    [provider]  Multiple Vitamins-Minerals (MULTIVITAL PO) Take 1 tablet by mouth.    [provider]  nystatin cream (MYCOSTATIN)  02/25/16   [provider]  omega-3 acid ethyl esters (LOVAZA) 1 g capsule Take by mouth 2 (two) times daily.    [provider]  omega-3 fish oil (MAXEPA) 1000 MG CAPS capsule Take by mouth.    [provider]  omeprazole (PRILOSEC) 20 MG capsule Take by mouth.    [provider]  oxyCODONE (ROXICODONE) 5 MG immediate release tablet Take 1 tablet (5 mg total) by mouth every 6 (six) hours as needed for moderate pain. Do not drive while taking this medication. Patient not taking: Reported on 09/07/2017 04/23/15   Gayla Doss, MD  potassium chloride (K-DUR) 10 MEQ tablet TAKE ONE TABLET BY MOUTH ONCE DAILY 09/07/15   [provider]  pregabalin (LYRICA) 50 MG capsule TAKE ONE CAPSULE BY MOUTH TWICE DAILY 11/26/15   [provider]  PROAIR HFA [id number]  Base) MCG/ACT inhaler  03/12/16   [provider]  RESTASIS 0.05 % ophthalmic emulsion  02/25/16   [provider]  senna-docusate (SENOKOT-S) 8.6-50 MG tablet Take by mouth.    [provider]  terazosin (HYTRIN) 2 MG capsule Take by mouth. 02/27/14   [provider]  tiotropium (SPIRIVA) 18 MCG inhalation capsule Place 18 mcg into inhaler and inhale daily.    [provider]  tiZANidine (ZANAFLEX) 4 MG tablet  03/21/16   [provider]  torsemide (DEMADEX) 10 MG tablet Take 10 mg by mouth daily.    [provider]  torsemide (DEMADEX) 20 MG tablet Take 20 mg by mouth daily.    [provider]  traMADol (ULTRAM) 50 MG tablet TAKE ONE TABLET BY MOUTH TWICE DAILY AS NEEDED 03/15/16   [provider]  triamcinolone cream (KENALOG) 0.1 % APPLY  CREAM EXTERNALLY TWICE DAILY 11/26/15   [provider]    Allergies Patient has no known allergies.  History reviewed. No pertinent family history.  Social History Social History   Tobacco Use  . Smoking status:  Former Smoker    Quit date: 05/15/2015    Years since quitting: 3.5  . Smokeless tobacco: Never Used  Substance Use Topics  . Alcohol use: No  . Drug use: Never    Review of Systems Constitutional: No fever/chills Eyes: No visual changes. ENT: No sore throat. Cardiovascular: Denies chest pain. Respiratory: Denies shortness of breath. Gastrointestinal: Left lower abdominal pain, lifts up his pannus on the left and reports this is because of his pain is located in this area. Genitourinary: Negative for dysuria. Musculoskeletal: Negative for back pain. Skin: Negative for rash. Neurological: Negative for headaches, areas of focal weakness or numbness.    ____________________________________________   PHYSICAL EXAM:  VITAL SIGNS: ED Triage Vitals  Enc Vitals Group     BP 12/05/18 1216 (!) 165/88     Pulse Rate 12/05/18 1216 92     Resp  12/05/18 1216 (!) 22     Temp 12/05/18 1216 98 F (36.7 C)     Temp Source 12/05/18 1216 Oral     SpO2 12/05/18 1215 94 %     Weight 12/05/18 1218 (!) 416 lb (188.7 kg)     Height 12/05/18 1218 5\' 7"  (1.702 m)     Head Circumference --      Peak Flow --      Pain Score 12/05/18 1217 9     Pain Loc --      Pain Edu? --      Excl. in GC? --     Constitutional: Alert and oriented. Well appearing and in no acute distress.  He is very pleasant, able to stand from stretcher to bed on his own.  His notably morbidly obese. Eyes: Conjunctivae are normal. Head: Atraumatic. Nose: No congestion/rhinnorhea. Mouth/Throat: Mucous membranes are moist. Neck: No stridor.  Cardiovascular: Normal rate, regular rhythm. Grossly normal heart sounds.  Good peripheral circulation. Respiratory: Normal respiratory effort.  No retractions. Lungs CTAB. Gastrointestinal: Soft and nontender. No distention.  Limited abdominal examination due to obesity.  His pannus was pulled back and examined, he reports tenderness underneath the left lower pannus above the inguinal region.  There is no skin tissue change, there is no rebound or guarding, no erythema or lesions are noted.  Reports no other tenderness to abdominal exam. Musculoskeletal: No lower extremity tenderness but does have mild to moderate bilateral lower extremity edema which she reports is chronic. Neurologic:  Normal speech and language. No gross focal neurologic deficits are appreciated.  Skin:  Skin is warm, dry and intact. No rash noted. Psychiatric: Mood and affect are normal. Speech and behavior are normal.  ____________________________________________   LABS (all labs ordered are listed, but only abnormal results are displayed)  Labs Reviewed  COMPREHENSIVE METABOLIC PANEL - Abnormal; Notable for the following components:      Result Value   Glucose, Bld 113 (*)    All other components within normal limits  URINALYSIS, COMPLETE (UACMP) WITH  MICROSCOPIC - Abnormal; Notable for the following components:   Color, Urine YELLOW (*)    APPearance CLEAR (*)    Specific Gravity, Urine 1.031 (*)    All other components within normal limits  CBC  LIPASE, BLOOD   ____________________________________________  EKG  Reviewed enterotomy at 1220 Couple EKGs were performed to attempt to get a good baseline, unable to give a smooth baseline.  Heart rate 99 QRS 150 QTc 500 Normal sinus rhythm, right bundle branch block.  No evidence of acute ischemia ____________________________________________  RADIOLOGY  Ct  Abdomen Pelvis W Contrast  Result Date: 12/05/2018 CLINICAL DATA:  Left lower quadrant pain EXAM: CT ABDOMEN AND PELVIS WITH CONTRAST TECHNIQUE: Multidetector CT imaging of the abdomen and pelvis was performed using the standard protocol following bolus administration of intravenous contrast. CONTRAST:  OMNIPAQUE IOHEXOL 300 MG/ML  SOLN COMPARISON:  CT 04/23/2015, ultrasound 02/28/2018 FINDINGS: Lower chest: Lung bases demonstrate no acute consolidation or effusion. The heart size is within normal limits. Hepatobiliary: No focal hepatic abnormality. Gallbladder is not well visualized and may be surgically absent. Pancreas: No inflammatory changes. Stable 14 mm cystic lesion off the neck of the pancreas. No inflammatory changes. Spleen: Normal in size without focal abnormality. Adrenals/Urinary Tract: Stable bilateral adrenal gland nodules, measuring up to 2.5 cm on the right and 2.4 cm on the left. Kidneys show no hydronephrosis. The bladder is normal Stomach/Bowel: The stomach is nonenlarged. No dilated small bowel. Negative appendix. Descending and sigmoid colon diverticular disease without acute inflammatory change. No colon wall thickening. Probable tiny diverticulum off the duodenal bulb. Vascular/Lymphatic: Nonaneurysmal aorta. Mild aortic atherosclerosis. No significantly enlarged lymph nodes. Reproductive: Prostate is  unremarkable. Other: Negative for free air or free fluid. Fat in the umbilical region. Musculoskeletal: Degenerative changes of the lumbar spine. No acute or suspicious osseous abnormality. IMPRESSION: 1. No CT evidence for acute intra-abdominal or pelvic abnormality. 2. Descending and sigmoid colon diverticular disease without acute inflammatory change 3. 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 Stable 14 mm cystic lesion off the neck of the pancreas Electronically Signed   By: Jasmine Pang M.D.   On: 12/05/2018 15:04    CT imaging reviewed negative for acute finding. ____________________________________________   PROCEDURES  Procedure(s) performed: None  Procedures  Critical Care performed: No  ____________________________________________   INITIAL IMPRESSION / ASSESSMENT AND PLAN / ED COURSE  Pertinent labs & imaging results that were available during my care of the patient were reviewed by me and considered in my medical decision making (see chart for details).   2 years of left lower quadrant abdominal pain with worsening, describes a ripping or tearing occasionally for in the left lower quadrant.  He is morbidly obese.  CT scan department evaluate the patient, I do believe he would build to undergo CT scan here to further evaluate.  He has of quite reassuring examination, but given his weight it inhibits ability to meticulously examine his abdomen.  We will proceed with lab work and CT to further evaluate for cause of pain.    ----------------------------------------- 3:34 PM on 12/05/2018 -----------------------------------------  Imaging studies negative for acute cause, lab work reassuring.  Patient pain improved after morphine.  Discussed with the patient, he will follow-up with his primary doctor on day.  Return precautions advised. Return precautions and treatment recommendations and follow-up discussed with the patient who  is agreeable with the plan.   ____________________________________________   FINAL CLINICAL IMPRESSION(S) / ED DIAGNOSES  Final diagnoses:  Left lower quadrant abdominal pain        Note:  This document was prepared using Dragon voice recognition software and may include unintentional dictation errors       Sharyn Creamer, MD 12/05/18 1535

## 2018-12-05 NOTE — Discharge Instructions (Addendum)
? ?  Please return to the emergency room right away if you are to develop a fever, severe nausea, your pain becomes severe or worsens, you are unable to keep food down, begin vomiting any dark or bloody fluid, you develop any dark or bloody stools, feel dehydrated, or other new concerns or symptoms arise. ? ?

## 2018-12-05 NOTE — ED Notes (Signed)
Pt to Ct with Tech.

## 2018-12-05 NOTE — ED Triage Notes (Signed)
Pt arrived via [facility] from home. EMS reports that the pt states that he has been having ripping LLQ abdominal pain for the past 2 years that has worsened upon arrival. Pt is on 3L via  chronically and has dyspnea upon exertion. The pt denies any chest pain, N/V/D, difficulty breathing or headache at this time.

## 2018-12-05 NOTE — ED Notes (Signed)
Pt verbalized discharge instructions and has no questions at this time 

## 2018-12-05 NOTE — ED Notes (Signed)
EKG completed at 1215, MD wanted another one preformed. Second EKG preformed at 1218. Signed and interpreted by MD Quale.

## 2018-12-14 ENCOUNTER — Ambulatory Visit: Payer: Medicare Other

## 2018-12-14 ENCOUNTER — Other Ambulatory Visit: Payer: Self-pay

## 2018-12-14 ENCOUNTER — Encounter: Payer: Self-pay | Admitting: Podiatry

## 2018-12-14 ENCOUNTER — Ambulatory Visit (INDEPENDENT_AMBULATORY_CARE_PROVIDER_SITE_OTHER): Payer: Medicare Other | Admitting: Podiatry

## 2018-12-14 VITALS — Temp 98.1°F

## 2018-12-14 DIAGNOSIS — L989 Disorder of the skin and subcutaneous tissue, unspecified: Secondary | ICD-10-CM | POA: Diagnosis not present

## 2018-12-16 NOTE — Progress Notes (Signed)
   Subjective: 78 y.o. wheelchair bound male presenting to the office today as a new patient with a chief complaint of a painful callus lesion noted to the right heel that appeared 6-8 months ago. Wearing shoes increases the pain. He notes the pain radiates up his right lower extremity to the knee. He has been seen for the symptoms at [facility]. Patient is here for further evaluation and treatment.   Past Medical History:  Diagnosis Date  . Arthritis   . Asthma   . Hypertension   . Osteoporosis      Objective:  Physical Exam General: Alert and oriented x3 in no acute distress  Dermatology: Hyperkeratotic lesion(s) present on the right heel. Pain on palpation with a central nucleated core noted. Skin is warm, dry and supple bilateral lower extremities. Negative for open lesions or macerations.  Vascular: Palpable pedal pulses bilaterally. No edema or erythema noted. Capillary refill within normal limits.  Neurological: Epicritic and protective threshold grossly intact bilaterally.   Musculoskeletal Exam: Pain on palpation at the keratotic lesion(s) noted. Range of motion within normal limits bilateral. Muscle strength 5/5 in all groups bilateral.  Assessment: 1. Pre-ulcerative callus lesion noted to the right heel   Plan of Care:  1. Patient evaluated 2. Excisional debridement of keratoic lesion using a chisel blade was performed without incident.  3. Dressed area with light dressing. 4. Recommended OTC lotion daily.  5. Recommended good shoe gear.  6. Patient is to return to the clinic PRN.   Edrick Kins, DPM [facility]  Dr. Edrick Kins, [address]                                        [place], [place] [address]                Office [phone]  Fax [phone]

## 2019-03-04 ENCOUNTER — Ambulatory Visit
Admission: RE | Admit: 2019-03-04 | Discharge: 2019-03-04 | Disposition: A | Discharge status: Home / Self Care | Payer: Medicare Other | Source: Ambulatory Visit | Attending: Internal Medicine | Admitting: Internal Medicine

## 2019-03-04 ENCOUNTER — Other Ambulatory Visit: Payer: Self-pay | Admitting: Internal Medicine

## 2019-03-04 ENCOUNTER — Other Ambulatory Visit: Payer: Self-pay

## 2019-03-04 DIAGNOSIS — R109 Unspecified abdominal pain: Secondary | ICD-10-CM | POA: Insufficient documentation

## 2019-03-04 DIAGNOSIS — G8929 Other chronic pain: Secondary | ICD-10-CM | POA: Insufficient documentation

## 2019-03-04 DIAGNOSIS — I77811 Abdominal aortic ectasia: Secondary | ICD-10-CM | POA: Insufficient documentation

## 2019-03-04 DIAGNOSIS — N281 Cyst of kidney, acquired: Secondary | ICD-10-CM | POA: Diagnosis present

## 2020-01-09 ENCOUNTER — Other Ambulatory Visit: Payer: Self-pay

## 2020-01-09 ENCOUNTER — Emergency Department
Admission: EM | Admit: 2020-01-09 | Discharge: 2020-01-09 | Disposition: A | Discharge status: Home / Self Care | Payer: Medicare Other | Attending: Emergency Medicine | Admitting: Emergency Medicine

## 2020-01-09 DIAGNOSIS — J449 Chronic obstructive pulmonary disease, unspecified: Secondary | ICD-10-CM | POA: Diagnosis not present

## 2020-01-09 DIAGNOSIS — J45909 Unspecified asthma, uncomplicated: Secondary | ICD-10-CM | POA: Diagnosis not present

## 2020-01-09 DIAGNOSIS — I10 Essential (primary) hypertension: Secondary | ICD-10-CM | POA: Diagnosis not present

## 2020-01-09 DIAGNOSIS — Z23 Encounter for immunization: Secondary | ICD-10-CM | POA: Diagnosis not present

## 2020-01-09 DIAGNOSIS — Z79890 Hormone replacement therapy (postmenopausal): Secondary | ICD-10-CM | POA: Diagnosis not present

## 2020-01-09 DIAGNOSIS — W050XXA Fall from non-moving wheelchair, initial encounter: Secondary | ICD-10-CM | POA: Diagnosis not present

## 2020-01-09 DIAGNOSIS — Z79899 Other long term (current) drug therapy: Secondary | ICD-10-CM | POA: Insufficient documentation

## 2020-01-09 DIAGNOSIS — W19XXXA Unspecified fall, initial encounter: Secondary | ICD-10-CM

## 2020-01-09 DIAGNOSIS — S01511A Laceration without foreign body of lip, initial encounter: Secondary | ICD-10-CM | POA: Insufficient documentation

## 2020-01-09 DIAGNOSIS — Z87891 Personal history of nicotine dependence: Secondary | ICD-10-CM | POA: Diagnosis not present

## 2020-01-09 MED ORDER — LIDOCAINE-EPINEPHRINE-TETRACAINE (LET) TOPICAL GEL
3.0000 mL | Freq: Once | TOPICAL | Status: AC
  Administered 2020-01-09: 3 mL via TOPICAL

## 2020-01-09 MED ORDER — TETANUS-DIPHTH-ACELL PERTUSSIS 5-2.5-18.5 LF-MCG/0.5 IM SUSP
0.5000 mL | Freq: Once | INTRAMUSCULAR | Status: AC
  Administered 2020-01-09: 0.5 mL via INTRAMUSCULAR
  Filled 2020-01-09: qty 0.5

## 2020-01-09 MED ORDER — AMOXICILLIN 875 MG PO TABS: 875.0000 mg | ORAL_TABLET | Freq: Two times a day (BID) | ORAL | 0 refills | Status: AC

## 2020-01-09 NOTE — ED Triage Notes (Signed)
Pt comes into the ED via EMS, states he was [place] the rail [place] tracks in his power wheel chair and hit a piece of metal that caused him to thrown forward out of the chair, pt has a lac to the top lip with controlled bleeding. Pt is a/ox4,

## 2020-01-09 NOTE — Discharge Instructions (Signed)
Follow-up with primary care provider in 5 to 6 days for suture removal.  Return to the ER for any increasing pain swelling warmth or redness.

## 2020-01-09 NOTE — ED Provider Notes (Signed)
[facility] EMERGENCY DEPARTMENT Provider Note   CSN: [id number] Arrival date & time: 01/09/20  1300     History Chief Complaint  Patient presents with  . Fall    Jay Sandoval is a 79 y.o. male presents to the emergency department for evaluation of fall.  Patient was in his motorized wheelchair going [place] the railroad tracks when his wheelchair was caught on a piece of metal and patient was thrown forward.  He hit his top lip and suffered a laceration.  Patient thinks he could have bitten his lip.  Denies any head injury, LOC, headache, nausea or vomiting or neck pain.  No numbness tingling radicular symptoms.  No hip groin or thigh pain.  No knee discomfort.  Only complains of laceration to the upper lip.  His tetanus is not up-to-date, he states is been more than 10 years.  HPI     Past Medical History:  Diagnosis Date  . Arthritis   . Asthma   . Hypertension   . Osteoporosis     Patient Active Problem List   Diagnosis Date Noted  . Polyarthralgia 11/12/2018  . Primary osteoarthritis involving multiple joints 11/12/2018  . Chronic respiratory failure with hypoxia (HCC) 04/13/2018  . Asthma without status asthmaticus 09/07/2017  . Chronic lower back pain 09/07/2017  . COPD (chronic obstructive pulmonary disease) (HCC) 09/07/2017  . GERD (gastroesophageal reflux disease) 09/07/2017  . Hyperlipidemia, unspecified 09/07/2017  . Psoriasis 09/07/2017  . Sleep apnea 09/07/2017  . Hypertension 09/07/2017  . Chronic pain of both knees (Primary Area of Pain)(R>L) 09/07/2017  . Chronic pain of both shoulders  (Secondary Area of Pain)(R>L) 09/07/2017  . Chronic bilateral low back pain without sciatica [facility] Area of Pain)(R>L) 09/07/2017  . Wrist pain, chronic, left (Fourth Area of Pain) 09/07/2017  . Chronic hand pain, left 09/07/2017  . Chronic pain syndrome 09/07/2017  . Long term current use of opiate analgesic 09/07/2017  . Pharmacologic therapy  09/07/2017  . Disorder of skeletal system 09/07/2017  . Problems influencing health status 09/07/2017  . Obesity 03/22/2016  . Swelling of limb 03/22/2016  . Lymphedema 03/22/2016  . Preop cardiovascular exam 07/07/2015  . Chest pain with high risk for cardiac etiology 06/23/2015  . SOB (shortness of breath) on exertion 06/23/2015  . Dependence on continuous positive airway pressure ventilation 06/17/2015  . Congestive heart failure (HCC) 03/11/2015  . Venous stasis 03/11/2015  . Asthma 03/11/2015  . Benign hypertension 03/11/2015  . Hypercholesterolemia 03/11/2015  . Obstructive sleep apnea syndrome 03/11/2015  . Back muscle spasm 02/26/2015  . DDD (degenerative disc disease), lumbar 10/20/2014  . Osteoarthritis of knees, bilateral 11/19/2013    Past Surgical History:  Procedure Laterality Date  . KNEE ARTHROPLASTY    . SHOULDER ARTHROSCOPY DISTAL CLAVICLE EXCISION AND OPEN ROTATOR CUFF REPAIR         No family history on file.  Social History   Tobacco Use  . Smoking status: Former Smoker    Quit date: 05/15/2015    Years since quitting: 4.6  . Smokeless tobacco: Never Used  Substance Use Topics  . Alcohol use: No  . Drug use: Never    Home Medications Prior to Admission medications   Medication Sig Start Date End Date Taking? Authorizing Provider  Acetaminophen (ARTHRITIS PAIN PO) Take by mouth.    [provider]  albuterol (PROVENTIL) (2.5 MG/3ML) 0.083% nebulizer solution Inhale into the lungs. 03/04/16   [provider]  allopurinol (ZYLOPRIM) 100  MG tablet Take by mouth. 05/27/14   [provider]  amLODipine-atorvastatin (CADUET) 5-10 MG tablet  02/28/16   [provider]  Ascorbic Acid (VITAMIN C CR) 500 MG CPCR Take by mouth.    [provider]  atenolol (TENORMIN) 100 MG tablet  02/15/16   [provider]  benazepril (LOTENSIN) 40 MG tablet TAKE ONE TABLET BY MOUTH ONCE DAILY 02/25/16   [provider]  calcipotriene (DOVONOX) 0.005 % cream  11/02/18   [provider]  ciprofloxacin (CIPRO) 500 MG tablet ciprofloxacin 500 mg tablet    [provider]  clobetasol ointment (TEMOVATE) 0.05 % Apply 1 application topically 2 (two) times daily.    [provider]  clotrimazole-betamethasone (LOTRISONE) cream APPLY TO AFFECTED AREA EAR TWICE DAILY FOR 7 TO 10 DAYS THEN ONCE OR TWICE WEEKLY AS NEEDED FOR IRRITATION 04/19/18   [provider]  Coenzyme Q10 100 MG capsule Take by mouth.    [provider]  diclofenac sodium (VOLTAREN) 1 % GEL Apply topically. 11/01/17   [provider]  dicyclomine (BENTYL) 10 MG capsule Take by mouth. 02/22/18 02/22/19  [provider]  DOCOSAHEXAENOIC ACID PO Take by mouth. 01/07/16   [provider]  fluticasone (FLONASE) 50 MCG/ACT nasal spray Place into both nostrils daily.    [provider]  Fluticasone-Salmeterol (ADVAIR DISKUS) 250-50 MCG/DOSE AEPB INHALE ONE DOSE BY MOUTH TWICE DAILY. RINSE MOUTH AFTER USE. 11/26/15   [provider]  furosemide (LASIX) 80 MG tablet Take by mouth. 08/12/15   [provider]  hydrALAZINE (APRESOLINE) 50 MG tablet TAKE ONE TABLET BY MOUTH THREE TIMES DAILY 11/16/15   [provider]  hydrocortisone 2.5 % cream Apply topically 2 (two) times daily.    [provider]  levothyroxine (SYNTHROID) 25 MCG tablet TAKE 1 TABLET BY MOUTH ONCE DAILY ON AN EMPTY STOMACH WITH A GLASS OF WATER AT LEAST 30 60 MINUTES BEFORE BREAKFAST 10/13/18   [provider]  mometasone (NASONEX) 50 MCG/ACT nasal spray Place 2 sprays into the nose daily.    [provider]  Multiple Vitamins-Minerals (MULTIVITAL PO) Take 1 tablet by mouth.    [provider]  nystatin cream (MYCOSTATIN)  02/25/16   [provider]  omega-3 fish oil (MAXEPA) 1000 MG CAPS capsule Take by mouth.    [provider]    omeprazole (PRILOSEC) 20 MG capsule Take by mouth.    [provider]  pneumococcal 13-valent conjugate vaccine (PREVNAR 13) SUSP injection Prevnar 13 (PF) 0.5 mL intramuscular syringe    [provider]  pneumococcal 23 valent vaccine (PNEUMOVAX 23) 25 MCG/0.5ML injection Pneumovax-23 25 mcg/0.5 mL injection syringe    [provider]  potassium chloride SA (K-DUR) 20 MEQ tablet Take 20 mEq by mouth daily. 09/06/18   [provider]  pregabalin (LYRICA) 50 MG capsule TAKE ONE CAPSULE BY MOUTH TWICE DAILY 11/26/15   [provider]  pregabalin (LYRICA) 75 MG capsule Take 75 mg by mouth 2 (two) times daily. 12/06/18   [provider]  PROAIR HFA 108 [phone] Base) MCG/ACT inhaler  03/12/16   [provider]  RESTASIS 0.05 % ophthalmic emulsion  02/25/16   [provider]  senna-docusate (SENOKOT-S) 8.6-50 MG tablet Take by mouth.    [provider]  tiotropium (SPIRIVA) 18 MCG inhalation capsule Place 18 mcg into inhaler and inhale daily.    [provider]  tiZANidine (ZANAFLEX) 4 MG tablet  03/21/16  [provider]  torsemide (DEMADEX) 10 MG tablet Take 10 mg by mouth daily.    [provider]  torsemide (DEMADEX) 20 MG tablet Take 20 mg by mouth daily.    [provider]  traMADol (ULTRAM) 50 MG tablet TAKE ONE TABLET BY MOUTH TWICE DAILY AS NEEDED 03/15/16   [provider]  triamcinolone cream (KENALOG) 0.1 % APPLY  CREAM EXTERNALLY TWICE DAILY 11/26/15   [provider]    Allergies    Patient has no known allergies.  Review of Systems   Review of Systems  Constitutional: Negative for fever.  Gastrointestinal: Negative for nausea and vomiting.  Musculoskeletal: Negative for arthralgias, back pain, gait problem, joint swelling, myalgias, neck pain and neck stiffness.  Skin: Positive for wound. Negative for color change and rash.  Neurological: Negative for  dizziness, seizures, weakness, light-headedness and headaches.    Physical Exam Updated Vital Signs BP 128/70 (BP Location: Right Arm)   Pulse 63   Temp 99.3 F (37.4 C) (Oral)   Resp 20   Ht  (1.702 m)   Wt (!) 178.7 kg   SpO2 95%   BMI 61.71 kg/m   Physical Exam Constitutional:      General: He is not in acute distress.    Appearance: He is well-developed. He is obese. He is not ill-appearing or toxic-appearing.  HENT:     Head: Normocephalic.     Comments: Stellate laceration to the mid upper lip, bleeding well controlled.  No significant soft tissue swelling.  Laceration does not cross the vermilion border.    Right Ear: Ear canal and external ear normal.     Left Ear: Ear canal normal.     Nose: Nose normal.  Eyes:     Extraocular Movements: Extraocular movements intact.     Conjunctiva/sclera: Conjunctivae normal.     Pupils: Pupils are equal, round, and reactive to light.  Cardiovascular:     Rate and Rhythm: Normal rate.     Pulses: Normal pulses.  Pulmonary:     Effort: Pulmonary effort is normal. No respiratory distress.     Breath sounds: No wheezing or rales.  Abdominal:     General: There is no distension.     Tenderness: There is no abdominal tenderness.  Musculoskeletal:        General: Normal range of motion.     Cervical back: Normal range of motion.     Comments: Nontender along the shoulders normal active shoulder range of motion.  He has no tenderness on the hips or knees with normal hip and knee range of motion.  He is able straight leg raise bilaterally.  He has normal cervical range of motion.  Skin:    General: Skin is warm.     Findings: No rash.  Neurological:     Mental Status: He is alert and oriented to person, place, and time.  Psychiatric:        Behavior: Behavior normal.        Thought Content: Thought content normal.     ED Results / Procedures / Treatments   Labs (all labs ordered are listed, but only abnormal results are  displayed) Labs Reviewed - No data to display  EKG None  Radiology No results found.  Procedures .Marland KitchenLaceration Repair  Date/Time: 01/09/2020 3:51 PM Performed by: Evon Slack, PA-C Authorized by: Evon Slack, PA-C   Consent:    Consent obtained:  Verbal   Consent given by:  Patient   Alternatives discussed:  No treatment Anesthesia (see MAR for exact dosages):    Anesthesia method:  Topical application   Topical anesthetic:  LET Laceration details:    Location:  Lip   Lip location:  Upper exterior lip   Length (cm):  1.5 (Stellate)   Depth (mm):  3 Repair type:    Repair type:  Intermediate Exploration:    Hemostasis achieved with:  LET Treatment:    Area cleansed with:  Betadine and saline   Amount of cleaning:  Standard Skin repair:    Repair method:  Sutures   Suture size:  6-0   Suture material:  Nylon   Suture technique:  Simple interrupted   Number of sutures:  5 Approximation:    Approximation:  Close   Vermilion border: well-aligned   Post-procedure details:    Dressing:  Open (no dressing)   (including critical care time)  Medications Ordered in ED Medications  lidocaine-EPINEPHrine-tetracaine (LET) topical gel (3 mLs Topical Given by Other 01/09/20 1544)    ED Course  I have reviewed the triage vital signs and the nursing notes.  Pertinent labs & imaging results that were available during my care of the patient were reviewed by me and considered in my medical decision making (see chart for details).    MDM Rules/Calculators/A&P                          79 year old male with laceration to the upper lip.  No other injury to his body.  No headache neck pain LOC, nausea or vomiting.  Tetanus updated in the ED.  Laceration thoroughly cleansed and repaired with sutures.  Patient will follow-up in 5 to 6 days for suture removal.  Understands signs symptoms return to the ER for.  He is placed on prophylactic amoxicillin Final Clinical  Impression(s) / ED Diagnoses Final diagnoses:  Fall, initial encounter  Laceration of upper lip with complication, initial encounter    Rx / DC Orders ED Discharge Orders    None       Ronnette Juniper 01/09/20 1628    Minna Antis, MD 01/10/20 2015

## 2020-01-09 NOTE — ED Notes (Signed)
See triage note  Presents s/p fall   States he was thrown from power chair  Small  Laceration noted

## 2020-01-09 NOTE — ED Triage Notes (Signed)
Arrives ems from home. Fall from automated wheelchair. Upper lip lac, not bleeding at this time. Vitals wnl for ems.

## 2020-03-03 DIAGNOSIS — M1A0710 Idiopathic chronic gout, right ankle and foot, without tophus (tophi): Secondary | ICD-10-CM | POA: Insufficient documentation

## 2020-07-09 ENCOUNTER — Emergency Department: Payer: Medicare Other

## 2020-07-09 ENCOUNTER — Observation Stay
Admission: EM | Admit: 2020-07-09 | Discharge: 2020-07-10 | Disposition: A | Discharge status: Home / Self Care | Payer: Medicare Other | Attending: Family Medicine | Admitting: Family Medicine

## 2020-07-09 ENCOUNTER — Other Ambulatory Visit: Payer: Self-pay

## 2020-07-09 DIAGNOSIS — E039 Hypothyroidism, unspecified: Secondary | ICD-10-CM | POA: Diagnosis not present

## 2020-07-09 DIAGNOSIS — J45909 Unspecified asthma, uncomplicated: Secondary | ICD-10-CM | POA: Insufficient documentation

## 2020-07-09 DIAGNOSIS — Z79899 Other long term (current) drug therapy: Secondary | ICD-10-CM | POA: Insufficient documentation

## 2020-07-09 DIAGNOSIS — K922 Gastrointestinal hemorrhage, unspecified: Secondary | ICD-10-CM | POA: Diagnosis present

## 2020-07-09 DIAGNOSIS — Z20822 Contact with and (suspected) exposure to covid-19: Secondary | ICD-10-CM | POA: Insufficient documentation

## 2020-07-09 DIAGNOSIS — Z87891 Personal history of nicotine dependence: Secondary | ICD-10-CM | POA: Diagnosis not present

## 2020-07-09 DIAGNOSIS — I509 Heart failure, unspecified: Secondary | ICD-10-CM | POA: Insufficient documentation

## 2020-07-09 DIAGNOSIS — K921 Melena: Secondary | ICD-10-CM

## 2020-07-09 DIAGNOSIS — Z791 Long term (current) use of non-steroidal anti-inflammatories (NSAID): Secondary | ICD-10-CM

## 2020-07-09 DIAGNOSIS — Z9981 Dependence on supplemental oxygen: Secondary | ICD-10-CM | POA: Diagnosis not present

## 2020-07-09 DIAGNOSIS — J449 Chronic obstructive pulmonary disease, unspecified: Secondary | ICD-10-CM | POA: Insufficient documentation

## 2020-07-09 DIAGNOSIS — Z7982 Long term (current) use of aspirin: Secondary | ICD-10-CM | POA: Insufficient documentation

## 2020-07-09 DIAGNOSIS — I110 Hypertensive heart disease with heart failure: Secondary | ICD-10-CM | POA: Diagnosis not present

## 2020-07-09 DIAGNOSIS — K5791 Diverticulosis of intestine, part unspecified, without perforation or abscess with bleeding: Secondary | ICD-10-CM | POA: Diagnosis not present

## 2020-07-09 DIAGNOSIS — K635 Polyp of colon: Secondary | ICD-10-CM | POA: Diagnosis not present

## 2020-07-09 DIAGNOSIS — K625 Hemorrhage of anus and rectum: Secondary | ICD-10-CM | POA: Diagnosis present

## 2020-07-09 LAB — CBC
HCT: 42.5 % (ref 39.0–52.0)
HCT: 42.1 % (ref 39.0–52.0)
Hemoglobin: 13.7 g/dL (ref 13.0–17.0)
Hemoglobin: 13.7 g/dL (ref 13.0–17.0)
MCH: 30.6 pg (ref 26.0–34.0)
MCH: 30.7 pg (ref 26.0–34.0)
MCHC: 32.2 g/dL (ref 30.0–36.0)
MCHC: 32.5 g/dL (ref 30.0–36.0)
MCV: 95.1 fL (ref 80.0–100.0)
MCV: 94.4 fL (ref 80.0–100.0)
Platelets: 123 10*3/uL — ABNORMAL LOW (ref 150–400)
Platelets: 125 10*3/uL — ABNORMAL LOW (ref 150–400)
RBC: 4.47 MIL/uL (ref 4.22–5.81)
RBC: 4.46 MIL/uL (ref 4.22–5.81)
RDW: 13.6 % (ref 11.5–15.5)
RDW: 13.7 % (ref 11.5–15.5)
WBC: 6.4 10*3/uL (ref 4.0–10.5)
WBC: 6.3 10*3/uL (ref 4.0–10.5)
nRBC: 0 % (ref 0.0–0.2)
nRBC: 0 % (ref 0.0–0.2)

## 2020-07-09 LAB — COMPREHENSIVE METABOLIC PANEL
ALT: 33 U/L (ref 0–44)
AST: 36 U/L (ref 15–41)
Albumin: 3.8 g/dL (ref 3.5–5.0)
Alkaline Phosphatase: 55 U/L (ref 38–126)
Anion gap: 8 (ref 5–15)
BUN: 17 mg/dL (ref 8–23)
CO2: 32 mmol/L (ref 22–32)
Calcium: 9.4 mg/dL (ref 8.9–10.3)
Chloride: 103 mmol/L (ref 98–111)
Creatinine, Ser: 1.11 mg/dL (ref 0.61–1.24)
GFR, Estimated: >60 mL/min (ref >60)
Glucose, Bld: 98 mg/dL (ref 70–99)
Potassium: 3.6 mmol/L (ref 3.5–5.1)
Sodium: 143 mmol/L (ref 135–145)
Total Bilirubin: 0.8 mg/dL (ref 0.3–1.2)
Total Protein: 7.3 g/dL (ref 6.5–8.1)

## 2020-07-09 LAB — TYPE AND SCREEN
ABO/RH(D): O POS
Antibody Screen: NEGATIVE

## 2020-07-09 LAB — RESP PANEL BY RT-PCR (FLU A&B, COVID) ARPGX2
Influenza A by PCR: NEGATIVE
Influenza B by PCR: NEGATIVE
SARS Coronavirus 2 by RT PCR: NEGATIVE

## 2020-07-09 LAB — PROTIME-INR
INR: 1.1 (ref 0.8–1.2)
Prothrombin Time: 13.4 seconds (ref 11.4–15.2)

## 2020-07-09 LAB — OCCULT BLOOD X 1 CARD TO LAB, STOOL: Fecal Occult Bld: POSITIVE — AB

## 2020-07-09 MED ORDER — ATENOLOL 50 MG PO TABS
50.0000 mg | ORAL_TABLET | Freq: Every day | ORAL | Status: DC
  Administered 2020-07-09 – 2020-07-10 (×2): 50 mg via ORAL
  Filled 2020-07-09 (×2): qty 1

## 2020-07-09 MED ORDER — BENAZEPRIL HCL 20 MG PO TABS
20.0000 mg | ORAL_TABLET | Freq: Every day | ORAL | Status: DC
  Administered 2020-07-09 – 2020-07-10 (×2): 20 mg via ORAL
  Filled 2020-07-09 (×2): qty 1

## 2020-07-09 MED ORDER — HYDRALAZINE HCL 20 MG/ML IJ SOLN: 5.0000 mg | Freq: Four times a day (QID) | INTRAMUSCULAR | Status: DC | PRN

## 2020-07-09 MED ORDER — ALLOPURINOL 100 MG PO TABS
200.0000 mg | ORAL_TABLET | Freq: Every day | ORAL | Status: DC
  Administered 2020-07-09 – 2020-07-10 (×2): 200 mg via ORAL
  Filled 2020-07-09 (×2): qty 2

## 2020-07-09 MED ORDER — ALBUTEROL SULFATE HFA 108 (90 BASE) MCG/ACT IN AERS
2.0000 | INHALATION_SPRAY | Freq: Four times a day (QID) | RESPIRATORY_TRACT | Status: DC | PRN
  Filled 2020-07-09: qty 6.7

## 2020-07-09 MED ORDER — AMLODIPINE BESYLATE 5 MG PO TABS
5.0000 mg | ORAL_TABLET | Freq: Every day | ORAL | Status: DC
  Administered 2020-07-09 – 2020-07-10 (×2): 5 mg via ORAL
  Filled 2020-07-09 (×2): qty 1

## 2020-07-09 MED ORDER — PANTOPRAZOLE SODIUM 40 MG IV SOLR
40.0000 mg | Freq: Two times a day (BID) | INTRAVENOUS | Status: DC
  Administered 2020-07-09 – 2020-07-10 (×2): 40 mg via INTRAVENOUS
  Filled 2020-07-09: qty 40

## 2020-07-09 MED ORDER — LEVOTHYROXINE SODIUM 50 MCG PO TABS
25.0000 ug | ORAL_TABLET | Freq: Every day | ORAL | Status: DC
  Administered 2020-07-10: 25 ug via ORAL
  Filled 2020-07-09: qty 1

## 2020-07-09 MED ORDER — AMLODIPINE-ATORVASTATIN 5-10 MG PO TABS: 1.0000 | ORAL_TABLET | Freq: Every day | ORAL | Status: DC

## 2020-07-09 MED ORDER — TIOTROPIUM BROMIDE MONOHYDRATE 18 MCG IN CAPS
18.0000 ug | ORAL_CAPSULE | Freq: Every day | RESPIRATORY_TRACT | Status: DC
  Administered 2020-07-10: 18 ug via RESPIRATORY_TRACT
  Filled 2020-07-09: qty 5

## 2020-07-09 MED ORDER — ATORVASTATIN CALCIUM 10 MG PO TABS
10.0000 mg | ORAL_TABLET | Freq: Every day | ORAL | Status: DC
  Administered 2020-07-09 – 2020-07-10 (×2): 10 mg via ORAL
  Filled 2020-07-09 (×2): qty 1

## 2020-07-09 MED ORDER — PREGABALIN 75 MG PO CAPS
75.0000 mg | ORAL_CAPSULE | Freq: Two times a day (BID) | ORAL | Status: DC
  Administered 2020-07-09 – 2020-07-10 (×2): 75 mg via ORAL
  Filled 2020-07-09 (×2): qty 1

## 2020-07-09 MED ORDER — IOHEXOL 350 MG/ML SOLN
125.0000 mL | Freq: Once | INTRAVENOUS | Status: AC | PRN
  Administered 2020-07-09: 125 mL via INTRAVENOUS

## 2020-07-09 MED ORDER — PEG 3350-KCL-NA BICARB-NACL 420 G PO SOLR
4000.0000 mL | Freq: Once | ORAL | Status: AC
  Administered 2020-07-09: 4000 mL via ORAL
  Filled 2020-07-09: qty 4000

## 2020-07-09 MED ORDER — DULOXETINE HCL 30 MG PO CPEP
60.0000 mg | ORAL_CAPSULE | Freq: Every day | ORAL | Status: DC
  Administered 2020-07-09 – 2020-07-10 (×2): 60 mg via ORAL
  Filled 2020-07-09 (×2): qty 2

## 2020-07-09 NOTE — ED Triage Notes (Signed)
Rectal bleed , started today

## 2020-07-09 NOTE — ED Notes (Addendum)
Pt urinated and had a large bloody bowel movement, ed rn cleaned patient and room

## 2020-07-09 NOTE — ED Provider Notes (Signed)
[facility] Emergency Department Provider Note    Event Date/Time   First MD Initiated Contact with Patient 07/09/20 1124     (approximate)  I have reviewed the triage vital signs and the nursing notes.   HISTORY  Chief Complaint Rectal Bleeding (Started today )    HPI Jay Sandoval is a 80 y.o. male below listed past medical history presents to the ER for 1 day of bright red blood per rectum filling of the commode.  Has not had any specific pain.  Has felt a little bit of malaise and weakness.  No recent diarrheal illness no recent antibiotics.  Denies any chest pain or shortness of breath.  Does wear 3 L chronically nasal cannula at home.    Past Medical History:  Diagnosis Date  . Arthritis   . Asthma   . Hypertension   . Osteoporosis    History reviewed. No pertinent family history. Past Surgical History:  Procedure Laterality Date  . KNEE ARTHROPLASTY    . SHOULDER ARTHROSCOPY DISTAL CLAVICLE EXCISION AND OPEN ROTATOR CUFF REPAIR     Patient Active Problem List   Diagnosis Date Noted  . Polyarthralgia 11/12/2018  . Primary osteoarthritis involving multiple joints 11/12/2018  . Chronic respiratory failure with hypoxia (HCC) 04/13/2018  . Asthma without status asthmaticus 09/07/2017  . Chronic lower back pain 09/07/2017  . COPD (chronic obstructive pulmonary disease) (HCC) 09/07/2017  . GERD (gastroesophageal reflux disease) 09/07/2017  . Hyperlipidemia, unspecified 09/07/2017  . Psoriasis 09/07/2017  . Sleep apnea 09/07/2017  . Hypertension 09/07/2017  . Chronic pain of both knees (Primary Area of Pain)(R>L) 09/07/2017  . Chronic pain of both shoulders  (Secondary Area of Pain)(R>L) 09/07/2017  . Chronic bilateral low back pain without sciatica [facility] Area of Pain)(R>L) 09/07/2017  . Wrist pain, chronic, left (Fourth Area of Pain) 09/07/2017  . Chronic hand pain, left 09/07/2017  . Chronic pain syndrome 09/07/2017  . Long term  current use of opiate analgesic 09/07/2017  . Pharmacologic therapy 09/07/2017  . Disorder of skeletal system 09/07/2017  . Problems influencing health status 09/07/2017  . Obesity 03/22/2016  . Swelling of limb 03/22/2016  . Lymphedema 03/22/2016  . Preop cardiovascular exam 07/07/2015  . Chest pain with high risk for cardiac etiology 06/23/2015  . SOB (shortness of breath) on exertion 06/23/2015  . Dependence on continuous positive airway pressure ventilation 06/17/2015  . Congestive heart failure (HCC) 03/11/2015  . Venous stasis 03/11/2015  . Asthma 03/11/2015  . Benign hypertension 03/11/2015  . Hypercholesterolemia 03/11/2015  . Obstructive sleep apnea syndrome 03/11/2015  . Back muscle spasm 02/26/2015  . DDD (degenerative disc disease), lumbar 10/20/2014  . Osteoarthritis of knees, bilateral 11/19/2013      Prior to Admission medications   Medication Sig Start Date End Date Taking? Authorizing Provider  albuterol (PROVENTIL) (2.5 MG/3ML) 0.083% nebulizer solution Take 2.5 mg by nebulization every 6 (six) hours as needed. 03/04/16  Yes [provider]  albuterol (VENTOLIN HFA) 108 (90 Base) MCG/ACT inhaler Inhale 2 puffs into the lungs every 6 (six) hours as needed for wheezing or shortness of breath.   Yes [provider]  allopurinol (ZYLOPRIM) 100 MG tablet Take 200 mg by mouth daily. 05/27/14  Yes [provider]  amLODipine-atorvastatin (CADUET) 5-10 MG tablet Take 1 tablet by mouth daily. 02/28/16  Yes [provider]  Ascorbic Acid (VITAMIN C CR) 500 MG CPCR Take 500 mg by mouth daily.   Yes [provider]  aspirin EC 81 MG tablet Take 81 mg by mouth daily. Swallow whole.   Yes [provider]  atenolol (TENORMIN) 50 MG tablet Take 50 mg by mouth daily. 02/15/16  Yes [provider]  benazepril (LOTENSIN) 20 MG tablet Take 20 mg by mouth daily. 02/25/16  Yes [provider]  clotrimazole-betamethasone  (LOTRISONE) cream Apply 1 application topically 2 (two) times daily. 04/19/18  Yes [provider]  Coenzyme Q10 100 MG capsule Take 100 mg by mouth 2 (two) times daily.   Yes [provider]  DULoxetine (CYMBALTA) 60 MG capsule Take 60 mg by mouth daily.   Yes [provider]  fluticasone (FLONASE) 50 MCG/ACT nasal spray Place 2 sprays into both nostrils daily as needed.   Yes [provider]  levothyroxine (SYNTHROID) 25 MCG tablet TAKE 1 TABLET BY MOUTH ONCE DAILY ON AN EMPTY STOMACH WITH A GLASS OF WATER AT LEAST 30 60 MINUTES BEFORE BREAKFAST 10/13/18  Yes [provider]  meloxicam (MOBIC) 15 MG tablet Take 15 mg by mouth daily as needed for pain.   Yes [provider]  metolazone (ZAROXOLYN) 2.5 MG tablet Take 2.5 mg by mouth every 7 (seven) days.   Yes [provider]  Multiple Vitamins-Minerals (MULTIVITAL PO) Take 1 tablet by mouth daily.   Yes [provider]  nystatin cream (MYCOSTATIN) Apply 1 application topically daily as needed. 02/25/16  Yes [provider]  omega-3 fish oil (MAXEPA) 1000 MG CAPS capsule Take 2 capsules by mouth 2 (two) times daily.   Yes [provider]  omeprazole (PRILOSEC) 20 MG capsule Take 20 mg by mouth daily.   Yes [provider]  potassium chloride SA (K-DUR) 20 MEQ tablet Take 20 mEq by mouth daily. 09/06/18  Yes [provider]  pregabalin (LYRICA) 50 MG capsule TAKE ONE CAPSULE BY MOUTH TWICE DAILY 11/26/15  Yes [provider]  pyridOXINE (VITAMIN B-6) 100 MG tablet Take 100 mg by mouth daily.   Yes [provider]  RESTASIS 0.05 % ophthalmic emulsion Place 1 drop into both eyes 2 (two) times daily. 02/25/16  Yes [provider]  senna-docusate (SENOKOT-S) 8.6-50 MG tablet Take 2 tablets by mouth at bedtime.   Yes [provider]  tiotropium (SPIRIVA) 18 MCG inhalation capsule Place 18 mcg into inhaler and inhale  daily.   Yes [provider]  tiZANidine (ZANAFLEX) 4 MG tablet Take 4 mg by mouth 2 (two) times daily as needed. 03/21/16  Yes [provider]  furosemide (LASIX) 80 MG tablet Take by mouth. 08/12/15   [provider]  hydrALAZINE (APRESOLINE) 50 MG tablet TAKE ONE TABLET BY MOUTH THREE TIMES DAILY 11/16/15   [provider]  hydrocortisone 2.5 % cream Apply topically 2 (two) times daily.    [provider]  mometasone (NASONEX) 50 MCG/ACT nasal spray Place 2 sprays into the nose daily.    [provider]  pneumococcal 13-valent conjugate vaccine (PREVNAR 13) SUSP injection Prevnar 13 (PF) 0.5 mL intramuscular syringe    [provider]  pneumococcal 23 valent vaccine (PNEUMOVAX 23) 25 MCG/0.5ML injection Pneumovax-23 25 mcg/0.5 mL injection syringe    [provider]  pregabalin (LYRICA) 75 MG capsule Take 75 mg by mouth 2 (two) times daily. 12/06/18   [provider]  PROAIR HFA 108 [phone] Base) MCG/ACT inhaler  03/12/16   [provider]  torsemide (DEMADEX) 10 MG tablet Take 10 mg by mouth daily. Patient  not taking: Reported on 07/09/2020    [provider]  torsemide (DEMADEX) 20 MG tablet Take 20 mg by mouth daily. Patient not taking: Reported on 07/09/2020    [provider]  traMADol (ULTRAM) 50 MG tablet TAKE ONE TABLET BY MOUTH TWICE DAILY AS NEEDED 03/15/16   [provider]  triamcinolone cream (KENALOG) 0.1 % APPLY  CREAM EXTERNALLY TWICE DAILY 11/26/15   [provider]    Allergies Patient has no known allergies.    Social History Social History   Tobacco Use  . Smoking status: Former Smoker    Quit date: 05/15/2015    Years since quitting: 5.1  . Smokeless tobacco: Never Used  Substance Use Topics  . Alcohol use: No  . Drug use: Never    Review of Systems Patient denies headaches, rhinorrhea, blurry vision, numbness, shortness of breath, chest pain,  edema, cough, abdominal pain, nausea, vomiting, diarrhea, dysuria, fevers, rashes or hallucinations unless otherwise stated above in HPI. ____________________________________________   PHYSICAL EXAM:  VITAL SIGNS: Vitals:   07/09/20 1231 07/09/20 1425  BP: (!) 151/72 (!) 155/79  Pulse: 98 64  Resp: (!) 27 18  Temp:    SpO2: 92% 100%    Constitutional: Alert and oriented.  Eyes: Conjunctivae are normal.  Head: Atraumatic. Nose: No congestion/rhinnorhea. Mouth/Throat: Mucous membranes are moist.   Neck: No stridor. Painless ROM.  Cardiovascular: Normal rate, regular rhythm. Grossly normal heart sounds.  Good peripheral circulation. Respiratory: Normal respiratory effort.  No retractions. Lungs CTAB. Gastrointestinal: Soft and nontender. No distention. No abdominal bruits. No CVA tenderness. Genitourinary: Bright red blood per rectum no rectal mass no hemorrhoid Musculoskeletal: No lower extremity tenderness nor edema.  No joint effusions. Neurologic:  Normal speech and language. No gross focal neurologic deficits are appreciated. No facial droop Skin:  Skin is warm, dry and intact. No rash noted. Psychiatric: Mood and affect are normal. Speech and behavior are normal.  ____________________________________________   LABS (all labs ordered are listed, but only abnormal results are displayed)  Results for orders placed or performed during the hospital encounter of 07/09/20 (from the past 24 hour(s))  CBC     Status: Abnormal   Collection Time: 07/09/20 11:51 AM  Result Value Ref Range   WBC 6.4 4.0 - 10.5 K/uL   RBC 4.47 4.22 - 5.81 MIL/uL   Hemoglobin 13.7 13.0 - 17.0 g/dL   HCT 60.4 54.0 - 98.1 %   MCV 95.1 80.0 - 100.0 fL   MCH 30.6 26.0 - 34.0 pg   MCHC 32.2 30.0 - 36.0 g/dL   RDW 19.1 47.8 - 29.5 %   Platelets 123 (L) 150 - 400 K/uL   nRBC 0.0 0.0 - 0.2 %  Type and screen [facility]     Status: None   Collection Time: 07/09/20 11:51 AM  Result  Value Ref Range   ABO/RH(D) O POS    Antibody Screen NEG    Sample Expiration      07/12/2020,2359 Performed at [facility], [address] [place], [place] [address]   Comprehensive metabolic panel     Status: None   Collection Time: 07/09/20 11:51 AM  Result Value Ref Range   Sodium 143 135 - 145 mmol/L   Potassium 3.6 3.5 - 5.1 mmol/L   Chloride 103 98 - 111 mmol/L   CO2 32 22 - 32 mmol/L   Glucose, Bld 98 70 - 99 mg/dL   BUN 17 8 - 23  mg/dL   Creatinine, Ser 3.38 0.61 - 1.24 mg/dL   Calcium 9.4 8.9 - 25.0 mg/dL   Total Protein 7.3 6.5 - 8.1 g/dL   Albumin 3.8 3.5 - 5.0 g/dL   AST 36 15 - 41 U/L   ALT 33 0 - 44 U/L   Alkaline Phosphatase 55 38 - 126 U/L   Total Bilirubin 0.8 0.3 - 1.2 mg/dL   GFR, Estimated >53 >97 mL/min   Anion gap 8 5 - 15   ____________________________________________ ____________________________________________  RADIOLOGY  I personally reviewed all radiographic images ordered to evaluate for the above acute complaints and reviewed radiology reports and findings.  These findings were personally discussed with the patient.  Please see medical record for radiology report.  ____________________________________________   PROCEDURES  Procedure(s) performed:  Procedures    Critical Care performed: no ____________________________________________   INITIAL IMPRESSION / ASSESSMENT AND PLAN / ED COURSE  Pertinent labs & imaging results that were available during my care of the patient were reviewed by me and considered in my medical decision making (see chart for details).   DDX: Diverticulosis, mass, upper GI bleed, colitis  Jay Sandoval is a 80 y.o. who presents to the ED with bright red blood per rectum as described above.  Patient morbidly obese but hemodynamically stable.  Abdominal exam without any focal tenderness.  Blood work sent for the but differential.  Clinical Course as of 07/09/20 1504  Thu Jul 09, 2020  1240 Patient  with bright red blood per rectum on DRE.  Given as needed history will order CT to evaluate for possible diverticular bleed and source of hemorrhage [PR]  1504 CT imaging with diverticulosis as well as inconclusive colonic thickening concerning for underlying mass.  Given his age comorbidities and bright red blood per rectum will discuss with hospitalist for admission. [PR]    Clinical Course User Index [PR] Willy Eddy, MD    The patient was evaluated in Emergency Department today for the symptoms described in the history of present illness. He/she was evaluated in the context of the global COVID-19 pandemic, which necessitated consideration that the patient might be at risk for infection with the SARS-CoV-2 virus that causes COVID-19. Institutional protocols and algorithms that pertain to the evaluation of patients at risk for COVID-19 are in a state of rapid change based on information released by regulatory bodies including the CDC and federal and state organizations. These policies and algorithms were followed during the patient's care in the ED.  As part of my medical decision making, I reviewed the following data within the electronic MEDICAL RECORD NUMBER Nursing notes reviewed and incorporated, Labs reviewed, notes from prior ED visits and [place] Controlled Substance Database   ____________________________________________   FINAL CLINICAL IMPRESSION(S) / ED DIAGNOSES  Final diagnoses:  Rectal bleeding      NEW MEDICATIONS STARTED DURING THIS VISIT:  New Prescriptions   No medications on file     Note:  This document was prepared using Dragon voice recognition software and may include unintentional dictation errors.    Willy Eddy, MD 07/09/20 1505

## 2020-07-09 NOTE — H&P (Signed)
History and Physical  Jay Sandoval:[id number] DOB: 01-13-1941 DOA: 07/09/2020  Referring physician: Dr. Roxan Hockey, EDP PCP: Barbette Reichmann, MD  Outpatient Specialists: Rheumatology, podiatry studies Patient coming from: Home.  Chief Complaint: Bloody stools.  HPI: Jay Sandoval is a 80 y.o. male with medical history significant for severe morbid obesity, osteoarthritis of multiple joints, gout, essential hypertension, chronic anxiety/depression, OSA on CPAP, self-reported chronic hypoxemia on 3 L continuously, who presented to [facility] ED due to sudden onset single episode of bloody stool this morning after waking up around 9 AM.  Reports watery bright red blood per rectum this AM when having a bowel movement.  Reports left lower quadrant abdominal pain.  No prior history of GI bleed.  States he had 2 prior colonoscopies, last colonoscopy was in 2013 with no significant findings.  He takes a baby aspirin 81 mg daily.  Denies use of Goody powder or other NSAIDs for his osteoarthritis.  Was in his usual state of health prior to this.  Denies any recurrent bloody stool while in the ED.  EDP requested admission due to hematochezia.  ED Course:  Afebrile, BP 150/74, pulse 65, respiration rate 17, O2 saturation 98% on room air.  Lab studies remarkable for hemoglobin 13.7k with baseline of 14.4K in 2020, no leukocytosis, no elevated BUN.  Review of Systems: Review of systems as noted in the HPI. All other systems reviewed and are negative.   Past Medical History:  Diagnosis Date  . Arthritis   . Asthma   . Hypertension   . Osteoporosis    Past Surgical History:  Procedure Laterality Date  . KNEE ARTHROPLASTY    . SHOULDER ARTHROSCOPY DISTAL CLAVICLE EXCISION AND OPEN ROTATOR CUFF REPAIR      Social History:  reports that he quit smoking about 5 years ago. He has never used smokeless tobacco. He reports that he does not drink alcohol and does not use drugs.   No Known Allergies  Family  history: Mother with history of hypertension.  Prior to Admission medications   Medication Sig Start Date End Date Taking? Authorizing Provider  albuterol (PROVENTIL) (2.5 MG/3ML) 0.083% nebulizer solution Take 2.5 mg by nebulization every 6 (six) hours as needed. 03/04/16  Yes [provider]  albuterol (VENTOLIN HFA) 108 (90 Base) MCG/ACT inhaler Inhale 2 puffs into the lungs every 6 (six) hours as needed for wheezing or shortness of breath.   Yes [provider]  allopurinol (ZYLOPRIM) 100 MG tablet Take 200 mg by mouth daily. 05/27/14  Yes [provider]  amLODipine-atorvastatin (CADUET) 5-10 MG tablet Take 1 tablet by mouth daily. 02/28/16  Yes [provider]  ascorbic acid (VITAMIN C) 500 MG tablet Take 500 mg by mouth daily.   Yes [provider]  aspirin EC 81 MG tablet Take 81 mg by mouth daily. Swallow whole.   Yes [provider]  atenolol (TENORMIN) 50 MG tablet Take 50 mg by mouth daily. 02/15/16  Yes [provider]  benazepril (LOTENSIN) 20 MG tablet Take 20 mg by mouth daily. 02/25/16  Yes [provider]  clotrimazole-betamethasone (LOTRISONE) cream Apply 1 application topically 2 (two) times daily. 04/19/18  Yes [provider]  Coenzyme Q10 100 MG capsule Take 100 mg by mouth 2 (two) times daily.   Yes [provider]  DULoxetine (CYMBALTA) 60 MG capsule Take 60 mg by mouth daily.   Yes [provider]  fluticasone (FLONASE) 50 MCG/ACT nasal spray Place 2 sprays into  both nostrils daily as needed.   Yes [provider]  levothyroxine (SYNTHROID) 25 MCG tablet TAKE 1 TABLET BY MOUTH ONCE DAILY ON AN EMPTY STOMACH WITH A GLASS OF WATER AT LEAST 30 60 MINUTES BEFORE BREAKFAST 10/13/18  Yes [provider]  meloxicam (MOBIC) 15 MG tablet Take 15 mg by mouth daily as needed for pain.   Yes [provider]  Multiple Vitamins-Minerals (MULTIVITAL PO) Take 1 tablet  by mouth daily.   Yes [provider]  nystatin cream (MYCOSTATIN) Apply 1 application topically daily as needed. 02/25/16  Yes [provider]  omega-3 fish oil (MAXEPA) 1000 MG CAPS capsule Take 2 capsules by mouth 2 (two) times daily.   Yes [provider]  omeprazole (PRILOSEC) 20 MG capsule Take 20 mg by mouth daily.   Yes [provider]  potassium chloride SA (K-DUR) 20 MEQ tablet Take 20 mEq by mouth daily. 09/06/18  Yes [provider]  pregabalin (LYRICA) 75 MG capsule Take 75 mg by mouth 2 (two) times daily. 12/06/18  Yes [provider]  pyridOXINE (VITAMIN B-6) 100 MG tablet Take 100 mg by mouth daily.   Yes [provider]  RESTASIS 0.05 % ophthalmic emulsion Place 1 drop into both eyes 2 (two) times daily. 02/25/16  Yes [provider]  senna-docusate (SENOKOT-S) 8.6-50 MG tablet Take 2 tablets by mouth at bedtime.   Yes [provider]  tiotropium (SPIRIVA) 18 MCG inhalation capsule Place 18 mcg into inhaler and inhale daily.   Yes [provider]  tiZANidine (ZANAFLEX) 4 MG tablet Take 4 mg by mouth 2 (two) times daily as needed. 03/21/16  Yes [provider]  metolazone (ZAROXOLYN) 2.5 MG tablet Take 2.5 mg by mouth every 7 (seven) days.    [provider]  torsemide (DEMADEX) 20 MG tablet Take 20 mg by mouth daily.    [provider]    Physical Exam: BP (!) 150/74 (BP Location: Right Arm)   Pulse 65   Temp 98.5 F (36.9 C) (Oral)   Resp 17   Ht 5\' 9"  (1.753 m)   Wt (!) 185 kg   SpO2 98%   BMI 60.23 kg/m   . General: 80 y.o. year-old male severely morbidly obese in no acute distress.  Alert and oriented x3. . Cardiovascular: Regular rate and rhythm with no rubs or gallops.  No thyromegaly or JVD noted.  Bilateral lower extremity edema.  Marland Kitchen Respiratory: Clear to auscultation with no wheezes or rales. Good inspiratory effort. . Abdomen: Soft left lower  quadrant tenderness with palpation.  Severely obese. Nondistended with normal bowel sounds x4 quadrants. . Muskuloskeletal: No cyanosis or clubbing noted bilaterally . Neuro: CN II-XII intact, strength, sensation, reflexes . Skin: No ulcerative lesions noted or rashes . Psychiatry: Judgement and insight appear normal. Mood is appropriate for condition and setting          Labs on Admission:  Basic Metabolic Panel: Recent Labs  Lab 07/09/20 1151  NA 143  K 3.6  CL 103  CO2 32  GLUCOSE 98  BUN 17  CREATININE 1.11  CALCIUM 9.4   Liver Function Tests: Recent Labs  Lab 07/09/20 1151  AST 36  ALT 33  ALKPHOS 55  BILITOT 0.8  PROT 7.3  ALBUMIN 3.8   No results for input(s): LIPASE, AMYLASE in the last 168 hours. No results for input(s): AMMONIA in the last 168 hours. CBC: Recent Labs  Lab 07/09/20 1151  WBC  6.4  HGB 13.7  HCT 42.5  MCV 95.1  PLT 123*   Cardiac Enzymes: No results for input(s): CKTOTAL, CKMB, CKMBINDEX, TROPONINI in the last 168 hours.  BNP (last 3 results) No results for input(s): BNP in the last 8760 hours.  ProBNP (last 3 results) No results for input(s): PROBNP in the last 8760 hours.  CBG: No results for input(s): GLUCAP in the last 168 hours.  Radiological Exams on Admission: CT Angio Abd/Pel W and/or Wo Contrast  Result Date: 07/09/2020 CLINICAL DATA:  Rectal bleeding EXAM: CTA ABDOMEN AND PELVIS WITHOUT AND WITH CONTRAST TECHNIQUE: Multidetector CT imaging of the abdomen and pelvis was performed using the standard protocol during bolus administration of intravenous contrast. Multiplanar reconstructed images and MIPs were obtained and reviewed to evaluate the vascular anatomy. CONTRAST:  OMNIPAQUE IOHEXOL 350 MG/ML SOLN COMPARISON:  12/05/2018 FINDINGS: VASCULAR Aorta: Mild calcified atheromatous plaque. No aneurysm, dissection, or stenosis. Celiac: Patent without evidence of aneurysm, dissection, vasculitis or significant stenosis.  SMA: Patent without evidence of aneurysm, dissection, vasculitis or significant stenosis. Renals: Both renal arteries are patent without evidence of aneurysm, dissection, vasculitis, fibromuscular dysplasia or significant stenosis. IMA: Diminutive, appears patent proximally. Inflow: Mild tortuosity.  No aneurysm, dissection, or stenosis. Proximal Outflow: Bilateral common femoral and visualized portions of the superficial and profunda femoral arteries are patent without evidence of aneurysm, dissection, vasculitis or significant stenosis. Veins: No obvious venous abnormality within the limitations of this arterial phase study. Portal vein patent. Review of the MIP images confirms the above findings. NON-VASCULAR Lower chest: 6 no pleural or pericardial effusion. Linear scarring or subsegmental atelectasis posteriorly at both lung bases. Hepatobiliary: No focal liver lesion. Gallbladder not visualized. Small amount of gas in centrally in the decompressed biliary tree suggesting previous sphincterotomy. Pancreas: Unremarkable. No pancreatic ductal dilatation or surrounding inflammatory changes. Spleen: Normal in size without focal abnormality. Adrenals/Urinary Tract: 2.2 cm low-attenuation left adrenal nodule; a 2 cm lesion was described on prior CT 04/16/2009 suggesting this represents a benign adenoma. No urolithiasis or hydronephrosis. Symmetric renal parenchymal enhancement. Urinary bladder incompletely distended. Stomach/Bowel: Stomach is decompressed. Small bowel nondilated. Normal appendix. The colon is nondilated. Scattered descending segment diverticula. Short-segment of persistent wall thickening in the proximal sigmoid colon without evidence of obstruction. No definite active extravasation, sensitivity decreased in the absence of delayed scans. Lymphatic: No abdominal or pelvic adenopathy localized. Reproductive: Prostate is unremarkable. Other: Obesity. No ascites. Bilateral pelvic phleboliths. No ascites.  Musculoskeletal: Small paraumbilical hernia containing only mesenteric fat. Multilevel spondylitic change in the thoracolumbar spine. No displaced fracture or worrisome bone lesion. IMPRESSION: 1. No definite active extravasation, sensitivity decreased in the absence of delayed scans. 2. Persistent short-segment wall thickening in the proximal sigmoid colon without obstruction. Follow-up colonoscopy or BE may be useful to exclude mucosal lesion. 3. Descending colon diverticulosis. 4. Small paraumbilical hernia containing only mesenteric fat. 5. Stable left adrenal adenoma. Aortic Atherosclerosis (ICD10-I70.0). Electronically Signed   By: Corlis Leak M.D.   On: 07/09/2020 14:16    EKG: I independently viewed the EKG done and my findings are as followed: None available at the time of this dictation.  Assessment/Plan Present on Admission: . GI bleed  Active Problems:   GI bleed  Presumed lower GI bleed Presented with sudden onset bright red blood per rectum this a.m. Denies prior history of GI bleed States he has had 2 prior colonoscopies with no significant findings. Self-reported last colonoscopy was in 2013. He had a CTA abdomen and  pelvis with and without contrast which revealed persistent short segment wall thickening in the proximal sigmoid colon without obstruction.  Per radiology report, follow-up colonoscopy or BE may be useful to exclude mucosal lesion; descending colon diverticulosis. Obtain FOBT to confirm GI bleed Consult GI in the morning due to abnormal CT scan. Hemoglobin stable at 13.7k with baseline of 14.4k in 2020 Denies recurrent GI bleed while in the ED. Repeat CBC tonight. CBC in the a.m.  Abnormal abdominal CT scan Findings as stated above Consult GI in the morning to further assess  Essential hypertension BP is not at goal Resume home oral antihypertensives. Add IV antihypertensives as needed with parameters. Continue to closely monitor vital  signs.  Hypothyroidism Resume home levothyroxine  COPD with chronic hypoxemia Self reported on 3 L nasal cannula continuously Per current vital signs he has been saturating 98-100% on room air  OSA on CPAP Reports compliance Resume CPAP at night  Chronic anxiety/GERD/gout/osteoarthritis Resume home regimen   DVT prophylaxis: SCDs due to reported GI bleed.  Code Status: Full code as reported by the patient himself.  Family Communication: None at bedside.  Disposition Plan: Admit to MedSurg unit with remote telemetry.  Consults called: Please consult GI in the morning.  Admission status: Observation status.   Status is: Observation    Dispo:  Patient From: Home  Planned Disposition: Home  Anticipated discharge date 07/10/2020 or when GI signs off.  Medically stable for discharge: No, ongoing management of hematochezia.      Darlin Drop MD [facility] Pager [phone]  If 7PM-7AM, please contact night-coverage [email or web address] Password [email or web address]  07/09/2020, 8:20 PM

## 2020-07-09 NOTE — Consult Note (Signed)
Melodie Bouillon, MD [address], [address], [place], [place], [address] [address], [place], [place], [address] Phone: [phone]  Fax: [phone]  Consultation  Referring Provider:     Dr. Margo Aye Primary Care Physician:  Barbette Reichmann, MD Reason for Consultation:     Hematochezia  Date of Admission:  07/09/2020 Date of Consultation:  07/09/2020         HPI:   Jay Sandoval is a 80 y.o. male who reports 1 episode of hematochezia this morning around 9 AM.  Reports having 1 large output with red blood.  Did not have any stool with it.  Had clots with it.  No further episodes since then.  No nausea or vomiting.  No prior history of GI bleed.  Denies any NSAID use.  No hematemesis.  No melena.  Reports having a colonoscopy in 2014.  I do not see a procedure report but there was an encounter with Dr. Marva Panda at that time.  Patient states small polyps were found.  Past Medical History:  Diagnosis Date  . Arthritis   . Asthma   . Hypertension   . Osteoporosis     Past Surgical History:  Procedure Laterality Date  . KNEE ARTHROPLASTY    . SHOULDER ARTHROSCOPY DISTAL CLAVICLE EXCISION AND OPEN ROTATOR CUFF REPAIR      Prior to Admission medications   Medication Sig Start Date End Date Taking? Authorizing Provider  albuterol (PROVENTIL) (2.5 MG/3ML) 0.083% nebulizer solution Take 2.5 mg by nebulization every 6 (six) hours as needed. 03/04/16  Yes [provider]  albuterol (VENTOLIN HFA) 108 (90 Base) MCG/ACT inhaler Inhale 2 puffs into the lungs every 6 (six) hours as needed for wheezing or shortness of breath.   Yes [provider]  allopurinol (ZYLOPRIM) 100 MG tablet Take 200 mg by mouth daily. 05/27/14  Yes [provider]  amLODipine-atorvastatin (CADUET) 5-10 MG tablet Take 1 tablet by mouth daily. 02/28/16  Yes [provider]  ascorbic acid (VITAMIN C) 500 MG tablet Take 500 mg by mouth daily.   Yes [provider]   aspirin EC 81 MG tablet Take 81 mg by mouth daily. Swallow whole.   Yes [provider]  atenolol (TENORMIN) 50 MG tablet Take 50 mg by mouth daily. 02/15/16  Yes [provider]  benazepril (LOTENSIN) 20 MG tablet Take 20 mg by mouth daily. 02/25/16  Yes [provider]  clotrimazole-betamethasone (LOTRISONE) cream Apply 1 application topically 2 (two) times daily. 04/19/18  Yes [provider]  Coenzyme Q10 100 MG capsule Take 100 mg by mouth 2 (two) times daily.   Yes [provider]  DULoxetine (CYMBALTA) 60 MG capsule Take 60 mg by mouth daily.   Yes [provider]  fluticasone (FLONASE) 50 MCG/ACT nasal spray Place 2 sprays into both nostrils daily as needed.   Yes [provider]  levothyroxine (SYNTHROID) 25 MCG tablet TAKE 1 TABLET BY MOUTH ONCE DAILY ON AN EMPTY STOMACH WITH A GLASS OF WATER AT LEAST 30 60 MINUTES BEFORE BREAKFAST 10/13/18  Yes [provider]  meloxicam (MOBIC) 15 MG tablet Take 15 mg by mouth daily as needed for pain.   Yes [provider]  Multiple Vitamins-Minerals (MULTIVITAL PO) Take 1 tablet by mouth daily.   Yes [provider]  nystatin cream (MYCOSTATIN) Apply 1 application topically daily as needed. 02/25/16  Yes [provider]  omega-3 fish oil (MAXEPA) 1000 MG CAPS capsule Take  2 capsules by mouth 2 (two) times daily.   Yes [provider]  omeprazole (PRILOSEC) 20 MG capsule Take 20 mg by mouth daily.   Yes [provider]  potassium chloride SA (K-DUR) 20 MEQ tablet Take 20 mEq by mouth daily. 09/06/18  Yes [provider]  pregabalin (LYRICA) 75 MG capsule Take 75 mg by mouth 2 (two) times daily. 12/06/18  Yes [provider]  pyridOXINE (VITAMIN B-6) 100 MG tablet Take 100 mg by mouth daily.   Yes [provider]  RESTASIS 0.05 % ophthalmic emulsion Place 1 drop into both eyes 2 (two) times daily. 02/25/16  Yes  [provider]  senna-docusate (SENOKOT-S) 8.6-50 MG tablet Take 2 tablets by mouth at bedtime.   Yes [provider]  tiotropium (SPIRIVA) 18 MCG inhalation capsule Place 18 mcg into inhaler and inhale daily.   Yes [provider]  tiZANidine (ZANAFLEX) 4 MG tablet Take 4 mg by mouth 2 (two) times daily as needed. 03/21/16  Yes [provider]  metolazone (ZAROXOLYN) 2.5 MG tablet Take 2.5 mg by mouth every 7 (seven) days.    [provider]  torsemide (DEMADEX) 20 MG tablet Take 20 mg by mouth daily.    [provider]    History reviewed. No pertinent family history.   Social History   Tobacco Use  . Smoking status: Former Smoker    Quit date: 05/15/2015    Years since quitting: 5.1  . Smokeless tobacco: Never Used  Substance Use Topics  . Alcohol use: No  . Drug use: Never    Allergies as of 07/09/2020  . (No Known Allergies)    Review of Systems:    All systems reviewed and negative except where noted in HPI.   Physical Exam:  Vital signs in last 24 hours: Vitals:   07/09/20 1126 07/09/20 1130 07/09/20 1231 07/09/20 1425  BP: (!) 150/72  (!) 151/72 (!) 155/79  Pulse: 75  98 64  Resp: 17  (!) 27 18  Temp: 98.5 F (36.9 C)     TempSrc: Oral     SpO2: 98%  92% 100%  Weight:  (!) 185 kg    Height:   (1.753 m)       General:   Pleasant, cooperative in NAD Head:  Normocephalic and atraumatic. Eyes:   No icterus.   Conjunctiva pink. PERRLA. Ears:  Normal auditory acuity. Neck:  Supple; no masses or thyroidomegaly Lungs: Respirations even and unlabored. Lungs clear to auscultation bilaterally.   No wheezes, crackles, or rhonchi.  Abdomen:  Soft, nondistended, nontender. Normal bowel sounds. No appreciable masses or hepatomegaly.  No rebound or guarding.  Neurologic:  Alert and oriented x3;  grossly normal neurologically. Skin:  Intact without significant lesions or rashes. Cervical Nodes:  No significant  cervical adenopathy. Psych:  Alert and cooperative. Normal affect.  LAB RESULTS: Recent Labs    07/09/20 1151  WBC 6.4  HGB 13.7  HCT 42.5  PLT 123*   BMET Recent Labs    07/09/20 1151  NA 143  K 3.6  CL 103  CO2 32  GLUCOSE 98  BUN 17  CREATININE 1.11  CALCIUM 9.4   LFT Recent Labs    07/09/20 1151  PROT 7.3  ALBUMIN 3.8  AST 36  ALT 33  ALKPHOS 55  BILITOT 0.8   PT/INR Recent Labs    07/09/20 1535  LABPROT 13.4  INR 1.1    STUDIES: CT Angio  Abd/Pel W and/or Wo Contrast  Result Date: 07/09/2020 CLINICAL DATA:  Rectal bleeding EXAM: CTA ABDOMEN AND PELVIS WITHOUT AND WITH CONTRAST TECHNIQUE: Multidetector CT imaging of the abdomen and pelvis was performed using the standard protocol during bolus administration of intravenous contrast. Multiplanar reconstructed images and MIPs were obtained and reviewed to evaluate the vascular anatomy. CONTRAST:  OMNIPAQUE IOHEXOL 350 MG/ML SOLN COMPARISON:  12/05/2018 FINDINGS: VASCULAR Aorta: Mild calcified atheromatous plaque. No aneurysm, dissection, or stenosis. Celiac: Patent without evidence of aneurysm, dissection, vasculitis or significant stenosis. SMA: Patent without evidence of aneurysm, dissection, vasculitis or significant stenosis. Renals: Both renal arteries are patent without evidence of aneurysm, dissection, vasculitis, fibromuscular dysplasia or significant stenosis. IMA: Diminutive, appears patent proximally. Inflow: Mild tortuosity.  No aneurysm, dissection, or stenosis. Proximal Outflow: Bilateral common femoral and visualized portions of the superficial and profunda femoral arteries are patent without evidence of aneurysm, dissection, vasculitis or significant stenosis. Veins: No obvious venous abnormality within the limitations of this arterial phase study. Portal vein patent. Review of the MIP images confirms the above findings. NON-VASCULAR Lower chest: 6 no pleural or pericardial effusion. Linear  scarring or subsegmental atelectasis posteriorly at both lung bases. Hepatobiliary: No focal liver lesion. Gallbladder not visualized. Small amount of gas in centrally in the decompressed biliary tree suggesting previous sphincterotomy. Pancreas: Unremarkable. No pancreatic ductal dilatation or surrounding inflammatory changes. Spleen: Normal in size without focal abnormality. Adrenals/Urinary Tract: 2.2 cm low-attenuation left adrenal nodule; a 2 cm lesion was described on prior CT 04/16/2009 suggesting this represents a benign adenoma. No urolithiasis or hydronephrosis. Symmetric renal parenchymal enhancement. Urinary bladder incompletely distended. Stomach/Bowel: Stomach is decompressed. Small bowel nondilated. Normal appendix. The colon is nondilated. Scattered descending segment diverticula. Short-segment of persistent wall thickening in the proximal sigmoid colon without evidence of obstruction. No definite active extravasation, sensitivity decreased in the absence of delayed scans. Lymphatic: No abdominal or pelvic adenopathy localized. Reproductive: Prostate is unremarkable. Other: Obesity. No ascites. Bilateral pelvic phleboliths. No ascites. Musculoskeletal: Small paraumbilical hernia containing only mesenteric fat. Multilevel spondylitic change in the thoracolumbar spine. No displaced fracture or worrisome bone lesion. IMPRESSION: 1. No definite active extravasation, sensitivity decreased in the absence of delayed scans. 2. Persistent short-segment wall thickening in the proximal sigmoid colon without obstruction. Follow-up colonoscopy or BE may be useful to exclude mucosal lesion. 3. Descending colon diverticulosis. 4. Small paraumbilical hernia containing only mesenteric fat. 5. Stable left adrenal adenoma. Aortic Atherosclerosis (ICD10-I70.0). Electronically Signed   By: Corlis Leak M.D.   On: 07/09/2020 14:16      Impression / Plan:   TRAVIUS CROCHET is a 80 y.o. y/o male with  hematochezia  Findings most suspicious for lower GI bleed  Differentials include diverticulosis versus hemorrhoids versus AVMs Malignancy, ischemic colitis are also in the differential, but lower on the differential  CTA did not show any active GI bleeding  Colonoscopy indicated for further evaluation of hematochezia  No evidence of upper GI bleed at this time  However given that patient has meloxicam as needed listed on his medication list, but patient does not remember taking it frequently, will preventatively place him on PPI IV twice daily for now.  If clinical status warrants an upper endoscopy, can proceed with upper endoscopy along with a colonoscopy  However, no clear evidence of upper GI bleed at this time as patient does not have any melena, hematochezia and is hemodynamically stable otherwise  Relatively stable hemoglobin from baseline is also most consistent with  lower GI bleed  However, repeat hemoglobin will be more reflective of current true hemoglobin level  PPI IV twice daily  Continue serial CBCs and transfuse PRN Avoid NSAIDs Maintain 2 large-bore IV lines Please page GI with any acute hemodynamic changes, or signs of active GI bleeding   Thank you for involving me in the care of this patient.      LOS: 0 days   Pasty Spillers, MD  07/09/2020, 4:11 PM

## 2020-07-10 ENCOUNTER — Observation Stay: Payer: Medicare Other | Admitting: Anesthesiology

## 2020-07-10 ENCOUNTER — Encounter
Admission: EM | Disposition: A | Discharge status: Home / Self Care | Payer: Medicare Other | Source: Home / Self Care | Attending: Student in an Organized Health Care Education/Training Program

## 2020-07-10 ENCOUNTER — Encounter: Payer: Self-pay | Admitting: Gastroenterology

## 2020-07-10 DIAGNOSIS — K921 Melena: Secondary | ICD-10-CM | POA: Diagnosis not present

## 2020-07-10 DIAGNOSIS — K635 Polyp of colon: Secondary | ICD-10-CM

## 2020-07-10 DIAGNOSIS — K625 Hemorrhage of anus and rectum: Secondary | ICD-10-CM

## 2020-07-10 DIAGNOSIS — D62 Acute posthemorrhagic anemia: Secondary | ICD-10-CM

## 2020-07-10 DIAGNOSIS — K5791 Diverticulosis of intestine, part unspecified, without perforation or abscess with bleeding: Secondary | ICD-10-CM

## 2020-07-10 HISTORY — PX: COLONOSCOPY: SHX5424

## 2020-07-10 LAB — CBC
HCT: 37.2 % — ABNORMAL LOW (ref 39.0–52.0)
Hemoglobin: 12.1 g/dL — ABNORMAL LOW (ref 13.0–17.0)
MCH: 30.4 pg (ref 26.0–34.0)
MCHC: 32.5 g/dL (ref 30.0–36.0)
MCV: 93.5 fL (ref 80.0–100.0)
Platelets: 123 10*3/uL — ABNORMAL LOW (ref 150–400)
RBC: 3.98 MIL/uL — ABNORMAL LOW (ref 4.22–5.81)
RDW: 13.5 % (ref 11.5–15.5)
WBC: 7 10*3/uL (ref 4.0–10.5)
nRBC: 0 % (ref 0.0–0.2)

## 2020-07-10 LAB — COMPREHENSIVE METABOLIC PANEL
ALT: 49 U/L — ABNORMAL HIGH (ref 0–44)
AST: 45 U/L — ABNORMAL HIGH (ref 15–41)
Albumin: 3.5 g/dL (ref 3.5–5.0)
Alkaline Phosphatase: 49 U/L (ref 38–126)
Anion gap: 8 (ref 5–15)
BUN: 14 mg/dL (ref 8–23)
CO2: 27 mmol/L (ref 22–32)
Calcium: 9.2 mg/dL (ref 8.9–10.3)
Chloride: 110 mmol/L (ref 98–111)
Creatinine, Ser: 0.85 mg/dL (ref 0.61–1.24)
GFR, Estimated: >60 mL/min (ref >60)
Glucose, Bld: 103 mg/dL — ABNORMAL HIGH (ref 70–99)
Potassium: 3.5 mmol/L (ref 3.5–5.1)
Sodium: 145 mmol/L (ref 135–145)
Total Bilirubin: 1.1 mg/dL (ref 0.3–1.2)
Total Protein: 6.5 g/dL (ref 6.5–8.1)

## 2020-07-10 LAB — MAGNESIUM: Magnesium: 2 mg/dL (ref 1.7–2.4)

## 2020-07-10 SURGERY — COLONOSCOPY
Anesthesia: General

## 2020-07-10 MED ORDER — IPRATROPIUM-ALBUTEROL 0.5-2.5 (3) MG/3ML IN SOLN
RESPIRATORY_TRACT | Status: AC
  Administered 2020-07-10: 3 mL via RESPIRATORY_TRACT
  Filled 2020-07-10: qty 3

## 2020-07-10 MED ORDER — GLYCOPYRROLATE 0.2 MG/ML IJ SOLN
INTRAMUSCULAR | Status: DC | PRN
  Administered 2020-07-10: .2 mg via INTRAVENOUS

## 2020-07-10 MED ORDER — KETAMINE HCL 50 MG/5ML IJ SOSY
PREFILLED_SYRINGE | INTRAMUSCULAR | Status: AC
  Filled 2020-07-10: qty 5

## 2020-07-10 MED ORDER — KETAMINE HCL 50 MG/ML IJ SOLN
INTRAMUSCULAR | Status: DC | PRN
  Administered 2020-07-10: 25 mg via INTRAVENOUS

## 2020-07-10 MED ORDER — DEXMEDETOMIDINE (PRECEDEX) IN NS 20 MCG/5ML (4 MCG/ML) IV SYRINGE
PREFILLED_SYRINGE | INTRAVENOUS | Status: AC
  Filled 2020-07-10: qty 5

## 2020-07-10 MED ORDER — PROPOFOL 10 MG/ML IV BOLUS
INTRAVENOUS | Status: DC | PRN
  Administered 2020-07-10: 50 mg via INTRAVENOUS

## 2020-07-10 MED ORDER — PROPOFOL 500 MG/50ML IV EMUL
INTRAVENOUS | Status: DC | PRN
  Administered 2020-07-10: 80 ug/kg/min via INTRAVENOUS

## 2020-07-10 MED ORDER — IPRATROPIUM-ALBUTEROL 0.5-2.5 (3) MG/3ML IN SOLN: 3.0000 mL | Freq: Once | RESPIRATORY_TRACT | Status: AC

## 2020-07-10 MED ORDER — DEXMEDETOMIDINE (PRECEDEX) IN NS 20 MCG/5ML (4 MCG/ML) IV SYRINGE
PREFILLED_SYRINGE | INTRAVENOUS | Status: DC | PRN
  Administered 2020-07-10: 20 ug via INTRAVENOUS

## 2020-07-10 MED ORDER — SODIUM CHLORIDE 0.9 % IV SOLN
INTRAVENOUS | Status: DC
  Administered 2020-07-10: 20 mL/h via INTRAVENOUS

## 2020-07-10 NOTE — Progress Notes (Signed)
Melodie Bouillon, MD [address], [address], [place], [place], [address] [address], [place], [place], [address] Phone: [phone]  Fax: [phone]   Subjective: Patient reports that blood in stool today morning after his prep.  No abdominal pain.  No nausea or vomiting   Objective: Exam: Vital signs in last 24 hours: Vitals:   07/09/20 2327 07/10/20 0430 07/10/20 0820 07/10/20 1134  BP: (!) 151/71 (!) 152/65 (!) 144/62 107/75  Pulse: 64 (!) 53 80 83  Resp: 18 16 18 20   Temp: 97.9 F (36.6 C) 98.2 F (36.8 C) 98.3 F (36.8 C) 97.9 F (36.6 C)  TempSrc: Oral   Temporal  SpO2: 99% 97% 97% 97%  Weight:      Height:       Weight change:   Intake/Output Summary (Last 24 hours) at 07/10/2020 1318 Last data filed at 07/10/2020 0700 Gross per 24 hour  Intake --  Output 350 ml  Net -350 ml    General: No acute distress, AAO x3 Abd: Soft, NT/ND, No HSM Skin: Warm, no rashes Neck: Supple, Trachea midline   Lab Results: Lab Results  Component Value Date   WBC 7.0 07/10/2020   HGB 12.1 (L) 07/10/2020   HCT 37.2 (L) 07/10/2020   MCV 93.5 07/10/2020   PLT 123 (L) 07/10/2020   Micro Results: Recent Results (from the past 240 hour(s))  Resp Panel by RT-PCR (Flu A&B, Covid) Nasopharyngeal Swab     Status: None   Collection Time: 07/09/20  2:30 PM   Specimen: Nasopharyngeal Swab; Nasopharyngeal(NP) swabs in vial transport medium  Result Value Ref Range Status   SARS Coronavirus 2 by RT PCR NEGATIVE NEGATIVE Final    Comment: (NOTE) SARS-CoV-2 target nucleic acids are NOT DETECTED.  The SARS-CoV-2 RNA is generally detectable in upper respiratory specimens during the acute phase of infection. The lowest concentration of SARS-CoV-2 viral copies this assay can detect is 138 copies/mL. A negative result does not preclude SARS-Cov-2 infection and should not be used as the sole basis for treatment or other patient management decisions. A negative result  may occur with  improper specimen collection/handling, submission of specimen other than nasopharyngeal swab, presence of viral mutation(s) within the areas targeted by this assay, and inadequate number of viral copies(<138 copies/mL). A negative result must be combined with clinical observations, patient history, and epidemiological information. The expected result is Negative.  Fact Sheet for Patients:  07/11/20  Fact Sheet for Healthcare Providers:  BloggerCourse.com  This test is no t yet approved or cleared by the [employer] FDA and  has been authorized for detection and/or diagnosis of SARS-CoV-2 by FDA under an Emergency Use Authorization (EUA). This EUA will remain  in effect (meaning this test can be used) for the duration of the COVID-19 declaration under Section 564(b)(1) of the Act, 21 U.S.C.section 360bbb-3(b)(1), unless the authorization is terminated  or revoked sooner.       Influenza A by PCR NEGATIVE NEGATIVE Final   Influenza B by PCR NEGATIVE NEGATIVE Final    Comment: (NOTE) The Xpert Xpress SARS-CoV-2/FLU/RSV plus assay is intended as an aid in the diagnosis of influenza from Nasopharyngeal swab specimens and should not be used as a sole basis for treatment. Nasal washings and aspirates are unacceptable for Xpert Xpress SARS-CoV-2/FLU/RSV testing.  Fact Sheet for Patients: [place]  Fact Sheet for Healthcare Providers: BloggerCourse.com  This test is not yet approved or cleared by the [employer] FDA and has been  authorized for detection and/or diagnosis of SARS-CoV-2 by FDA under an Emergency Use Authorization (EUA). This EUA will remain in effect (meaning this test can be used) for the duration of the COVID-19 declaration under Section 564(b)(1) of the Act, 21 U.S.C. section 360bbb-3(b)(1), unless the authorization is terminated  or revoked.  Performed at [facility], [address] [place], [place] [address]    Studies/Results: CT Angio Abd/Pel W and/or Wo Contrast  Result Date: 07/09/2020 CLINICAL DATA:  Rectal bleeding EXAM: CTA ABDOMEN AND PELVIS WITHOUT AND WITH CONTRAST TECHNIQUE: Multidetector CT imaging of the abdomen and pelvis was performed using the standard protocol during bolus administration of intravenous contrast. Multiplanar reconstructed images and MIPs were obtained and reviewed to evaluate the vascular anatomy. CONTRAST:  OMNIPAQUE IOHEXOL 350 MG/ML SOLN COMPARISON:  12/05/2018 FINDINGS: VASCULAR Aorta: Mild calcified atheromatous plaque. No aneurysm, dissection, or stenosis. Celiac: Patent without evidence of aneurysm, dissection, vasculitis or significant stenosis. SMA: Patent without evidence of aneurysm, dissection, vasculitis or significant stenosis. Renals: Both renal arteries are patent without evidence of aneurysm, dissection, vasculitis, fibromuscular dysplasia or significant stenosis. IMA: Diminutive, appears patent proximally. Inflow: Mild tortuosity.  No aneurysm, dissection, or stenosis. Proximal Outflow: Bilateral common femoral and visualized portions of the superficial and profunda femoral arteries are patent without evidence of aneurysm, dissection, vasculitis or significant stenosis. Veins: No obvious venous abnormality within the limitations of this arterial phase study. Portal vein patent. Review of the MIP images confirms the above findings. NON-VASCULAR Lower chest: 6 no pleural or pericardial effusion. Linear scarring or subsegmental atelectasis posteriorly at both lung bases. Hepatobiliary: No focal liver lesion. Gallbladder not visualized. Small amount of gas in centrally in the decompressed biliary tree suggesting previous sphincterotomy. Pancreas: Unremarkable. No pancreatic ductal dilatation or surrounding inflammatory changes. Spleen: Normal in size without focal  abnormality. Adrenals/Urinary Tract: 2.2 cm low-attenuation left adrenal nodule; a 2 cm lesion was described on prior CT 04/16/2009 suggesting this represents a benign adenoma. No urolithiasis or hydronephrosis. Symmetric renal parenchymal enhancement. Urinary bladder incompletely distended. Stomach/Bowel: Stomach is decompressed. Small bowel nondilated. Normal appendix. The colon is nondilated. Scattered descending segment diverticula. Short-segment of persistent wall thickening in the proximal sigmoid colon without evidence of obstruction. No definite active extravasation, sensitivity decreased in the absence of delayed scans. Lymphatic: No abdominal or pelvic adenopathy localized. Reproductive: Prostate is unremarkable. Other: Obesity. No ascites. Bilateral pelvic phleboliths. No ascites. Musculoskeletal: Small paraumbilical hernia containing only mesenteric fat. Multilevel spondylitic change in the thoracolumbar spine. No displaced fracture or worrisome bone lesion. IMPRESSION: 1. No definite active extravasation, sensitivity decreased in the absence of delayed scans. 2. Persistent short-segment wall thickening in the proximal sigmoid colon without obstruction. Follow-up colonoscopy or BE may be useful to exclude mucosal lesion. 3. Descending colon diverticulosis. 4. Small paraumbilical hernia containing only mesenteric fat. 5. Stable left adrenal adenoma. Aortic Atherosclerosis (ICD10-I70.0). Electronically Signed   By: Corlis Leak M.D.   On: 07/09/2020 14:16   Medications:  Scheduled Meds: . [MAR Hold] allopurinol  200 mg Oral Daily  . [MAR Hold] amLODipine  5 mg Oral Daily   And  . [MAR Hold] atorvastatin  10 mg Oral Daily  . [MAR Hold] atenolol  50 mg Oral Daily  . [MAR Hold] benazepril  20 mg Oral Daily  . [MAR Hold] DULoxetine  60 mg Oral Daily  . [MAR Hold] levothyroxine  25 mcg Oral Q0600  . [MAR Hold] pantoprazole (PROTONIX) IV  40 mg Intravenous Q12H  . [facility]  Hold] pregabalin  75 mg Oral BID   . [MAR Hold] tiotropium  18 mcg Inhalation Daily   Continuous Infusions: . sodium chloride 20 mL/hr (07/10/20 1133)   PRN Meds:.[MAR Hold] albuterol, [MAR Hold] hydrALAZINE   Assessment: Active Problems:   GI bleed    Plan: Proceed with colonoscopy today for evaluation of hematochezia  I have discussed alternative options, risks & benefits,  which include, but are not limited to, bleeding, infection, perforation,respiratory complication & drug reaction.  The patient agrees with this plan & written consent will be obtained.    Hemoglobin did drop to 12.1 compared to 13.7 on admission.  1 g drop, but patient remains hemodynamically stable    LOS: 0 days   Melodie Bouillon, MD 07/10/2020, 1:18 PM

## 2020-07-10 NOTE — Anesthesia Preprocedure Evaluation (Addendum)
Anesthesia Evaluation  Patient identified by MRN, date of birth, ID band Patient awake    Reviewed: Allergy & Precautions, H&P , NPO status , Patient's Chart, lab work & pertinent test results  History of Anesthesia Complications Negative for: history of anesthetic complications  Airway Mallampati: III  TM Distance: >3 FB     Dental  (+) Teeth Intact   Pulmonary asthma , sleep apnea , COPD,  COPD inhaler and oxygen dependent, former smoker,     + decreased breath sounds+ wheezing      Cardiovascular hypertension, (-) angina+CHF  (-) Past MI and (-) Cardiac Stents (-) dysrhythmias  Rhythm:regular Rate:Normal  Most recent echo 2017: NORMAL LEFT VENTRICULAR SYSTOLIC FUNCTION WITH AN ESTIMATED EF = 55 %  NORMAL RIGHT VENTRICULAR SYSTOLIC FUNCTION  MILD TRICUSPID AND MITRAL VALVE INSUFFICIENCY  NO VALVULAR STENOSIS  MODERATE LVH  MILD RV ENLARGEMENT  MILD RA ENLARGEMENT      Neuro/Psych negative neurological ROS  negative psych ROS   GI/Hepatic Neg liver ROS, GERD  ,  Endo/Other  Morbid obesity (super morbid obesity BMI 60)  Renal/GU negative Renal ROS  negative genitourinary   Musculoskeletal   Abdominal   Peds  Hematology negative hematology ROS (+)   Anesthesia Other Findings Wheelchair bound (super morbid obesity, arthritis)  Past Medical History: No date: Arthritis No date: Asthma No date: Hypertension No date: Osteoporosis  Past Surgical History: No date: KNEE ARTHROPLASTY No date: SHOULDER ARTHROSCOPY DISTAL CLAVICLE EXCISION AND OPEN  ROTATOR CUFF REPAIR  BMI    Body Mass Index: 60.23 kg/m      Reproductive/Obstetrics negative OB ROS                            Anesthesia Physical Anesthesia Plan  ASA: III  Anesthesia Plan: General   Post-op Pain Management:    Induction:   PONV Risk Score and Plan: Propofol infusion and TIVA  Airway Management Planned: Nasal  CPAP  Additional Equipment:   Intra-op Plan:   Post-operative Plan:   Informed Consent: I have reviewed the patients History and Physical, chart, labs and discussed the procedure including the risks, benefits and alternatives for the proposed anesthesia with the patient or authorized representative who has indicated his/her understanding and acceptance.     Dental Advisory Given  Plan Discussed with: Anesthesiologist, CRNA and Surgeon  Anesthesia Plan Comments: (Duoneb pre-op)       Anesthesia Quick Evaluation

## 2020-07-10 NOTE — Op Note (Signed)
[facility] Gastroenterology Patient Name: Jay Sandoval Procedure Date: 07/10/2020 1:11 PM MRN: [id number] Account #: [id number] Date of Birth: Nov 22, 1940 Admit Type: Ambulatory Age: 80 Room: [facility] ENDO ROOM 2 Gender: Male Note Status: Finalized Procedure:             Colonoscopy Indications:           Hematochezia Providers:             Tawsha Terrero B. Maximino Greenland MD, MD Referring MD:          Barbette Reichmann, MD (Referring MD) Medicines:             Monitored Anesthesia Care Complications:         No immediate complications. Procedure:             Pre-Anesthesia Assessment:                        - ASA Grade Assessment: III - A patient with severe                         systemic disease.                        - Prior to the procedure, a History and Physical was                         performed, and patient medications, allergies and                         sensitivities were reviewed. The patient's tolerance                         of previous anesthesia was reviewed.                        - The risks and benefits of the procedure and the                         sedation options and risks were discussed with the                         patient. All questions were answered and informed                         consent was obtained.                        - Patient identification and proposed procedure were                         verified prior to the procedure by the physician, the                         nurse, the anesthesiologist, the anesthetist and the                         technician. The procedure was verified in the                         procedure room.  After obtaining informed consent, the colonoscope was                         passed under direct vision. Throughout the procedure,                         the patient's blood pressure, pulse, and oxygen                         saturations were monitored continuously. The                          Colonoscope was introduced through the anus and                         advanced to the the cecum, identified by appendiceal                         orifice and ileocecal valve. The colonoscopy was                         performed with ease. The patient tolerated the                         procedure well. The quality of the bowel preparation                         was fair except the ascending colon was poor and the                         cecum was poor. Findings:      The perianal and digital rectal examinations were normal.      A 5 mm polyp was found in the transverse colon. The polyp was sessile.       The polyp was removed with a cold snare. Resection and retrieval were       complete.      Hematin (altered blood/coffee-ground-like material) was found in the       sigmoid colon.      Multiple diverticula were found in the sigmoid colon.      The exam was otherwise without abnormality.      The retroflexed view of the distal rectum and anal verge was normal and       showed no anal or rectal abnormalities. Impression:            - Findings suggest resolved diverticular bleed due to                         the presence of old blood seen in the sigmoid colon.                         No active bleeding present.                        - One 5 mm polyp in the transverse colon, removed with                         a cold snare. Resected and retrieved.                        -  Blood in the sigmoid colon.                        - Diverticulosis in the sigmoid colon.                        - The examination was otherwise normal.                        - The distal rectum and anal verge are normal on                         retroflexion view. Recommendation:        - High fiber diet.                        - Advance diet as tolerated.                        - Continue present medications.                        - Await pathology results.                        - The  findings and recommendations were discussed with                         the patient.                        - The findings and recommendations were discussed with                         the patient's family.                        - Return to primary care physician as previously                         scheduled.                        - Return to my office in 2 weeks. Procedure Code(s):     --- Professional ---                        [phone], Colonoscopy, flexible; with removal of                         tumor(s), polyp(s), or other lesion(s) by snare                         technique Diagnosis Code(s):     --- Professional ---                        K63.5, Polyp of colon                        K92.2, Gastrointestinal hemorrhage, unspecified                        K92.1,  Melena (includes Hematochezia) CPT copyright 2019 American Medical Association. All rights reserved. The codes documented in this report are preliminary and upon coder review may  be revised to meet current compliance requirements.  Melodie Bouillon, MD Michel Bickers B. Maximino Greenland MD, MD 07/10/2020 1:52:59 PM This report has been signed electronically. Number of Addenda: 0 Note Initiated On: 07/10/2020 1:11 PM Scope Withdrawal Time: 0 hours 8 minutes 59 seconds  Total Procedure Duration: 0 hours 12 minutes 23 seconds  Estimated Blood Loss:  Estimated blood loss: none.      [facility]

## 2020-07-10 NOTE — Progress Notes (Signed)
Patient stated he does not want to wear cpap tonight.  Informed patient if he changed his mind, to let nurse know and a cpap machine would be provided.  SPO2: 95% on 3LPM oxygen.

## 2020-07-10 NOTE — TOC Transition Note (Signed)
Transition of Care [facility]) - CM/SW Discharge Note   Patient Details  Name: DIMITRIOUS MICCICHE MRN: [id number] Date of Birth: December 17, 1940  Transition of Care [facility]) CM/SW Contact:  Caryn Section, RN Phone Number: 07/10/2020, 4:19 PM   Clinical Narrative:   TOC in to see patient, being discharged to home.  Address [address] [place], [place].  [facility] notified of patient transport to home, patient has key and states he feels safe returning home alone.  He will call his aide when he feels like he needs her.  TOC signing off.    Final next level of care: Home/Self Care (Patient states he has Medicaid Aide) Barriers to Discharge: Barriers Resolved   Patient Goals and CMS Choice     Choice offered to / list presented to : NA  Discharge Placement                Patient to be transferred to facility by: Transferred home by [facility]   Patient and family notified of of transfer: 07/10/20  Discharge Plan and Services                DME Arranged:  (NA)   Date DME Agency Contacted:  (NA)     HH Arranged:  (NA)          Social Determinants of Health (SDOH) Interventions     Readmission Risk Interventions No flowsheet data found.

## 2020-07-10 NOTE — Discharge Summary (Signed)
Physician Discharge Summary  Jay Sandoval PTY:[id number] DOB: [birth year] DOA: 07/09/2020  PCP: Barbette Reichmann, MD  Admit date: 07/09/2020 Discharge date: 07/10/2020  Admitted From: Home  Disposition:  Home   Recommendations for Outpatient Follow-up:  1. Follow up with PCP Dr. Marcello Fennel in 1 week 2. Dr. Marcello Fennel: Please check LFTs, CBC in 1 week to follow up transaminases and Hgb 3. Follow up with GI Dr. Maximino Greenland in 4-6 weeks      Home Health: None new  Equipment/Devices: None new  Discharge Condition: Fair  CODE STATUS: FULL Diet recommendation: Regular  Brief/Interim Summary:   Jay Sandoval is a 80 y.o. M with hypothyroidism, HTN, gout, COPD on 3L home O2, obesity, OSA on CPAP who prsented with hematochezia.  Patient awoke morning of amidssion and had single episode bright red bloody stool. No prior GIB.  Mobic on med list, patient unsure of NSAID meaning.  In the ER, Hgb stable.  GI recommended colonoscopy.      PRINCIPAL HOSPITAL DIAGNOSIS: Acute diverticular bleed    Discharge Diagnoses:   Acute diverticular bleed Patient admitted and had insignificant drop in Hgb from 13->12 ovenright.    Had blood mixed with BM overnight, brown BM in morning.  Underwent colonoscopy that showed diverticuli, some old blood, and no active bleeding.       Elevated transaminases Mild.  Follow up with PCP to document resolution/normalization.   Hypothyroidism Continue levothyroxine  Gout Continue allopurinol  COPD with chronic hypoxic respiratory failure OSA Continue Spiriva.  Continue CPAP at night  Morbid obesity BMI >60  Hypertension Continue amlodipine, atenolol, benazepril  Depression Continue duloxetine                    Discharge Instructions  Discharge Instructions    Diet - low sodium heart healthy   Complete by: As directed    Discharge instructions   Complete by: As directed    From Dr. Maryfrances Bunnell: You were evaluated for rectal  bleeding This turned out to be from a diverticular bleed.   "Diverticuli" are small outpouchings in the colon, that are found naturally in many people.  Sometimes they bleed, although this is usually something that stops by itself.  Thankfully, nothing more serious was found on your colonoscopy.  You will likely have some small amounts of blood that still pass over the next few days.  If you have severe and INCREASING RED blood over the next few days, you should call your doctor  If you have worsening bleeding and feel dizzy or lightheaded you should come back to the ER.   Go see Dr. Maximino Greenland in 1 month  Go see your doctor in 1 week and have them check your labs  It is probably best NOT to take meloxicam/Mobic, because this has been associated with bleeding   Increase activity slowly   Complete by: As directed      Allergies as of 07/10/2020   No Known Allergies     Medication List    STOP taking these medications   meloxicam 15 MG tablet Commonly known as: MOBIC     TAKE these medications   albuterol 108 (90 Base) MCG/ACT inhaler Commonly known as: VENTOLIN HFA Inhale 2 puffs into the lungs every 6 (six) hours as needed for wheezing or shortness of breath.   albuterol (2.5 MG/3ML) 0.083% nebulizer solution Commonly known as: PROVENTIL Take 2.5 mg by nebulization every 6 (six) hours as needed.   allopurinol 100 MG tablet Commonly  known as: ZYLOPRIM Take 200 mg by mouth daily.   amLODipine-atorvastatin 5-10 MG tablet Commonly known as: CADUET Take 1 tablet by mouth daily.   ascorbic acid 500 MG tablet Commonly known as: VITAMIN C Take 500 mg by mouth daily.   aspirin EC 81 MG tablet Take 81 mg by mouth daily. Swallow whole.   atenolol 50 MG tablet Commonly known as: TENORMIN Take 50 mg by mouth daily.   benazepril 20 MG tablet Commonly known as: LOTENSIN Take 20 mg by mouth daily.   clotrimazole-betamethasone cream Commonly known as: LOTRISONE Apply 1  application topically 2 (two) times daily.   Coenzyme Q10 100 MG capsule Take 100 mg by mouth 2 (two) times daily.   DULoxetine 60 MG capsule Commonly known as: CYMBALTA Take 60 mg by mouth daily.   fluticasone 50 MCG/ACT nasal spray Commonly known as: FLONASE Place 2 sprays into both nostrils daily as needed.   levothyroxine 25 MCG tablet Commonly known as: SYNTHROID TAKE 1 TABLET BY MOUTH ONCE DAILY ON AN EMPTY STOMACH WITH A GLASS OF WATER AT LEAST 30 60 MINUTES BEFORE BREAKFAST   metolazone 2.5 MG tablet Commonly known as: ZAROXOLYN Take 2.5 mg by mouth every 7 (seven) days.   MULTIVITAL PO Take 1 tablet by mouth daily.   nystatin cream Commonly known as: MYCOSTATIN Apply 1 application topically daily as needed.   omega-3 fish oil 1000 MG Caps capsule Commonly known as: MAXEPA Take 2 capsules by mouth 2 (two) times daily.   omeprazole 20 MG capsule Commonly known as: PRILOSEC Take 20 mg by mouth daily.   potassium chloride SA 20 MEQ tablet Commonly known as: KLOR-CON Take 20 mEq by mouth daily.   pregabalin 75 MG capsule Commonly known as: LYRICA Take 75 mg by mouth 2 (two) times daily.   pyridOXINE 100 MG tablet Commonly known as: VITAMIN B-6 Take 100 mg by mouth daily.   Restasis 0.05 % ophthalmic emulsion Generic drug: cycloSPORINE Place 1 drop into both eyes 2 (two) times daily.   senna-docusate 8.6-50 MG tablet Commonly known as: Senokot-S Take 2 tablets by mouth at bedtime.   tiotropium 18 MCG inhalation capsule Commonly known as: SPIRIVA Place 18 mcg into inhaler and inhale daily.   tiZANidine 4 MG tablet Commonly known as: ZANAFLEX Take 4 mg by mouth 2 (two) times daily as needed.   torsemide 20 MG tablet Commonly known as: DEMADEX Take 20 mg by mouth daily.       Follow-up Information    Barbette Reichmann, MD. Schedule an appointment as soon as possible for a visit in 1 week(s).   Specialty: Internal Medicine Contact  information: [address] [place] [address] [phone]        Pasty Spillers, MD. Schedule an appointment as soon as possible for a visit in 1 month(s).   Specialty: Gastroenterology Contact information: [address] [place] [address] [phone]              No Known Allergies  Consultations:  Gastroenterology   Procedures/Studies: CT Angio Abd/Pel W and/or Wo Contrast  Result Date: 07/09/2020 CLINICAL DATA:  Rectal bleeding EXAM: CTA ABDOMEN AND PELVIS WITHOUT AND WITH CONTRAST TECHNIQUE: Multidetector CT imaging of the abdomen and pelvis was performed using the standard protocol during bolus administration of intravenous contrast. Multiplanar reconstructed images and MIPs were obtained and reviewed to evaluate the vascular anatomy. CONTRAST:  OMNIPAQUE IOHEXOL 350 MG/ML SOLN COMPARISON:  12/05/2018 FINDINGS: VASCULAR Aorta: Mild  calcified atheromatous plaque. No aneurysm, dissection, or stenosis. Celiac: Patent without evidence of aneurysm, dissection, vasculitis or significant stenosis. SMA: Patent without evidence of aneurysm, dissection, vasculitis or significant stenosis. Renals: Both renal arteries are patent without evidence of aneurysm, dissection, vasculitis, fibromuscular dysplasia or significant stenosis. IMA: Diminutive, appears patent proximally. Inflow: Mild tortuosity.  No aneurysm, dissection, or stenosis. Proximal Outflow: Bilateral common femoral and visualized portions of the superficial and profunda femoral arteries are patent without evidence of aneurysm, dissection, vasculitis or significant stenosis. Veins: No obvious venous abnormality within the limitations of this arterial phase study. Portal vein patent. Review of the MIP images confirms the above findings. NON-VASCULAR Lower chest: 6 no pleural or pericardial effusion. Linear scarring or subsegmental atelectasis posteriorly at both lung bases. Hepatobiliary: No focal  liver lesion. Gallbladder not visualized. Small amount of gas in centrally in the decompressed biliary tree suggesting previous sphincterotomy. Pancreas: Unremarkable. No pancreatic ductal dilatation or surrounding inflammatory changes. Spleen: Normal in size without focal abnormality. Adrenals/Urinary Tract: 2.2 cm low-attenuation left adrenal nodule; a 2 cm lesion was described on prior CT 04/16/2009 suggesting this represents a benign adenoma. No urolithiasis or hydronephrosis. Symmetric renal parenchymal enhancement. Urinary bladder incompletely distended. Stomach/Bowel: Stomach is decompressed. Small bowel nondilated. Normal appendix. The colon is nondilated. Scattered descending segment diverticula. Short-segment of persistent wall thickening in the proximal sigmoid colon without evidence of obstruction. No definite active extravasation, sensitivity decreased in the absence of delayed scans. Lymphatic: No abdominal or pelvic adenopathy localized. Reproductive: Prostate is unremarkable. Other: Obesity. No ascites. Bilateral pelvic phleboliths. No ascites. Musculoskeletal: Small paraumbilical hernia containing only mesenteric fat. Multilevel spondylitic change in the thoracolumbar spine. No displaced fracture or worrisome bone lesion. IMPRESSION: 1. No definite active extravasation, sensitivity decreased in the absence of delayed scans. 2. Persistent short-segment wall thickening in the proximal sigmoid colon without obstruction. Follow-up colonoscopy or BE may be useful to exclude mucosal lesion. 3. Descending colon diverticulosis. 4. Small paraumbilical hernia containing only mesenteric fat. 5. Stable left adrenal adenoma. Aortic Atherosclerosis (ICD10-I70.0). Electronically Signed   By: Corlis Leak M.D.   On: 07/09/2020 14:16      Subjective: Few BMx mixed with blood overnight, brown BM this mornign.  No dizziness, confusion.  Discharge Exam: Vitals:   07/10/20 1452 07/10/20 1620  BP: (!) 146/75 (!)  157/70  Pulse: 91 82  Resp: 16 17  Temp: 98.3 F (36.8 C) 97.8 F (36.6 C)  SpO2: 98% 99%   Vitals:   07/10/20 1350 07/10/20 1431 07/10/20 1452 07/10/20 1620  BP: (!) 160/75 (!) 159/73 (!) 146/75 (!) 157/70  Pulse: (!) 101 89 91 82  Resp: 20 20 16 17   Temp: 98.2 F (36.8 C) 98.1 F (36.7 C) 98.3 F (36.8 C) 97.8 F (36.6 C)  TempSrc: Temporal     SpO2:  98% 98% 99%  Weight:      Height:        General: Pt is alert, awake, not in acute distress Cardiovascular: RRR, nl S1-S2, no murmurs appreciated.   No LE edema.   Respiratory: Normal respiratory rate and rhythm.  CTAB without rales or wheezes. Abdominal: Abdomen soft and non-tender.  No distension or HSM.   Neuro/Psych: Strength symmetric in upper and lower extremities.  Judgment and insight appear normal.   The results of significant diagnostics from this hospitalization (including imaging, microbiology, ancillary and laboratory) are listed below for reference.     Microbiology: Recent Results (from the past 240 hour(s))  Resp Panel  by RT-PCR (Flu A&B, Covid) Nasopharyngeal Swab     Status: None   Collection Time: 07/09/20  2:30 PM   Specimen: Nasopharyngeal Swab; Nasopharyngeal(NP) swabs in vial transport medium  Result Value Ref Range Status   SARS Coronavirus 2 by RT PCR NEGATIVE NEGATIVE Final    Comment: (NOTE) SARS-CoV-2 target nucleic acids are NOT DETECTED.  The SARS-CoV-2 RNA is generally detectable in upper respiratory specimens during the acute phase of infection. The lowest concentration of SARS-CoV-2 viral copies this assay can detect is 138 copies/mL. A negative result does not preclude SARS-Cov-2 infection and should not be used as the sole basis for treatment or other patient management decisions. A negative result may occur with  improper specimen collection/handling, submission of specimen other than nasopharyngeal swab, presence of viral mutation(s) within the areas targeted by this assay, and  inadequate number of viral copies(<138 copies/mL). A negative result must be combined with clinical observations, patient history, and epidemiological information. The expected result is Negative.  Fact Sheet for Patients:  BloggerCourse.com  Fact Sheet for Healthcare Providers:  SeriousBroker.it  This test is no t yet approved or cleared by the [place] FDA and  has been authorized for detection and/or diagnosis of SARS-CoV-2 by FDA under an Emergency Use Authorization (EUA). This EUA will remain  in effect (meaning this test can be used) for the duration of the COVID-19 declaration under Section 564(b)(1) of the Act, 21 U.S.C.section 360bbb-3(b)(1), unless the authorization is terminated  or revoked sooner.       Influenza A by PCR NEGATIVE NEGATIVE Final   Influenza B by PCR NEGATIVE NEGATIVE Final    Comment: (NOTE) The Xpert Xpress SARS-CoV-2/FLU/RSV plus assay is intended as an aid in the diagnosis of influenza from Nasopharyngeal swab specimens and should not be used as a sole basis for treatment. Nasal washings and aspirates are unacceptable for Xpert Xpress SARS-CoV-2/FLU/RSV testing.  Fact Sheet for Patients: BloggerCourse.com  Fact Sheet for Healthcare Providers: SeriousBroker.it  This test is not yet approved or cleared by the [place] FDA and has been authorized for detection and/or diagnosis of SARS-CoV-2 by FDA under an Emergency Use Authorization (EUA). This EUA will remain in effect (meaning this test can be used) for the duration of the COVID-19 declaration under Section 564(b)(1) of the Act, 21 U.S.C. section 360bbb-3(b)(1), unless the authorization is terminated or revoked.  Performed at [facility], [address] [place], [place] [address]      Labs: BNP (last 3 results) No results for input(s): BNP in the last 8760  hours. Basic Metabolic Panel: Recent Labs  Lab 07/09/20 1151 07/10/20 0452  NA 143 145  K 3.6 3.5  CL 103 110  CO2 32 27  GLUCOSE 98 103*  BUN 17 14  CREATININE 1.11 0.85  CALCIUM 9.4 9.2  MG  --  2.0   Liver Function Tests: Recent Labs  Lab 07/09/20 1151 07/10/20 0452  AST 36 45*  ALT 33 49*  ALKPHOS 55 49  BILITOT 0.8 1.1  PROT 7.3 6.5  ALBUMIN 3.8 3.5   No results for input(s): LIPASE, AMYLASE in the last 168 hours. No results for input(s): AMMONIA in the last 168 hours. CBC: Recent Labs  Lab 07/09/20 1151 07/09/20 2100 07/10/20 0452  WBC 6.4 6.3 7.0  HGB 13.7 13.7 12.1*  HCT 42.5 42.1 37.2*  MCV 95.1 94.4 93.5  PLT 123* 125* 123*   Cardiac Enzymes: No results for input(s): CKTOTAL, CKMB, CKMBINDEX, TROPONINI in  the last 168 hours. BNP: Invalid input(s): POCBNP CBG: No results for input(s): GLUCAP in the last 168 hours. D-Dimer No results for input(s): DDIMER in the last 72 hours. Hgb A1c No results for input(s): HGBA1C in the last 72 hours. Lipid Profile No results for input(s): CHOL, HDL, LDLCALC, TRIG, CHOLHDL, LDLDIRECT in the last 72 hours. Thyroid function studies No results for input(s): TSH, T4TOTAL, T3FREE, THYROIDAB in the last 72 hours.  Invalid input(s): FREET3 Anemia work up No results for input(s): VITAMINB12, FOLATE, FERRITIN, TIBC, IRON, RETICCTPCT in the last 72 hours. Urinalysis    Component Value Date/Time   COLORURINE YELLOW (A) 12/05/2018 1222   APPEARANCEUR CLEAR (A) 12/05/2018 1222   LABSPEC 1.031 (H) 12/05/2018 1222   PHURINE 7.0 12/05/2018 1222   GLUCOSEU NEGATIVE 12/05/2018 1222   HGBUR NEGATIVE 12/05/2018 1222   BILIRUBINUR NEGATIVE 12/05/2018 1222   KETONESUR NEGATIVE 12/05/2018 1222   PROTEINUR NEGATIVE 12/05/2018 1222   NITRITE NEGATIVE 12/05/2018 1222   LEUKOCYTESUR NEGATIVE 12/05/2018 1222   Sepsis Labs Invalid input(s): PROCALCITONIN,  WBC,  LACTICIDVEN Microbiology Recent Results (from the past 240  hour(s))  Resp Panel by RT-PCR (Flu A&B, Covid) Nasopharyngeal Swab     Status: None   Collection Time: 07/09/20  2:30 PM   Specimen: Nasopharyngeal Swab; Nasopharyngeal(NP) swabs in vial transport medium  Result Value Ref Range Status   SARS Coronavirus 2 by RT PCR NEGATIVE NEGATIVE Final    Comment: (NOTE) SARS-CoV-2 target nucleic acids are NOT DETECTED.  The SARS-CoV-2 RNA is generally detectable in upper respiratory specimens during the acute phase of infection. The lowest concentration of SARS-CoV-2 viral copies this assay can detect is 138 copies/mL. A negative result does not preclude SARS-Cov-2 infection and should not be used as the sole basis for treatment or other patient management decisions. A negative result may occur with  improper specimen collection/handling, submission of specimen other than nasopharyngeal swab, presence of viral mutation(s) within the areas targeted by this assay, and inadequate number of viral copies(<138 copies/mL). A negative result must be combined with clinical observations, patient history, and epidemiological information. The expected result is Negative.  Fact Sheet for Patients:  BloggerCourse.com  Fact Sheet for Healthcare Providers:  SeriousBroker.it  This test is no t yet approved or cleared by the [place] FDA and  has been authorized for detection and/or diagnosis of SARS-CoV-2 by FDA under an Emergency Use Authorization (EUA). This EUA will remain  in effect (meaning this test can be used) for the duration of the COVID-19 declaration under Section 564(b)(1) of the Act, 21 U.S.C.section 360bbb-3(b)(1), unless the authorization is terminated  or revoked sooner.       Influenza A by PCR NEGATIVE NEGATIVE Final   Influenza B by PCR NEGATIVE NEGATIVE Final    Comment: (NOTE) The Xpert Xpress SARS-CoV-2/FLU/RSV plus assay is intended as an aid in the diagnosis of influenza from  Nasopharyngeal swab specimens and should not be used as a sole basis for treatment. Nasal washings and aspirates are unacceptable for Xpert Xpress SARS-CoV-2/FLU/RSV testing.  Fact Sheet for Patients: BloggerCourse.com  Fact Sheet for Healthcare Providers: SeriousBroker.it  This test is not yet approved or cleared by the [place] FDA and has been authorized for detection and/or diagnosis of SARS-CoV-2 by FDA under an Emergency Use Authorization (EUA). This EUA will remain in effect (meaning this test can be used) for the duration of the COVID-19 declaration under Section 564(b)(1) of the Act, 21 U.S.C. section 360bbb-3(b)(1), unless the authorization  is terminated or revoked.  Performed at [facility], [address], [place], [place] [address]      Time coordinating discharge: 25 minutes      SIGNED:   Alberteen Sam, MD  [facility] 07/10/2020, 4:59 PM    Comment: note initially erroneously filed as progress note.

## 2020-07-10 NOTE — Transfer of Care (Signed)
Immediate Anesthesia Transfer of Care Note  Patient: Jay Sandoval  Procedure(s) Performed: COLONOSCOPY (N/A )  Patient Location: Endoscopy Unit  Anesthesia Type:General  Level of Consciousness: awake and alert   Airway & Oxygen Therapy: Patient Spontanous Breathing and Patient connected to face mask oxygen  Post-op Assessment: Report given to RN and Post -op Vital signs reviewed and stable  Post vital signs: Reviewed  Last Vitals:  Vitals Value Taken Time  BP 160/75 07/10/20 1350  Temp    Pulse 101 07/10/20 1351  Resp 19 07/10/20 1351  SpO2 100 % 07/10/20 1351  Vitals shown include unvalidated device data.  Last Pain:  Vitals:   07/10/20 1134  TempSrc: Temporal  PainSc:          Complications: No complications documented.

## 2020-07-10 NOTE — Progress Notes (Signed)
NP aware of patient's positive fecal occult blood test.

## 2020-07-10 NOTE — Progress Notes (Signed)
Will give daily meds after colonoscopy per request of endo nurse.

## 2020-07-10 NOTE — Progress Notes (Deleted)
Physician Discharge Summary  Jay Sandoval PTY:[id number] DOB: [birth year] DOA: 07/09/2020  PCP: Barbette Reichmann, MD  Admit date: 07/09/2020 Discharge date: 07/10/2020  Admitted From: Home  Disposition:  Home   Recommendations for Outpatient Follow-up:  1. Follow up with PCP Dr. Marcello Fennel in 1 week 2. Dr. Marcello Fennel: Please check LFTs, CBC in 1 week to follow up transaminases and Hgb 3. Follow up with GI Dr. Maximino Greenland in 4-6 weeks      Home Health: None new  Equipment/Devices: None new  Discharge Condition: Fair  CODE STATUS: FULL Diet recommendation: Regular  Brief/Interim Summary:   Jay Sandoval is a 80 y.o. M with hypothyroidism, HTN, gout, COPD on 3L home O2, obesity, OSA on CPAP who prsented with hematochezia.  Patient awoke morning of amidssion and had single episode bright red bloody stool. No prior GIB.  Mobic on med list, patient unsure of NSAID meaning.  In the ER, Hgb stable.  GI recommended colonoscopy.      PRINCIPAL HOSPITAL DIAGNOSIS: Acute diverticular bleed    Discharge Diagnoses:   Acute diverticular bleed Patient admitted and had insignificant drop in Hgb from 13->12 ovenright.    Had blood mixed with BM overnight, brown BM in morning.  Underwent colonoscopy that showed diverticuli, some old blood, and no active bleeding.       Elevated transaminases Mild.  Follow up with PCP to document resolution/normalization.   Hypothyroidism Continue levothyroxine  Gout Continue allopurinol  COPD with chronic hypoxic respiratory failure OSA Continue Spiriva.  Continue CPAP at night  Morbid obesity BMI >60  Hypertension Continue amlodipine, atenolol, benazepril  Depression Continue duloxetine                    Discharge Instructions  Discharge Instructions    Diet - low sodium heart healthy   Complete by: As directed    Discharge instructions   Complete by: As directed    From Dr. Maryfrances Bunnell: You were evaluated for rectal  bleeding This turned out to be from a diverticular bleed.   "Diverticuli" are small outpouchings in the colon, that are found naturally in many people.  Sometimes they bleed, although this is usually something that stops by itself.  Thankfully, nothing more serious was found on your colonoscopy.  You will likely have some small amounts of blood that still pass over the next few days.  If you have severe and INCREASING RED blood over the next few days, you should call your doctor  If you have worsening bleeding and feel dizzy or lightheaded you should come back to the ER.   Go see Dr. Maximino Greenland in 1 month  Go see your doctor in 1 week and have them check your labs  It is probably best NOT to take meloxicam/Mobic, because this has been associated with bleeding   Increase activity slowly   Complete by: As directed      Allergies as of 07/10/2020   No Known Allergies     Medication List    STOP taking these medications   meloxicam 15 MG tablet Commonly known as: MOBIC     TAKE these medications   albuterol 108 (90 Base) MCG/ACT inhaler Commonly known as: VENTOLIN HFA Inhale 2 puffs into the lungs every 6 (six) hours as needed for wheezing or shortness of breath.   albuterol (2.5 MG/3ML) 0.083% nebulizer solution Commonly known as: PROVENTIL Take 2.5 mg by nebulization every 6 (six) hours as needed.   allopurinol 100 MG tablet Commonly  known as: ZYLOPRIM Take 200 mg by mouth daily.   amLODipine-atorvastatin 5-10 MG tablet Commonly known as: CADUET Take 1 tablet by mouth daily.   ascorbic acid 500 MG tablet Commonly known as: VITAMIN C Take 500 mg by mouth daily.   aspirin EC 81 MG tablet Take 81 mg by mouth daily. Swallow whole.   atenolol 50 MG tablet Commonly known as: TENORMIN Take 50 mg by mouth daily.   benazepril 20 MG tablet Commonly known as: LOTENSIN Take 20 mg by mouth daily.   clotrimazole-betamethasone cream Commonly known as: LOTRISONE Apply 1  application topically 2 (two) times daily.   Coenzyme Q10 100 MG capsule Take 100 mg by mouth 2 (two) times daily.   DULoxetine 60 MG capsule Commonly known as: CYMBALTA Take 60 mg by mouth daily.   fluticasone 50 MCG/ACT nasal spray Commonly known as: FLONASE Place 2 sprays into both nostrils daily as needed.   levothyroxine 25 MCG tablet Commonly known as: SYNTHROID TAKE 1 TABLET BY MOUTH ONCE DAILY ON AN EMPTY STOMACH WITH A GLASS OF WATER AT LEAST 30 60 MINUTES BEFORE BREAKFAST   metolazone 2.5 MG tablet Commonly known as: ZAROXOLYN Take 2.5 mg by mouth every 7 (seven) days.   MULTIVITAL PO Take 1 tablet by mouth daily.   nystatin cream Commonly known as: MYCOSTATIN Apply 1 application topically daily as needed.   omega-3 fish oil 1000 MG Caps capsule Commonly known as: MAXEPA Take 2 capsules by mouth 2 (two) times daily.   omeprazole 20 MG capsule Commonly known as: PRILOSEC Take 20 mg by mouth daily.   potassium chloride SA 20 MEQ tablet Commonly known as: KLOR-CON Take 20 mEq by mouth daily.   pregabalin 75 MG capsule Commonly known as: LYRICA Take 75 mg by mouth 2 (two) times daily.   pyridOXINE 100 MG tablet Commonly known as: VITAMIN B-6 Take 100 mg by mouth daily.   Restasis 0.05 % ophthalmic emulsion Generic drug: cycloSPORINE Place 1 drop into both eyes 2 (two) times daily.   senna-docusate 8.6-50 MG tablet Commonly known as: Senokot-S Take 2 tablets by mouth at bedtime.   tiotropium 18 MCG inhalation capsule Commonly known as: SPIRIVA Place 18 mcg into inhaler and inhale daily.   tiZANidine 4 MG tablet Commonly known as: ZANAFLEX Take 4 mg by mouth 2 (two) times daily as needed.   torsemide 20 MG tablet Commonly known as: DEMADEX Take 20 mg by mouth daily.       Follow-up Information    Barbette Reichmann, MD. Schedule an appointment as soon as possible for a visit in 1 week(s).   Specialty: Internal Medicine Contact  information: [address] [place] [address] [phone]        Pasty Spillers, MD. Schedule an appointment as soon as possible for a visit in 1 month(s).   Specialty: Gastroenterology Contact information: [address] [place] [address] [phone]              No Known Allergies  Consultations:  Gastroenterology   Procedures/Studies: CT Angio Abd/Pel W and/or Wo Contrast  Result Date: 07/09/2020 CLINICAL DATA:  Rectal bleeding EXAM: CTA ABDOMEN AND PELVIS WITHOUT AND WITH CONTRAST TECHNIQUE: Multidetector CT imaging of the abdomen and pelvis was performed using the standard protocol during bolus administration of intravenous contrast. Multiplanar reconstructed images and MIPs were obtained and reviewed to evaluate the vascular anatomy. CONTRAST:  OMNIPAQUE IOHEXOL 350 MG/ML SOLN COMPARISON:  12/05/2018 FINDINGS: VASCULAR Aorta: Mild  calcified atheromatous plaque. No aneurysm, dissection, or stenosis. Celiac: Patent without evidence of aneurysm, dissection, vasculitis or significant stenosis. SMA: Patent without evidence of aneurysm, dissection, vasculitis or significant stenosis. Renals: Both renal arteries are patent without evidence of aneurysm, dissection, vasculitis, fibromuscular dysplasia or significant stenosis. IMA: Diminutive, appears patent proximally. Inflow: Mild tortuosity.  No aneurysm, dissection, or stenosis. Proximal Outflow: Bilateral common femoral and visualized portions of the superficial and profunda femoral arteries are patent without evidence of aneurysm, dissection, vasculitis or significant stenosis. Veins: No obvious venous abnormality within the limitations of this arterial phase study. Portal vein patent. Review of the MIP images confirms the above findings. NON-VASCULAR Lower chest: 6 no pleural or pericardial effusion. Linear scarring or subsegmental atelectasis posteriorly at both lung bases. Hepatobiliary: No focal  liver lesion. Gallbladder not visualized. Small amount of gas in centrally in the decompressed biliary tree suggesting previous sphincterotomy. Pancreas: Unremarkable. No pancreatic ductal dilatation or surrounding inflammatory changes. Spleen: Normal in size without focal abnormality. Adrenals/Urinary Tract: 2.2 cm low-attenuation left adrenal nodule; a 2 cm lesion was described on prior CT 04/16/2009 suggesting this represents a benign adenoma. No urolithiasis or hydronephrosis. Symmetric renal parenchymal enhancement. Urinary bladder incompletely distended. Stomach/Bowel: Stomach is decompressed. Small bowel nondilated. Normal appendix. The colon is nondilated. Scattered descending segment diverticula. Short-segment of persistent wall thickening in the proximal sigmoid colon without evidence of obstruction. No definite active extravasation, sensitivity decreased in the absence of delayed scans. Lymphatic: No abdominal or pelvic adenopathy localized. Reproductive: Prostate is unremarkable. Other: Obesity. No ascites. Bilateral pelvic phleboliths. No ascites. Musculoskeletal: Small paraumbilical hernia containing only mesenteric fat. Multilevel spondylitic change in the thoracolumbar spine. No displaced fracture or worrisome bone lesion. IMPRESSION: 1. No definite active extravasation, sensitivity decreased in the absence of delayed scans. 2. Persistent short-segment wall thickening in the proximal sigmoid colon without obstruction. Follow-up colonoscopy or BE may be useful to exclude mucosal lesion. 3. Descending colon diverticulosis. 4. Small paraumbilical hernia containing only mesenteric fat. 5. Stable left adrenal adenoma. Aortic Atherosclerosis (ICD10-I70.0). Electronically Signed   By: Corlis Leak M.D.   On: 07/09/2020 14:16       Subjective: Few BMx mixed with blood overnight, brown BM this mornign.  No dizziness, confusion.  Discharge Exam: Vitals:   07/10/20 1452 07/10/20 1620  BP: (!) 146/75  (!) 157/70  Pulse: 91 82  Resp: 16 17  Temp: 98.3 F (36.8 C) 97.8 F (36.6 C)  SpO2: 98% 99%   Vitals:   07/10/20 1350 07/10/20 1431 07/10/20 1452 07/10/20 1620  BP: (!) 160/75 (!) 159/73 (!) 146/75 (!) 157/70  Pulse: (!) 101 89 91 82  Resp: 20 20 16 17   Temp: 98.2 F (36.8 C) 98.1 F (36.7 C) 98.3 F (36.8 C) 97.8 F (36.6 C)  TempSrc: Temporal     SpO2:  98% 98% 99%  Weight:      Height:        General: Pt is alert, awake, not in acute distress Cardiovascular: RRR, nl S1-S2, no murmurs appreciated.   No LE edema.   Respiratory: Normal respiratory rate and rhythm.  CTAB without rales or wheezes. Abdominal: Abdomen soft and non-tender.  No distension or HSM.   Neuro/Psych: Strength symmetric in upper and lower extremities.  Judgment and insight appear normal.   The results of significant diagnostics from this hospitalization (including imaging, microbiology, ancillary and laboratory) are listed below for reference.     Microbiology: Recent Results (from the past 240 hour(s))  Resp  Panel by RT-PCR (Flu A&B, Covid) Nasopharyngeal Swab     Status: None   Collection Time: 07/09/20  2:30 PM   Specimen: Nasopharyngeal Swab; Nasopharyngeal(NP) swabs in vial transport medium  Result Value Ref Range Status   SARS Coronavirus 2 by RT PCR NEGATIVE NEGATIVE Final    Comment: (NOTE) SARS-CoV-2 target nucleic acids are NOT DETECTED.  The SARS-CoV-2 RNA is generally detectable in upper respiratory specimens during the acute phase of infection. The lowest concentration of SARS-CoV-2 viral copies this assay can detect is 138 copies/mL. A negative result does not preclude SARS-Cov-2 infection and should not be used as the sole basis for treatment or other patient management decisions. A negative result may occur with  improper specimen collection/handling, submission of specimen other than nasopharyngeal swab, presence of viral mutation(s) within the areas targeted by this assay,  and inadequate number of viral copies(<138 copies/mL). A negative result must be combined with clinical observations, patient history, and epidemiological information. The expected result is Negative.  Fact Sheet for Patients:  BloggerCourse.com  Fact Sheet for Healthcare Providers:  SeriousBroker.it  This test is no t yet approved or cleared by the [place] FDA and  has been authorized for detection and/or diagnosis of SARS-CoV-2 by FDA under an Emergency Use Authorization (EUA). This EUA will remain  in effect (meaning this test can be used) for the duration of the COVID-19 declaration under Section 564(b)(1) of the Act, 21 U.S.C.section 360bbb-3(b)(1), unless the authorization is terminated  or revoked sooner.       Influenza A by PCR NEGATIVE NEGATIVE Final   Influenza B by PCR NEGATIVE NEGATIVE Final    Comment: (NOTE) The Xpert Xpress SARS-CoV-2/FLU/RSV plus assay is intended as an aid in the diagnosis of influenza from Nasopharyngeal swab specimens and should not be used as a sole basis for treatment. Nasal washings and aspirates are unacceptable for Xpert Xpress SARS-CoV-2/FLU/RSV testing.  Fact Sheet for Patients: BloggerCourse.com  Fact Sheet for Healthcare Providers: SeriousBroker.it  This test is not yet approved or cleared by the [place] FDA and has been authorized for detection and/or diagnosis of SARS-CoV-2 by FDA under an Emergency Use Authorization (EUA). This EUA will remain in effect (meaning this test can be used) for the duration of the COVID-19 declaration under Section 564(b)(1) of the Act, 21 U.S.C. section 360bbb-3(b)(1), unless the authorization is terminated or revoked.  Performed at [facility], [address] [place], [place] [address]      Labs: BNP (last 3 results) No results for input(s): BNP in the last 8760  hours. Basic Metabolic Panel: Recent Labs  Lab 07/09/20 1151 07/10/20 0452  NA 143 145  K 3.6 3.5  CL 103 110  CO2 32 27  GLUCOSE 98 103*  BUN 17 14  CREATININE 1.11 0.85  CALCIUM 9.4 9.2  MG  --  2.0   Liver Function Tests: Recent Labs  Lab 07/09/20 1151 07/10/20 0452  AST 36 45*  ALT 33 49*  ALKPHOS 55 49  BILITOT 0.8 1.1  PROT 7.3 6.5  ALBUMIN 3.8 3.5   No results for input(s): LIPASE, AMYLASE in the last 168 hours. No results for input(s): AMMONIA in the last 168 hours. CBC: Recent Labs  Lab 07/09/20 1151 07/09/20 2100 07/10/20 0452  WBC 6.4 6.3 7.0  HGB 13.7 13.7 12.1*  HCT 42.5 42.1 37.2*  MCV 95.1 94.4 93.5  PLT 123* 125* 123*   Cardiac Enzymes: No results for input(s): CKTOTAL, CKMB, CKMBINDEX, TROPONINI  in the last 168 hours. BNP: Invalid input(s): POCBNP CBG: No results for input(s): GLUCAP in the last 168 hours. D-Dimer No results for input(s): DDIMER in the last 72 hours. Hgb A1c No results for input(s): HGBA1C in the last 72 hours. Lipid Profile No results for input(s): CHOL, HDL, LDLCALC, TRIG, CHOLHDL, LDLDIRECT in the last 72 hours. Thyroid function studies No results for input(s): TSH, T4TOTAL, T3FREE, THYROIDAB in the last 72 hours.  Invalid input(s): FREET3 Anemia work up No results for input(s): VITAMINB12, FOLATE, FERRITIN, TIBC, IRON, RETICCTPCT in the last 72 hours. Urinalysis    Component Value Date/Time   COLORURINE YELLOW (A) 12/05/2018 1222   APPEARANCEUR CLEAR (A) 12/05/2018 1222   LABSPEC 1.031 (H) 12/05/2018 1222   PHURINE 7.0 12/05/2018 1222   GLUCOSEU NEGATIVE 12/05/2018 1222   HGBUR NEGATIVE 12/05/2018 1222   BILIRUBINUR NEGATIVE 12/05/2018 1222   KETONESUR NEGATIVE 12/05/2018 1222   PROTEINUR NEGATIVE 12/05/2018 1222   NITRITE NEGATIVE 12/05/2018 1222   LEUKOCYTESUR NEGATIVE 12/05/2018 1222   Sepsis Labs Invalid input(s): PROCALCITONIN,  WBC,  LACTICIDVEN Microbiology Recent Results (from the past 240  hour(s))  Resp Panel by RT-PCR (Flu A&B, Covid) Nasopharyngeal Swab     Status: None   Collection Time: 07/09/20  2:30 PM   Specimen: Nasopharyngeal Swab; Nasopharyngeal(NP) swabs in vial transport medium  Result Value Ref Range Status   SARS Coronavirus 2 by RT PCR NEGATIVE NEGATIVE Final    Comment: (NOTE) SARS-CoV-2 target nucleic acids are NOT DETECTED.  The SARS-CoV-2 RNA is generally detectable in upper respiratory specimens during the acute phase of infection. The lowest concentration of SARS-CoV-2 viral copies this assay can detect is 138 copies/mL. A negative result does not preclude SARS-Cov-2 infection and should not be used as the sole basis for treatment or other patient management decisions. A negative result may occur with  improper specimen collection/handling, submission of specimen other than nasopharyngeal swab, presence of viral mutation(s) within the areas targeted by this assay, and inadequate number of viral copies(<138 copies/mL). A negative result must be combined with clinical observations, patient history, and epidemiological information. The expected result is Negative.  Fact Sheet for Patients:  BloggerCourse.com  Fact Sheet for Healthcare Providers:  SeriousBroker.it  This test is no t yet approved or cleared by the [place] FDA and  has been authorized for detection and/or diagnosis of SARS-CoV-2 by FDA under an Emergency Use Authorization (EUA). This EUA will remain  in effect (meaning this test can be used) for the duration of the COVID-19 declaration under Section 564(b)(1) of the Act, 21 U.S.C.section 360bbb-3(b)(1), unless the authorization is terminated  or revoked sooner.       Influenza A by PCR NEGATIVE NEGATIVE Final   Influenza B by PCR NEGATIVE NEGATIVE Final    Comment: (NOTE) The Xpert Xpress SARS-CoV-2/FLU/RSV plus assay is intended as an aid in the diagnosis of influenza from  Nasopharyngeal swab specimens and should not be used as a sole basis for treatment. Nasal washings and aspirates are unacceptable for Xpert Xpress SARS-CoV-2/FLU/RSV testing.  Fact Sheet for Patients: BloggerCourse.com  Fact Sheet for Healthcare Providers: SeriousBroker.it  This test is not yet approved or cleared by the [place] FDA and has been authorized for detection and/or diagnosis of SARS-CoV-2 by FDA under an Emergency Use Authorization (EUA). This EUA will remain in effect (meaning this test can be used) for the duration of the COVID-19 declaration under Section 564(b)(1) of the Act, 21 U.S.C. section 360bbb-3(b)(1), unless the  authorization is terminated or revoked.  Performed at [facility], [address] [place], [place] [address]      Time coordinating discharge: 25 minutes      SIGNED:   Alberteen Sam, MD  [facility] 07/10/2020, 4:54 PM

## 2020-07-12 NOTE — Anesthesia Postprocedure Evaluation (Signed)
Anesthesia Post Note  Patient: Jay Sandoval  Procedure(s) Performed: COLONOSCOPY (N/A )  Patient location during evaluation: PACU Anesthesia Type: General Level of consciousness: awake and alert Pain management: pain level controlled Vital Signs Assessment: post-procedure vital signs reviewed and stable Respiratory status: spontaneous breathing, nonlabored ventilation and respiratory function stable Cardiovascular status: blood pressure returned to baseline and stable Postop Assessment: no apparent nausea or vomiting Anesthetic complications: no   No complications documented.   Last Vitals:  Vitals:   07/10/20 1452 07/10/20 1620  BP: (!) 146/75 (!) 157/70  Pulse: 91 82  Resp: 16 17  Temp: 36.8 C 36.6 C  SpO2: 98% 99%    Last Pain:  Vitals:   07/10/20 1410  TempSrc:   PainSc: 0-No pain                 Aurelio Brash Ramsdell

## 2020-07-14 LAB — SURGICAL PATHOLOGY

## 2020-07-15 NOTE — Congregational Nurse Program (Signed)
  Dept: [phone]   Congregational Nurse Program Note  Date of Encounter: 07/15/2020  BP 138/82 Client in for blood pressure check. Reports pain to right heel, advised to contact PCP. Client is waiting for a return call for an appointment with his PCP. Past Medical History: Past Medical History:  Diagnosis Date  . Arthritis   . Asthma   . Hypertension   . Osteoporosis     Encounter Details:  CNP Questionnaire - 07/15/20 1213      Questionnaire   Do you give verbal consent to treat you today? Yes    [facility] Setting [facility]   Location Patient Served At Not Applicable   [facility]   Patient Status Not Applicable    Medical Provider Yes    Insurance Medicaid;Medicare    Intervention Assess (including screenings)    Housing/Utilities --   n/a   Transportation Need transportation assistance   uses transportation through [employer] --   did report any personal safety issues   Food --   none reported   Medication --   none at this time   Referrals --   none needed   Screening Referrals --   none needed   ED Visit Averted --   n/a   Life-Saving Intervention Made --   n/a

## 2020-07-29 NOTE — Congregational Nurse Program (Signed)
  Dept: [phone]   Congregational Nurse Program Note  Date of Encounter: 07/29/2020 Client in to bring medication list and AVS from last hospitalization. BP checked: 136/82 Past Medical History: Past Medical History:  Diagnosis Date  . Arthritis   . Asthma   . Hypertension   . Osteoporosis     Encounter Details:  CNP Questionnaire - 07/29/20 1340      Questionnaire   Do you give verbal consent to treat you today? Yes    Visit Setting Other   [facility]   Location [facility] Served At Not Applicable   [facility]   Patient Status Not Applicable    Medical Provider Yes    Insurance Medicaid;Medicare    Intervention Assess (including screenings)    Housing/Utilities --   n/a   Transportation Need transportation assistance   uses transportation through [employer] --   did report any personal safety issues   Food --   none reported   Medication --   none at this time   Referrals --   none needed   Screening Referrals --   none needed   ED Visit Averted --   n/a   Life-Saving Intervention Made --   n/a

## 2020-08-05 NOTE — Congregational Nurse Program (Signed)
  Dept: [phone]   Congregational Nurse Program Note  Date of Encounter: 08/05/2020 Client to clinic for weekly blood pressure check. No new concerns.has an upcoming GI appointment on 4/18. Encouraged client to return to clinic next next week to discuss md appointment. Past Medical History: Past Medical History:  Diagnosis Date  . Arthritis   . Asthma   . Hypertension   . Osteoporosis     Encounter Details:  CNP Questionnaire - 08/05/20 1229      Questionnaire   Do you give verbal consent to treat you today? Yes    Visit Setting Other   [facility]   Location [facility] Served At Not Applicable   [facility]   Patient Status Not Applicable    Medical Provider Yes    Insurance Medicaid;Medicare    Intervention Assess (including screenings)    Housing/Utilities --   n/a   Transportation Need transportation assistance   uses transportation through [employer] --   did report any personal safety issues   Food --   none reported   Medication --   none at this time   Referrals --   none needed   Screening Referrals --   none needed   ED Visit Averted --   n/a   Life-Saving Intervention Made --   n/a

## 2020-08-10 ENCOUNTER — Ambulatory Visit (INDEPENDENT_AMBULATORY_CARE_PROVIDER_SITE_OTHER): Payer: Medicare Other | Admitting: Gastroenterology

## 2020-08-10 ENCOUNTER — Encounter: Payer: Self-pay | Admitting: Gastroenterology

## 2020-08-10 ENCOUNTER — Other Ambulatory Visit: Payer: Self-pay

## 2020-08-10 VITALS — BP 109/67 | HR 66 | Temp 98.1°F | Wt 383.0 lb

## 2020-08-10 DIAGNOSIS — D508 Other iron deficiency anemias: Secondary | ICD-10-CM

## 2020-08-10 DIAGNOSIS — K625 Hemorrhage of anus and rectum: Secondary | ICD-10-CM

## 2020-08-10 NOTE — Progress Notes (Signed)
Melodie Bouillon, MD [address]  [address]  [place], [place] [address]  Main: [phone]  Fax: [phone]   Primary Care Physician: Barbette Reichmann, MD   Chief Complaint  Patient presents with  . Hospitalization Follow-up    HPI: Jay Sandoval is a 80 y.o. male who presents for hospital follow-up for diverticular has not had any episodes of bleeding since being home.  Denies any abdominal pain, nausea or vomiting.  No weight loss.  Colonoscopy showed evidence of old blood in the sigmoid colon.  1 small colon polyp was seen and removed and showed benign inflammatory polyp.  Patient has been taking MiraLAX at home daily and reports symptoms resolved movements present daily.  I was able to access today and personally reviewed his previous colonoscopy records under provation  Patient had a colonoscopy in 2014 with Dr. Marva Panda for screening and showed diverticulosis and no polyps were reported  Prior to this, he had 2 colonoscopies in 2004 and 2005.  2004 sigmoid colon erythema was seen and biopsied.  Pathology report not available.  2005 showed diverticulosis.  Patient denies any family history of colon cancer  Current Outpatient Medications  Medication Sig Dispense Refill  . albuterol (PROVENTIL) (2.5 MG/3ML) 0.083% nebulizer solution Take 2.5 mg by nebulization every 6 (six) hours as needed.    Marland Kitchen albuterol (VENTOLIN HFA) 108 (90 Base) MCG/ACT inhaler Inhale 2 puffs into the lungs every 6 (six) hours as needed for wheezing or shortness of breath.    . allopurinol (ZYLOPRIM) 100 MG tablet Take 200 mg by mouth daily.    Marland Kitchen amLODipine-atorvastatin (CADUET) 5-10 MG tablet Take 1 tablet by mouth daily.    Marland Kitchen ascorbic acid (VITAMIN C) 500 MG tablet Take 500 mg by mouth daily.    Marland Kitchen aspirin EC 81 MG tablet Take 81 mg by mouth daily. Swallow whole.    Marland Kitchen atenolol (TENORMIN) 50 MG tablet Take 50 mg by mouth daily.    . benazepril (LOTENSIN) 20 MG tablet Take 20 mg by mouth  daily.    . clotrimazole-betamethasone (LOTRISONE) cream Apply 1 application topically 2 (two) times daily.    . Coenzyme Q10 100 MG capsule Take 100 mg by mouth 2 (two) times daily.    . DULoxetine (CYMBALTA) 60 MG capsule Take 60 mg by mouth daily.    . fluticasone (FLONASE) 50 MCG/ACT nasal spray Place 2 sprays into both nostrils daily as needed.    Marland Kitchen levothyroxine (SYNTHROID) 25 MCG tablet TAKE 1 TABLET BY MOUTH ONCE DAILY ON AN EMPTY STOMACH WITH A GLASS OF WATER AT LEAST 30 60 MINUTES BEFORE BREAKFAST    . metolazone (ZAROXOLYN) 2.5 MG tablet Take 2.5 mg by mouth every 7 (seven) days.    . Multiple Vitamins-Minerals (MULTIVITAL PO) Take 1 tablet by mouth daily.    Marland Kitchen nystatin cream (MYCOSTATIN) Apply 1 application topically daily as needed.    Marland Kitchen omega-3 fish oil (MAXEPA) 1000 MG CAPS capsule Take 2 capsules by mouth 2 (two) times daily.    Marland Kitchen omeprazole (PRILOSEC) 20 MG capsule Take 20 mg by mouth daily.    . potassium chloride SA (K-DUR) 20 MEQ tablet Take 20 mEq by mouth daily.    . pregabalin (LYRICA) 75 MG capsule Take 75 mg by mouth 2 (two) times daily.    Marland Kitchen pyridOXINE (VITAMIN B-6) 100 MG tablet Take 100 mg by mouth daily.    . RESTASIS 0.05 % ophthalmic emulsion Place 1 drop into both eyes  2 (two) times daily.    Marland Kitchen senna-docusate (SENOKOT-S) 8.6-50 MG tablet Take 2 tablets by mouth at bedtime.    Marland Kitchen tiotropium (SPIRIVA) 18 MCG inhalation capsule Place 18 mcg into inhaler and inhale daily.    Marland Kitchen tiZANidine (ZANAFLEX) 4 MG tablet Take 4 mg by mouth 2 (two) times daily as needed.    . torsemide (DEMADEX) 20 MG tablet Take 20 mg by mouth daily.     No current facility-administered medications for this visit.    Allergies as of 08/10/2020  . (No Known Allergies)    ROS:  General: Negative for anorexia, weight loss, fever, chills, fatigue, weakness. ENT: Negative for hoarseness, difficulty swallowing , nasal congestion. CV: Negative for chest pain, angina, palpitations, dyspnea on  exertion, peripheral edema.  Respiratory: Negative for dyspnea at rest, dyspnea on exertion, cough, sputum, wheezing.  GI: See history of present illness. GU:  Negative for dysuria, hematuria, urinary incontinence, urinary frequency, nocturnal urination.  Endo: Negative for unusual weight change.    Physical Examination:   BP 109/67   Pulse 66   Temp 98.1 F (36.7 C) (Oral)   Wt (!) 383 lb (173.7 kg)   BMI 56.56 kg/m   General: Well-nourished, well-developed in no acute distress.  Eyes: No icterus. Conjunctivae pink. Mouth: Oropharyngeal mucosa moist and pink , no lesions erythema or exudate. Neck: Supple, Trachea midline Abdomen: Bowel sounds are normal, nontender, nondistended, no hepatosplenomegaly or masses, no abdominal bruits or hernia , no rebound or guarding.   Extremities: No lower extremity edema. No clubbing or deformities. Neuro: Alert and oriented x 3.  Grossly intact. Skin: Warm and dry, no jaundice.   Psych: Alert and cooperative, normal mood and affect.   Labs: CMP     Component Value Date/Time   NA 145 07/10/2020 0452   NA 143 09/07/2017 1434   K 3.5 07/10/2020 0452   CL 110 07/10/2020 0452   CO2 27 07/10/2020 0452   GLUCOSE 103 (H) 07/10/2020 0452   BUN 14 07/10/2020 0452   BUN 20 09/07/2017 1434   CREATININE 0.85 07/10/2020 0452   CALCIUM 9.2 07/10/2020 0452   PROT 6.5 07/10/2020 0452   PROT 7.2 09/07/2017 1434   ALBUMIN 3.5 07/10/2020 0452   ALBUMIN 4.5 09/07/2017 1434   AST 45 (H) 07/10/2020 0452   ALT 49 (H) 07/10/2020 0452   ALKPHOS 49 07/10/2020 0452   BILITOT 1.1 07/10/2020 0452   BILITOT 0.5 09/07/2017 1434   GFRNONAA >60 07/10/2020 0452   GFRAA >60 12/05/2018 1222   Lab Results  Component Value Date   WBC 7.0 07/10/2020   HGB 12.1 (L) 07/10/2020   HCT 37.2 (L) 07/10/2020   MCV 93.5 07/10/2020   PLT 123 (L) 07/10/2020    Imaging Studies: No results found.  Assessment and Plan:   Jay Sandoval is a 80 y.o. y/o male here for  follow-up of recent diverticular bleeding, which is resolved at this time, with no prior history of same  High-fiber diet MiraLAX daily with goal of 1-2 soft bowel movements daily.  If not at goal, patient instructed to increase dose to twice daily.  If loose stools with the medication, patient asked to decrease the medication to every other day, or half dose daily.  Patient verbalized understanding  Repeat hemoglobin at this time to ensure it has normalized  No indication for further screening colonoscopy given his age and no prior history of precancerous polyps on previous colonoscopies  Indication for screening colonoscopies  in general discussed in detail, and option of ongoing screening versus stopping screening colonoscopies discussed given that he is above 70 years of age discussed.  Patient prefers to stop Screening as well    Dr Melodie Bouillon

## 2020-08-11 LAB — HEMOGLOBIN: Hemoglobin: 14.2 g/dL (ref 13.0–17.7)

## 2020-09-23 NOTE — Congregational Nurse Program (Signed)
  Dept: [phone]   Congregational Nurse Program Note  Date of Encounter: 09/23/2020 Client to clinic for vital sign check and conversation. BP 148/70. PCP appointment on 5/25, benazepril increased to 40 mg daily form 20 mg. Client had not obtained a new prescription yet, discussed taking 2 of the 20 mg pills until they are gone, then fill his new dose rx. Client voiced understanding. Will continue to follow and provide support as needed.    Past Medical History: Past Medical History:  Diagnosis Date  . Arthritis   . Asthma   . Hypertension   . Osteoporosis     Encounter Details:  CNP Questionnaire - 09/23/20 1015      Questionnaire   Do you give verbal consent to treat you today? Yes    Visit Setting Other   [facility]   Location [facility]   Patient Status Not Applicable    Medical Provider Yes    Insurance Medicaid;Medicare    Intervention Assess (including screenings)    Housing/Utilities --   n/a   Transportation Need transportation assistance   uses transportation through [employer] --   did report any personal safety issues   Food --   none reported   Medication --   none at this time   Referrals --   none needed   Screening Referrals --   none needed   ED Visit Averted --   n/a   Life-Saving Intervention Made --   n/a

## 2020-11-12 DIAGNOSIS — R262 Difficulty in walking, not elsewhere classified: Secondary | ICD-10-CM | POA: Insufficient documentation

## 2021-11-23 ENCOUNTER — Encounter: Payer: Self-pay | Admitting: Student in an Organized Health Care Education/Training Program

## 2021-11-23 ENCOUNTER — Other Ambulatory Visit: Payer: Self-pay

## 2021-11-23 ENCOUNTER — Ambulatory Visit
Payer: Medicare Other | Attending: Student in an Organized Health Care Education/Training Program | Admitting: Student in an Organized Health Care Education/Training Program

## 2021-11-23 VITALS — BP 158/79 | HR 66 | Temp 97.4°F | Resp 16 | Ht 67.0 in | Wt 383.0 lb

## 2021-11-23 DIAGNOSIS — M170 Bilateral primary osteoarthritis of knee: Secondary | ICD-10-CM | POA: Diagnosis present

## 2021-11-23 DIAGNOSIS — G894 Chronic pain syndrome: Secondary | ICD-10-CM | POA: Insufficient documentation

## 2021-11-23 NOTE — Progress Notes (Signed)
Patient: Jay Sandoval  Service Category: E/M  Provider: Gillis Santa, MD  DOB: 05/15/1940  DOS: 11/23/2021  Referring Provider: Diamond Nickel, DO  MRN: [id number]  Setting: Ambulatory outpatient  PCP: Tracie Harrier, MD  Type: New Patient  Specialty: Interventional Pain Management    Location: Office  Delivery: Face-to-face     Primary Reason(s) for Visit: Encounter for initial evaluation of one or more chronic problems (new to examiner) potentially causing chronic pain, and posing a threat to normal musculoskeletal function. (Level of risk: High) CC: Knee Pain (Bilateral ), Shoulder Pain (Bilateral ), Hand Pain (Left ), and Back Pain (Lumbar, work injury December 29, 1987)  HPI  Mr. Jay Sandoval is a 81 y.o. year Jay Sandoval, male patient, who comes for the first time to our practice referred by Diamond Nickel, DO for our initial evaluation of his chronic pain. He has DDD (degenerative disc disease), lumbar; Obesity; Swelling of limb; Lymphedema; Asthma without status asthmaticus; Back muscle spasm; Chest pain with high risk for cardiac etiology; Chronic lower back pain; Congestive heart failure (HCC); COPD (chronic obstructive pulmonary disease) ([place]); GERD (gastroesophageal reflux disease); Hyperlipidemia, unspecified; Osteoarthritis of knees, bilateral; Preop cardiovascular exam; Psoriasis; Sleep apnea; SOB (shortness of breath) on exertion; Hypertension; Dependence on continuous positive airway pressure ventilation; Venous stasis; Asthma; Benign hypertension; Hypercholesterolemia; Obstructive sleep apnea syndrome; Chronic pain of both knees (Primary Area of Pain)(R>L); Chronic pain of both shoulders  (Secondary Area of Pain)(R>L); Chronic bilateral low back pain without sciatica [facility] Area of Pain)(R>L); Wrist pain, chronic, left (Fourth Area of Pain); Chronic hand pain, left; Chronic pain syndrome; Long term current use of opiate analgesic; Pharmacologic therapy; Disorder of skeletal system; Problems  influencing health status; Chronic respiratory failure with hypoxia ([place]); Polyarthralgia; Primary osteoarthritis involving multiple joints; GI bleed; Rectal bleeding; and Polyp of colon on their problem list. Today he comes in for evaluation of his Knee Pain (Bilateral ), Shoulder Pain (Bilateral ), Hand Pain (Left ), and Back Pain (Lumbar, work injury December 29, 1987)  Pain Assessment: Location: Other (Comment) (see visit info for laterality) Other (Comment) (see visit info for all pain sites.) Radiating:   Onset: More than a month ago Duration: Chronic pain Quality: Aching, Constant, Discomfort, Tender, Nagging Severity: 8 /10 (subjective, self-reported pain score)  Effect on ADL: unable to stand for any length of time. Timing: Constant Modifying factors: rest, BP: (!) 158/79  HR: 66  Onset and Duration: Date of onset: 1989 Cause of pain: Work related accident or event Severity: Getting worse, NAS-11 at its worse: 8/10, NAS-11 at its best: 7/10, NAS-11 now: 8/10, and NAS-11 on the average: 8/10 Timing: Not influenced by the time of the day Aggravating Factors: Motion and Walking Alleviating Factors:  denies Associated Problems: Spasms and Swelling Quality of Pain: Aching, Agonizing, Nagging, and Tender Previous Examinations or Tests: X-rays  Jay Sandoval is a pleasant 81 year Jay Sandoval male who presents with a chief complaint of bilateral knee pain and bilateral shoulder pain also with a history of morbid obesity.  He is being referred by Dr. Candelaria Stagers for consideration of bilateral genicular nerve block.  He states that he has tried conservative therapies including heat, ice, bracing, various p.o. medications including NSAIDs and acetaminophen as well as intra-articular steroid as well as gel injections.  Unfortunately, due to the size of our fluoroscopy table and his body habitus and weight, Mr. Mangel would not be able to do the procedure under fluoroscopy table.  I recommend that he reach  back out to Dr. Candelaria Stagers to discuss potential referral to a tertiary care center such as [facility] where they may have a fluoroscopy table able to accommodate him.  Patient endorsed understanding.  He was also able to view our fluoroscopy table and agree that it was too narrow for him to attempt to get on.  I am also concerned about my staff trying to position him on the table given his body habitus and the lack of side rails.   Meds   Current Outpatient Medications:    albuterol (PROVENTIL) (2.5 MG/3ML) 0.083% nebulizer solution, Take 2.5 mg by nebulization every 6 (six) hours as needed., Disp: , Rfl:    albuterol (VENTOLIN HFA) 108 (90 Base) MCG/ACT inhaler, Inhale 2 puffs into the lungs every 6 (six) hours as needed for wheezing or shortness of breath., Disp: , Rfl:    allopurinol (ZYLOPRIM) 100 MG tablet, Take 200 mg by mouth daily., Disp: , Rfl:    amLODipine-atorvastatin (CADUET) 5-10 MG tablet, Take 1 tablet by mouth daily., Disp: , Rfl:    ascorbic acid (VITAMIN C) 500 MG tablet, Take 500 mg by mouth daily., Disp: , Rfl:    aspirin EC 81 MG tablet, Take 81 mg by mouth daily. Swallow whole., Disp: , Rfl:    atenolol (TENORMIN) 50 MG tablet, Take 50 mg by mouth daily., Disp: , Rfl:    benazepril (LOTENSIN) 20 MG tablet, Take 20 mg by mouth daily., Disp: , Rfl:    clotrimazole-betamethasone (LOTRISONE) cream, Apply 1 application topically 2 (two) times daily., Disp: , Rfl:    Coenzyme Q10 100 MG capsule, Take 100 mg by mouth 2 (two) times daily., Disp: , Rfl:    dicyclomine (BENTYL) 10 MG capsule, Take 10 mg by mouth daily., Disp: , Rfl:    DULoxetine (CYMBALTA) 60 MG capsule, Take 60 mg by mouth daily., Disp: , Rfl:    fluticasone (FLONASE) 50 MCG/ACT nasal spray, Place 2 sprays into both nostrils daily as needed., Disp: , Rfl:    fluticasone (FLONASE) 50 MCG/ACT nasal spray, Place 2 sprays into the nose daily., Disp: , Rfl:    gabapentin (NEURONTIN) 100 MG capsule, Take 100 mg by mouth 3  (three) times daily. Patient is unsure of dosage., Disp: , Rfl:    hydrALAZINE (APRESOLINE) 50 MG tablet, Take 50 mg by mouth daily as needed., Disp: , Rfl:    levothyroxine (SYNTHROID) 25 MCG tablet, TAKE 1 TABLET BY MOUTH ONCE DAILY ON AN EMPTY STOMACH WITH A GLASS OF WATER AT LEAST 30 60 MINUTES BEFORE BREAKFAST, Disp: , Rfl:    metolazone (ZAROXOLYN) 2.5 MG tablet, Take 2.5 mg by mouth every 7 (seven) days., Disp: , Rfl:    Multiple Vitamins-Minerals (MULTIVITAL PO), Take 1 tablet by mouth daily., Disp: , Rfl:    nystatin cream (MYCOSTATIN), Apply 1 application topically daily as needed., Disp: , Rfl:    omega-3 fish oil (MAXEPA) 1000 MG CAPS capsule, Take 2 capsules by mouth 2 (two) times daily., Disp: , Rfl:    omeprazole (PRILOSEC) 20 MG capsule, Take 20 mg by mouth daily., Disp: , Rfl:    potassium chloride SA (K-DUR) 20 MEQ tablet, Take 20 mEq by mouth daily., Disp: , Rfl:    pyridOXINE (VITAMIN B-6) 100 MG tablet, Take 100 mg by mouth daily., Disp: , Rfl:    RESTASIS 0.05 % ophthalmic emulsion, Place 1 drop into both eyes 2 (two) times daily., Disp: , Rfl:    senna-docusate (SENOKOT-S) 8.6-50 MG tablet, Take  2 tablets by mouth at bedtime., Disp: , Rfl:    terazosin (HYTRIN) 2 MG capsule, Take 20 mg by mouth daily., Disp: , Rfl:    tiotropium (SPIRIVA) 18 MCG inhalation capsule, Place 18 mcg into inhaler and inhale daily., Disp: , Rfl:    tiZANidine (ZANAFLEX) 4 MG tablet, Take 4 mg by mouth 2 (two) times daily as needed., Disp: , Rfl:    torsemide (DEMADEX) 20 MG tablet, Take 20 mg by mouth daily., Disp: , Rfl:    traMADol (ULTRAM) 50 MG tablet, Take 50 mg by mouth 2 (two) times daily as needed., Disp: , Rfl:    pregabalin (LYRICA) 75 MG capsule, Take 75 mg by mouth 2 (two) times daily. (Patient not taking: Reported on 11/23/2021), Disp: , Rfl:   Imaging Review   Narrative CLINICAL DATA:  Chronic bilateral shoulder pain.  EXAM: RIGHT SHOULDER - 2+ VIEW  COMPARISON:   None.  FINDINGS: There is no evidence of fracture or dislocation. Moderate degenerative changes seen involving the right acromioclavicular joint. Soft tissues are unremarkable.  IMPRESSION: Moderate degenerative joint disease of the right acromioclavicular joint. No acute abnormality seen in the right shoulder.   Electronically Signed By: Marijo Conception, M.D. On: 09/08/2017 08:35  Shoulder-L DG: Results for orders placed during the hospital encounter of 09/07/17  DG Shoulder Left  Narrative CLINICAL DATA:  Chronic bilateral shoulder pain.  EXAM: LEFT SHOULDER - 2+ VIEW  COMPARISON:  None.  FINDINGS: There is no evidence of fracture or dislocation. Mild narrowing of the glenohumeral joint is noted. Soft tissues are unremarkable.  IMPRESSION: Mild degenerative joint disease of the left glenohumeral joint. No acute abnormality seen in the left shoulder.   Electronically Signed By: Marijo Conception, M.D. On: 09/08/2017 08:37   DG Lumbar Spine Complete W/Bend  Narrative CLINICAL DATA:  Chronic low back pain.  EXAM: LUMBAR SPINE - COMPLETE WITH BENDING VIEWS  COMPARISON:  CT scan of April 23, 2015.  FINDINGS: No fracture or spondylolisthesis is noted. Moderate degenerative disc disease is noted at all levels of the lumbar spine with anterior osteophyte formation. No change in vertebral body alignment is noted on flexion or extension views.  IMPRESSION: Moderate multilevel degenerative disc disease. No acute abnormality seen in the lumbar spine.   Electronically Signed By: Marijo Conception, M.D. On: 09/08/2017 08:31  DG Knee 1-2 Views Right  Narrative CLINICAL DATA:  Chronic bilateral knee pain.  EXAM: RIGHT KNEE - 1-2 VIEW  COMPARISON:  Radiographs of January 08, 2007.  FINDINGS: No evidence of fracture, dislocation, or joint effusion. Severe narrowing of medial joint space is noted with osteophyte formation. Moderate spurring of superior  aspect of patella is noted. Moderate narrowing of patellofemoral space is noted. Moderate narrowing of lateral joint space is noted with osteophyte formation. Soft tissues are unremarkable.  IMPRESSION: Tricompartmental degenerative joint disease as described above. No acute abnormality seen in the right knee.   Electronically Signed By: Marijo Conception, M.D. On: 09/08/2017 08:33  Narrative CLINICAL DATA:  Chronic bilateral knee pain.  EXAM: LEFT KNEE - 1-2 VIEW  COMPARISON:  Radiographs of January 08, 2007.  FINDINGS: No evidence of fracture, dislocation, or joint effusion. Severe narrowing of patellofemoral space is noted as well as the medial joint space with osteophyte formation. Moderate narrowing of lateral joint space is noted. Soft tissues are unremarkable.  IMPRESSION: Tricompartmental degenerative joint disease as described above. No acute abnormality seen in the left knee.  Electronically Signed By: Marijo Conception, M.D. On: 09/08/2017 08:32 DG Wrist Complete Left  Narrative CLINICAL DATA:  Chronic left wrist pain.  EXAM: LEFT WRIST - COMPLETE 3+ VIEW  COMPARISON:  None.  FINDINGS: There is no evidence of fracture or dislocation. Severe narrowing and sclerosis of the first carpometacarpal joint is noted. Narrowing and sclerosis is seen in the joint space between the scaphoid and trapezium. Soft tissues are unremarkable.  IMPRESSION: Osteoarthritis of the first carpometacarpal joint. No acute abnormality seen in the left wrist.   Electronically Signed By: Marijo Conception, M.D. On: 09/08/2017 08:39   Hand Imaging: Hand-R DG Complete: No results found for this or any previous visit.  Hand-L DG Complete: Results for orders placed during the hospital encounter of 09/07/17  DG Hand Complete Left  Narrative CLINICAL DATA:  Chronic left hand pain.  EXAM: LEFT HAND - COMPLETE 3+ VIEW  COMPARISON:  None.  FINDINGS: There is no evidence  of fracture or dislocation. Narrowing and sclerosis of the first carpometacarpal joint is noted. Linear foreign body is seen in the soft tissues adjacent to the distal tuft of the third distal phalanx.  IMPRESSION: Osteoarthritis of the first carpometacarpal joint. Linear foreign body seen in soft tissues adjacent to the distal tuft of third distal phalanx. No acute abnormality seen in the left hand.   Electronically Signed By: Marijo Conception, M.D. On: 09/08/2017 08:41   Complexity Note: Imaging results reviewed.                         ROS  Cardiovascular: Daily Aspirin intake and High blood pressure Pulmonary or Respiratory: Wheezing and difficulty taking a deep full breath (Asthma), Shortness of breath, Snoring , Coughing up mucus (Bronchitis), and Temporary stoppage of breathing during sleep Neurological: No reported neurological signs or symptoms such as seizures, abnormal skin sensations, urinary and/or fecal incontinence, being born with an abnormal open spine and/or a tethered spinal cord Psychological-Psychiatric: No reported psychological or psychiatric signs or symptoms such as difficulty sleeping, anxiety, depression, delusions or hallucinations (schizophrenial), mood swings (bipolar disorders) or suicidal ideations or attempts Gastrointestinal: Reflux or heatburn Genitourinary: No reported renal or genitourinary signs or symptoms such as difficulty voiding or producing urine, peeing blood, non-functioning kidney, kidney stones, difficulty emptying the bladder, difficulty controlling the flow of urine, or chronic kidney disease Hematological: No reported hematological signs or symptoms such as prolonged bleeding, low or poor functioning platelets, bruising or bleeding easily, hereditary bleeding problems, low energy levels due to low hemoglobin or being anemic Endocrine: No reported endocrine signs or symptoms such as high or low blood sugar, rapid heart rate due to high  thyroid levels, obesity or weight gain due to slow thyroid or thyroid disease Rheumatologic: Rheumatoid arthritis Musculoskeletal: Negative for myasthenia gravis, muscular dystrophy, multiple sclerosis or malignant hyperthermia Work History: Retired  Allergies  Mr. Ryser has No Known Allergies.  Laboratory Chemistry Profile   Renal Lab Results  Component Value Date   BUN 14 07/10/2020   CREATININE 0.85 07/10/2020   BCR 15 09/07/2017   GFRAA >60 12/05/2018   GFRNONAA >60 07/10/2020   PROTEINUR NEGATIVE 12/05/2018     Electrolytes Lab Results  Component Value Date   NA 145 07/10/2020   K 3.5 07/10/2020   CL 110 07/10/2020   CALCIUM 9.2 07/10/2020   MG 2.0 07/10/2020     Hepatic Lab Results  Component Value Date   AST 45 (H)  07/10/2020   ALT 49 (H) 07/10/2020   ALBUMIN 3.5 07/10/2020   ALKPHOS 49 07/10/2020   LIPASE 28 12/05/2018     ID Lab Results  Component Value Date   SARSCOV2NAA NEGATIVE 07/09/2020     Bone Lab Results  Component Value Date   25OHVITD1 36 09/07/2017   25OHVITD2 <1.0 09/07/2017   25OHVITD3 36 09/07/2017     Endocrine Lab Results  Component Value Date   GLUCOSE 103 (H) 07/10/2020   GLUCOSEU NEGATIVE 12/05/2018     Neuropathy Lab Results  Component Value Date   VITAMINB12 786 09/07/2017     CNS No results found for: "COLORCSF", "APPEARCSF", "RBCCOUNTCSF", "WBCCSF", "POLYSCSF", "LYMPHSCSF", "EOSCSF", "PROTEINCSF", "GLUCCSF", "JCVIRUS", "CSFOLI", "IGGCSF", "LABACHR", "ACETBL"   Inflammation (CRP: Acute  ESR: Chronic) Lab Results  Component Value Date   CRP 3.1 09/07/2017   ESRSEDRATE 27 09/07/2017     Rheumatology No results found for: "RF", "ANA", "LABURIC", "URICUR", "LYMEIGGIGMAB", "LYMEABIGMQN", "HLAB27"   Coagulation Lab Results  Component Value Date   INR 1.1 07/09/2020   LABPROT 13.4 07/09/2020   PLT 123 (L) 07/10/2020     Cardiovascular Lab Results  Component Value Date   HGB 14.2 08/10/2020   HCT 37.2 (L)  07/10/2020     Screening Lab Results  Component Value Date   SARSCOV2NAA NEGATIVE 07/09/2020     Cancer No results found for: "CEA", "CA125", "LABCA2"   Allergens No results found for: "ALMOND", "APPLE", "ASPARAGUS", "AVOCADO", "BANANA", "BARLEY", "BASIL", "BAYLEAF", "GREENBEAN", "LIMABEAN", "WHITEBEAN", "BEEFIGE", "REDBEET", "BLUEBERRY", "BROCCOLI", "CABBAGE", "MELON", "CARROT", "CASEIN", "CASHEWNUT", "CAULIFLOWER", "CELERY"     Note: Lab results reviewed.  [place]  Drug: Mr. Oki  reports no history of drug use. Alcohol:  reports no history of alcohol use. Tobacco:  reports that he quit smoking about 6 years ago. His smoking use included cigarettes. He has never used smokeless tobacco. Medical:  has a past medical history of Arthritis, Asthma, Hypertension, and Osteoporosis. Family: family history is not on file.  Past Surgical History:  Procedure Laterality Date   COLONOSCOPY N/A 07/10/2020   Procedure: COLONOSCOPY;  Surgeon: Virgel Manifold, MD;  Location: [facility] ENDOSCOPY;  Service: Endoscopy;  Laterality: N/A;   KNEE ARTHROPLASTY     SHOULDER ARTHROSCOPY DISTAL CLAVICLE EXCISION AND OPEN ROTATOR CUFF REPAIR     Active Ambulatory Problems    Diagnosis Date Noted   DDD (degenerative disc disease), lumbar 10/20/2014   Obesity 03/22/2016   Swelling of limb 03/22/2016   Lymphedema 03/22/2016   Asthma without status asthmaticus 09/07/2017   Back muscle spasm 02/26/2015   Chest pain with high risk for cardiac etiology 06/23/2015   Chronic lower back pain 09/07/2017   Congestive heart failure ([place]) 03/11/2015   COPD (chronic obstructive pulmonary disease) ([place]) 09/07/2017   GERD (gastroesophageal reflux disease) 09/07/2017   Hyperlipidemia, unspecified 09/07/2017   Osteoarthritis of knees, bilateral 11/19/2013   Preop cardiovascular exam 07/07/2015   Psoriasis 09/07/2017   Sleep apnea 09/07/2017   SOB (shortness of breath) on exertion 06/23/2015   Hypertension  09/07/2017   Dependence on continuous positive airway pressure ventilation 06/17/2015   Venous stasis 03/11/2015   Asthma 03/11/2015   Benign hypertension 03/11/2015   Hypercholesterolemia 03/11/2015   Obstructive sleep apnea syndrome 03/11/2015   Chronic pain of both knees (Primary Area of Pain)(R>L) 09/07/2017   Chronic pain of both shoulders  (Secondary Area of Pain)(R>L) 09/07/2017   Chronic bilateral low back pain without sciatica (Tertiary Area of Pain)(R>L) 09/07/2017   Wrist pain,  chronic, left (Fourth Area of Pain) 09/07/2017   Chronic hand pain, left 09/07/2017   Chronic pain syndrome 09/07/2017   Long term current use of opiate analgesic 09/07/2017   Pharmacologic therapy 09/07/2017   Disorder of skeletal system 09/07/2017   Problems influencing health status 09/07/2017   Chronic respiratory failure with hypoxia ([place]) 04/13/2018   Polyarthralgia 11/12/2018   Primary osteoarthritis involving multiple joints 11/12/2018   GI bleed 07/09/2020   Rectal bleeding    Polyp of colon    Resolved Ambulatory Problems    Diagnosis Date Noted   High blood pressure 05/18/2015   Osteoporosis 05/18/2015   Rheumatoid arthritis ([place]) 05/18/2015   Chronic bronchitis ([place]) 05/18/2015   Circulation problem 05/18/2015   Past Medical History:  Diagnosis Date   Arthritis    Constitutional Exam  General appearance: morbidly obese very pleasant, cooperative Vitals:   11/23/21 0951  BP: (!) 158/79  Pulse: 66  Resp: 16  Temp: (!) 97.4 F (36.3 C)  TempSrc: Temporal  SpO2: 96%  Weight: (!) 383 lb (173.7 kg)  Height: _0  (1.702 m)   BMI Assessment: Estimated body mass index is 59.99 kg/m as calculated from the following:   Height as of this encounter: _1  (1.702 m).   Weight as of this encounter: 383 lb (173.7 kg).  BMI interpretation table: BMI level Category Range association with higher incidence of chronic pain  <18 kg/m2 Underweight   18.5-24.9 kg/m2 Ideal body weight    25-29.9 kg/m2 Overweight Increased incidence by 20%  30-34.9 kg/m2 Obese (Class I) Increased incidence by 68%  35-39.9 kg/m2 Severe obesity (Class II) Increased incidence by 136%  >40 kg/m2 Extreme obesity (Class III) Increased incidence by 254%   Patient's current BMI Ideal Body weight  Body mass index is 59.99 kg/m. Ideal body weight: 66.1 kg (145 lb 11.6 oz) Adjusted ideal body weight: 109.2 kg (240 lb 10.2 oz)   BMI Readings from Last 4 Encounters:  11/23/21 59.99 kg/m  08/10/20 56.56 kg/m  07/09/20 60.23 kg/m  01/09/20 61.71 kg/m   Wt Readings from Last 4 Encounters:  11/23/21 (!) 383 lb (173.7 kg)  08/10/20 (!) 383 lb (173.7 kg)  07/09/20 (!) 407 lb 13.6 oz (185 kg)  01/09/20 (!) 394 lb (178.7 kg)    Psych/Mental status: Alert, oriented x 3 (person, place, & time)       Eyes: PERLA Respiratory: No evidence of acute respiratory distress  Extremely limited knee range of motion, patient came in today in wheelchair. Has a lot of difficulty ambulating.  Is very deconditioned.  Morbid obesity.  Assessment  Primary Diagnosis & Pertinent Problem List: The primary encounter diagnosis was Bilateral primary osteoarthritis of knee. Diagnoses of Morbid obesity ([place]) and Chronic pain syndrome were also pertinent to this visit.  Visit Diagnosis (New problems to examiner): 1. Bilateral primary osteoarthritis of knee   2. Morbid obesity ([place])   3. Chronic pain syndrome    Plan of Care (Initial workup plan)  Unfortunately, given the patient's weight, body habitus and our fluoroscopy table dimensions, he is not able to safely get onto our table to do this procedure.  Recommend referral to a tertiary care center where they may have a larger fluoroscopy table to accommodate his weight. He can discuss this with Dr. Candelaria Stagers or his primary care provider.       Provider-requested follow-up: No follow-ups on file.  I spent a total of 30 minutes reviewing chart data, face-to-face  evaluation with the  patient, counseling and coordination of care as detailed above.  No future appointments.  Note by: Gillis Santa, MD Date: 11/23/2021; Time: 11:14 AM

## 2021-11-23 NOTE — Progress Notes (Signed)
Safety precautions to be maintained throughout the outpatient stay will include: orient to surroundings, keep bed in low position, maintain call bell within reach at all times, provide assistance with transfer out of bed and ambulation.  

## 2022-09-12 NOTE — Therapy (Signed)
OUTPATIENT PHYSICAL THERAPY WHEELCHAIR EVALUATION   Patient Name: Jay Sandoval MRN: [id number] DOB:04-Oct-1940, 82 y.o., male Today's Date: 09/13/2022  END OF SESSION:  PT End of Session - 09/13/22 1139     Visit Number 1    Number of Visits 1    Date for PT Re-Evaluation 09/13/22    PT Start Time 1015    PT Stop Time 1106    PT Time Calculation (min) 51 min    Activity Tolerance Patient limited by pain    Behavior During Therapy [facility] for tasks assessed/performed             Past Medical History:  Diagnosis Date   Arthritis    Asthma    Hypertension    Osteoporosis    Past Surgical History:  Procedure Laterality Date   COLONOSCOPY N/A 07/10/2020   Procedure: COLONOSCOPY;  Surgeon: Pasty Spillers, MD;  Location: [facility] ENDOSCOPY;  Service: Endoscopy;  Laterality: N/A;   KNEE ARTHROPLASTY     SHOULDER ARTHROSCOPY DISTAL CLAVICLE EXCISION AND OPEN ROTATOR CUFF REPAIR     Patient Active Problem List   Diagnosis Date Noted   Rectal bleeding    Polyp of colon    GI bleed 07/09/2020   Polyarthralgia 11/12/2018   Primary osteoarthritis involving multiple joints 11/12/2018   Chronic respiratory failure with hypoxia (HCC) 04/13/2018   Asthma without status asthmaticus 09/07/2017   Chronic lower back pain 09/07/2017   COPD (chronic obstructive pulmonary disease) (HCC) 09/07/2017   GERD (gastroesophageal reflux disease) 09/07/2017   Hyperlipidemia, unspecified 09/07/2017   Psoriasis 09/07/2017   Sleep apnea 09/07/2017   Hypertension 09/07/2017   Chronic pain of both knees (Primary Area of Pain)(R>L) 09/07/2017   Chronic pain of both shoulders  (Secondary Area of Pain)(R>L) 09/07/2017   Chronic bilateral low back pain without sciatica (Tertiary Area of Pain)(R>L) 09/07/2017   Wrist pain, chronic, left (Fourth Area of Pain) 09/07/2017   Chronic hand pain, left 09/07/2017   Chronic pain syndrome 09/07/2017   Long term current use of opiate analgesic 09/07/2017    Pharmacologic therapy 09/07/2017   Disorder of skeletal system 09/07/2017   Problems influencing health status 09/07/2017   Obesity 03/22/2016   Swelling of limb 03/22/2016   Lymphedema 03/22/2016   Preop cardiovascular exam 07/07/2015   Chest pain with high risk for cardiac etiology 06/23/2015   SOB (shortness of breath) on exertion 06/23/2015   Dependence on continuous positive airway pressure ventilation 06/17/2015   Congestive heart failure (HCC) 03/11/2015   Venous stasis 03/11/2015   Asthma 03/11/2015   Benign hypertension 03/11/2015   Hypercholesterolemia 03/11/2015   Obstructive sleep apnea syndrome 03/11/2015   Back muscle spasm 02/26/2015   DDD (degenerative disc disease), lumbar 10/20/2014   Osteoarthritis of knees, bilateral 11/19/2013    PCP: Luciano Cutter  REFERRING PROVIDER: Luciano Cutter   REFERRING DIAG: morbid obesity of BMI 60-69.9, DDD lumbar, ambulatory dysfunction lymphedema, COPD, CHF, OA of multiple spots  THERAPY DIAG:  Difficulty in walking, not elsewhere classified  ONSET DATE: >15 years ago  Rationale for Evaluation and Treatment: Rehabilitation  SUBJECTIVE:  SUBJECTIVE STATEMENT: Patient presents for wheelchair evaluation.   PERTINENT HISTORY:  Patient presents for wheelchair evaluation. PMH includes COPD, chronic pain of both shoulders, chronic bilateral low backpain, wrist pain L, hand pain L, chronic pain syndrome, long term use of opiate antalgesic, disorder of skeletal system, DDD, obesity, swelling of limb, lymphedema, asthma, HLD, OA of knees, psoriasis, SOB, HTN, CHF, COPD, GERD, asthma,  PAIN:  Are you having pain? Yes: NPRS scale: 9/10 Pain location: bilateral knees, low back, shoulders.  Pain description: aching Aggravating factors:  weightbearing, movement Relieving factors: rest  PRECAUTIONS: Fall  WEIGHT BEARING RESTRICTIONS: No  FALLS:  Has patient fallen in last 6 months? Yes. Number of falls no; last fall 3years ago per patient report   LIVING ENVIRONMENT: Lives with: lives alone Lives in: House/apartment Stairs:  ramp Has following equipment at home: Wheelchair (power) and Grab bars  OCCUPATION: retired  PLOF:  power wheelchair user for >15 years   PATIENT GOALS: to obtain power wheelchair.    PATIENT INFORMATION: This Evaluation form will serve as the LMN for the following suppliers:  Supplier: [employer] Contact Person: Melrose Nakayama, ATP Phone: [phone]   Reason for Referral: power chair Patient/caregiver Goals: Patient was seen for face-to-face evaluation for new power wheelchair.  Also present was    Melrose Nakayama from [employer]  to discuss recommendations and wheelchair options.  Further paperwork was completed and sent to vendor.  Patient appears to qualify for power mobility device at this time per objective findings.   MEDICAL HISTORY: Diagnosis:morbid obesity of BMI 60-69.9, DDD lumbar, ambulatory dysfunction lymphedema, COPD, CHF, OA of multiple spots Primary Diagnosis Onset: about 15 years ago [] Progressive Disease Relevant Past and Future Surgeries:knee arthroplasty, shoulder arthroscopy distal clavicle exision and open rotator cuff repair.  Height:5 ft 8 Weight: 399 lb  Explain and recent changes or trends in weight:  Patient presents for wheelchair evaluation. PMH includes COPD, chronic pain of both shoulders, chronic bilateral low backpain, wrist pain L, hand pain L, chronic pain syndrome, long term use of opiate antalgesic, disorder of skeletal system, DDD, obesity, swelling of limb, lymphedema, asthma, HLD, OA of knees, psoriasis, SOB, HTN, CHF, COPD, GERD, asthma. Patient utilizes power chair at baseline, has been utilizing for 15 years.     Relevant History including  falls:     HOME ENVIRONMENT: [] House  [] Condo/town home  [x] Apartment  [] Assisted Living    [x] Lives Alone []  Lives with Others                                                    Hours with caregiver:  7 days a week; 40 hours a week.   [x] Home is accessible to patient            Stairs  [] Yes [x]  No     Ramp [x] Yes [] No Comments:  Bathroom accessible, tile floors, ramp from doorsteps, Widened doorframes.    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                                    FUNCTIONAL/SENSORY PROCESSING SKILLS:  Handedness:   [x] Right     [] Left    [] NA  Comments:                                 Functional Processing Skills for Wheeled Mobility [x] Processing Skills are adequate for safe wheelchair operation  Areas of concern than may interfere with safe operation of wheelchair Description of problem   []  Attention to environment     [] Judgment     []  Hearing  []  Vision or visual processing    [] Motor Planning  []  Fluctuations in Behavior                                                   VERBAL COMMUNICATION: [x] WFL receptive [x]  WFL expressive [] Understandable  [] Difficult to understand  [] non-communicative []  Uses an augmented communication device    CURRENT SEATING / MOBILITY: Current Mobility Base:   [] None  [] Dependent  [] Manual  [] Scooter  [x] Power   Type of Control:                       Manufacturer:     [employer]                    Size:  XL                       Age:     2017                      Current Condition of Mobility Base:        worn, breakdown of motor                                                                                                             Current Wheelchair components:    has been having issues with the motors, standard XL power chair  Describe posture in present seating system:    seat is too short causing high fall risk.                                                                         SENSATION and SKIN ISSUES: Sensation [x] Intact [] Impaired [] Absent   Level of sensation:                           Pressure Relief: Able to perform effective pressure relief :   [x] Yes  []  No Method:  Tricep press, lateral scoot from chair to table.                                                                             If not, Why?:                                                                          Skin Issues/Skin Integrity Current Skin Issues   [] Yes [x] No  [] Intact []  Red area []  Open Area  [] Scar Tissue [] At risk from prolonged sitting  Where                              History of Skin Issues   [] Yes [x] No  Where                                         When                                               Hx of skin flap surgeries [] Yes [x] No  Where                  When                                                  Limited sitting tolerance [x] Yes [] No Hours spent sitting in wheelchair daily: enough to do ADLs and get to recliner.  ; non ambulatory  Complaint of Pain:  Please describe:    bilateral knee pain, back pain, bilateral shoulders ; Goes to Pain clinic for chronic pain                                                                                                     Swelling/Edema:    history of lymphedema.                                                                                                                                             ADL STATUS (in reference to wheelchair use):  Indep Assist Unable Indep with Equip Not assessed Comments  Dressing                  x                                     Is able to doff clothes at end of day but aide helps with dressing                  Eating      x                                                                                                                        Toileting             x                                                   Uses power chair to transfer to commode, sometimes needs help if sitting on commode too long  Bathing            x                                                   Bed bath by aide                                                                      Grooming/ Hygiene                                      x                                                                                        Meal Prep                x                                          Aides help                                                                IADLS                x                                              Aide helps                                                    Bowel Management: [x] Continent  [] Incontinent  [] Accidents Comments:                                                  Bladder Management: [] Continent  [x] Incontinent  [] Accidents Comments:   on fluid pills, has urinal setting out.  WHEELCHAIR SKILLS: Manual w/c Propulsion: [] UE or LE strength and endurance sufficient to participate in ADLs using manual wheelchair Arm :  [] left [] right  [] Both                                   Foot:   [] left [] right  [] Both  Distance:   Operate Scooter: []  Strength, hand grip, balance and transfer appropriate for use [] Living environment is accessible for use of scooter  Operate Power w/c:  [x]  Std. Joystick   []  Alternative  Controls Indep [x]  Assist []  Dependent/ Unable []  N/A []  [] Safe          []  Functional      Distance:                Bed confined without wheelchair [x]  Yes []  No   STRENGTH/RANGE OF MOTION:  Range of Motion Strength  Shoulder      Flexion: L 91 R 89 abduction: L 92 R 90  Extension L 35, R 52                                                   Limited by pain and ROM: 2+/5                                                     Elbow  WFL; limited by body habitus                                                 3/5                                                              Wrist/Hand WFL                                                               3+/5                                                                         Hip  Limited by body habitus  3/5                                                             Knee             Limited by body habitus                                                       4-/5    painful                                                       Ankle Limited by body habitus     3+/5                                                                 MOBILITY/BALANCE:  []  Patient is totally dependent for mobility                                                                                               Balance Transfers Ambulation  Sitting Balance: Standing Balance: []  Independent []  Independent/Modified Independent  []  WFL     []  WFL []  Supervision []  Supervision  [x]  Uses UE for balance  []  Supervision []  Min Assist []  Ambulates with Assist                           []  Min Assist []  Min assist []  Mod Assist []  Ambulates with Device:  []  RW   []  StW   []  Cane   []                 []  Mod Assist []  Mod assist []  Max assist   []  Max Assist []  Max assist []  Dependent []  Indep. Short Distance Only  []  Unable [x]  Unable []  Lift / Sling Required Distance (in feet)                             [x]   Sliding board/ scoot lateral transfer  [x]  Unable to Ambulate: (Explain:non ambulatory since 2010)  Cardio Status:  [] Intact  [x]  Impaired   []  NA  Respiratory Status:  [] Intact   [x] Impaired   [] NA      has oxygen                               Orthotics/Prosthetics:   n/a                                                                      Comments (Address manual vs power w/c vs scooter):      Patient presents for evaluation of power chair. He is non ambulatory at baseline, since prior to 2018, and only scoot transfers due to pain and limited mobility. He has significant ROM limitations of Ue's and pain with UE movement in addition to not being able to self propel in manual chair. He additionally does not have the trunk control and has significant back pain not allowing for use of scooter.     Patient is dependent on a power wheelchair for all mobility and this is the best option to continue maintaining his independence and ability to assist with ADL performance. He has significant pain in multiple joints, unable to tolerate standing, and has the knowledge and experience with utilizing a power chair.                                         Anterior / Posterior Obliquity Rotation-Pelvis  PELVIS    [] Neutral  [x]  Posterior  []  Anterior     [x] WFL  [] Right Elevated  [] Left Elevated   [x] WFL  [] Right Anterior []   Left Anterior    []  Fixed [x]  Partly Flexible []  Flexible  []  Other  []  Fixed  [x]  Partly Flexible  []  Flexible []  Other  []  Fixed  [x]  Partly Flexible  []  Flexible []  Other  TRUNK [] WFL [x] Thoracic Kyphosis [] Lumbar Lordosis   [x]  WFL [] Convex Right [] Convex Left   [] c-curve [] s-curve [] multiple  [x]  Neutral []  Left-anterior []  Right-anterior    []  Fixed []  Flexible [x]  Partly Flexible       Other  []  Fixed []  Flexible [x]  Partly Flexible []  Other  []  Fixed           []  Flexible [x]  Partly Flexible []  Other   Position  Windswept   HIPS  []  Neutral []  Abduct [x]  ADduct [x]  Neutral []  Right []  Left       []  Fixed  [x]  Partly Flexible             []  Dislocated []  Flexible []  Subluxed    []  Fixed [x]  Partly Flexible  []  Flexible []  Other              Foot Positioning Knee Positioning   Knees and  Feet  [x]  WFL [] Left [] Right [x]  WFL [] Left [] Right   KNEES ROM concerns: ROM concerns:   & Dorsi-Flexed                    [] Lt [] Rt  FEET Plantar Flexed                  [] Lt [] Rt     Inversion                    [] Lt [] Rt     Eversion                    [] Lt [] Rt    HEAD [x]  Functional [x]  Good Head Control   & []  Flexed         []  Extended []  Adequate Head Control   NECK []  Rotated  Lt  []  Lat Flexed Lt []  Rotated  Rt []  Lat Flexed Rt []  Limited Head Control    []  Cervical Hyperextension []  Absent  Head Control    SHOULDERS ELBOWS WRIST& HAND         Left     Right    Left     Right  U/E [] Functional  Left            [] Functional  Right                                 [] Fisting             [] Fisting     [] elevated Left [x] depressed  Left [] elevated Right [x] depressed  Right      [x] protracted Left [] retracted Left [x] protracted Right [] retracted Right [] subluxed  Left              [] subluxed  Right         Goals for Wheelchair Mobility  [x]  Independence with mobility in the home with motor related ADLs (MRADLs)  [x]  Independence with MRADLs in the community []  Provide dependent mobility  []  Provide recline     [] Provide tilt   Goals for Seating system [x]  Optimize pressure distribution [x]  Provide support needed to facilitate function or safety [x]  Provide corrective forces to assist with maintaining or improving posture [x]  Accommodate client's posture: current seated postures and positions are not flexible or will not tolerate corrective forces [x]  Client to be independent with relieving pressure in the wheelchair [] Enhance  physiological function such as breathing, swallowing, digestion  Simulation ideas/Equipment trials:     [employer]                                                                                            State why other equipment was unsuccessful:     Patient is non ambulatory at baseline since prior to 2018, and only scoot transfers due to pain and limited mobility. He has significant ROM limitations of Ue's and pain with UE movement in addition to not being able to self propel in manual chair. He additionally does not have the trunk control and has significant back pain not allowing for use of scooter.    Patient is dependent on a power wheelchair for all mobility and this is the best option to continue maintaining his independence and ability to assist with ADL performance.  He has significant pain in multiple joints, unable to tolerate standing, and has the knowledge and experience with utilizing a power chair.     The [employer] power chair is the best option for this patient to optimize his mobility and independence with living.   He requires a heavy duty chair due to body habitus. A captain back and seat with provide postural support and improve trunk alignment. Adjustable height, flip back, full length pads will provide support for Ue's, and change heigh/angles for ADLS, flip back will assist for transfers as patient primarily uses scoot transfers at this time. A center mount flip up depth/angle adjustable foot plate will provide foot support when moving, and flip up to allow safe transfers. Flat free insert tires will decrease maintenance and frequent flats. A pelvic belt with decrease falling out of chair. He will require a proportional joystick on the R to provide access for controlling his wheelchair with a swing away mount for access and transfers. Two NF 22 batteries will be requires to power chair.  In conclusion patient will require a [employer] power chair to promote functional safe  mobility and independence.                                                                 MOBILITY BASE RECOMMENDATIONS and JUSTIFICATION: MOBILITY COMPONENT JUSTIFICATION  Manufacturer:   Administrator, arts:  [employer]            Size: Width  22         Seat Depth  20          [x] provide transport from point A to B [x] promote Indep mobility  [x] is not a safe, functional ambulator [x] walker or cane inadequate [x] non-standard width/depth necessary to accommodate anatomical measurement []                             [] Manual Mobility Base [] non-functional ambulator    [] Scooter/POV  [] can safely operate  [] can safely transfer   [] has adequate trunk stability  [] cannot functionally propel manual w/c  [x] Power Mobility Base  [x] non-ambulatory  [x] cannot functionally propel manual wheelchair  [x]  cannot functionally and safely operate scooter/POV [x] can safely operate and willing to  [] Stroller Base [] infant/child  [] unable to propel manual wheelchair [] allows for growth [] non-functional ambulator [] non-functional UE [] Indep mobility is not a goal at this time  [] Tilt  [] Forward                   [] Backward                  [] Powered tilt              [] Manual tilt  [] change position against gravitational force on head and shoulders  [] change position for pressure relief/cannot weight shift [] transfers  [] management of tone [] rest periods [] control edema [] facilitate postural control  []                                       [] Recline  [] Power recline on power base [] Manual recline on manual base  []   accommodate femur to back angle  [] bring to full recline for ADL care  [] change position for pressure relief/cannot weight shift [] rest periods [] repositioning for transfers or clothing/diaper /catheter changes [] head positioning  [] Lighter weight required [] self- propulsion  [] lifting []                                                 [x] Heavy Duty required [x] user weight greater than  250# [] extreme tone/ over active movement [] broken frame on previous chair []                                     [x]  Back (captain) []  Angle Adjustable []  Custom molded                           [x] postural control [] control of tone/spasticity [] accommodation of range of motion [] UE functional control [] accommodation for seating system []                                          [] provide lateral trunk support [] accommodate deformity [x] provide posterior trunk support [x] provide lumbar/sacral support [] support trunk in midline [] Pressure relief over spinal processes  [x]  Seat Cushion (captain)                       [] impaired sensation  [] decubitus ulcers present [] history of pressure ulceration [x] prevent pelvic extension [] low maintenance  [] stabilize pelvis  [] accommodate obliquity [] accommodate multiple deformity [] neutralize lower extremity position [x] increase pressure distribution []                                           []  Pelvic/thigh support  []  Lateral thigh guide []  Distal medial pad  []  Distal lateral pad []  pelvis in neutral [] accommodate pelvis []  position upper legs []  alignment []  accommodate ROM []  decrease adduction [] accommodate tone [] removable for transfers [] decrease abduction  []  Lateral trunk Supports []  Lt     []  Rt [] decrease lateral trunk leaning [] control tone [] contour for increased contact [] safety  [] accommodate asymmetry []                                                 []  Mounting hardware  [] lateral trunk supports  [] back   [] seat [] headrest      []  thigh support [] fixed   [] swing away [] attach seat platform/cushion to w/c frame [] attach back cushion to w/c frame [] mount postural supports [] mount headrest  [] swing medial thigh support away [] swing lateral supports away for transfers  []                                                     Armrests  [] fixed [x] adjustable height [] removable   [] swing away  [x] flip back    [] reclining [  x]full length pads [] desk    [] pads tubular  [x] provide support with elbow at 90   [x] provide support for w/c tray [x] change of height/angles for variable activities [] remove for transfers [x] allow to come closer to table top [] remove for access to tables []                                               Hangers/ Leg rests  [] 60 [] 70 [] 90 [] elevating [] heavy duty  [] articulating [] fixed [] lift off [] swing away     [] power [] provide LE support  [] accommodate to hamstring tightness [] elevate legs during recline   [] provide change in position for Legs [] Maintain placement of feet on footplate [] durability [] enable transfers [] decrease edema [] Accommodate lower leg length []                                         Foot support Footplate    [] Lt  []  Rt  [x]  Center mount [x] flip up                            [x] depth/angle adjustable [] Amputee adapter    []  Lt     []  Rt [x] provide foot support [] accommodate to ankle ROM [x] transfers [] Provide support for residual extremity []  allow foot to go under wheelchair base []  decrease tone  []                                                 []  Ankle strap/heel loops [] support foot on foot support [] decrease extraneous movement [] provide input to heel  [] protect foot  Tires: [] pneumatic  [x] flat free inserts  [] solid  [x] decrease maintenance  [x] prevent frequent flats [] increase shock absorbency [] decrease pain from road shock [] decrease spasms from road shock []                                              []  Headrest  [] provide posterior head support [] provide posterior neck support [] provide lateral head support [] provide anterior head support [] support during tilt and recline [] improve feeding   [] improve respiration [] placement of switches [] safety  [] accommodate ROM  [] accommodate tone [] improve visual orientation  []  Anterior chest strap []  Vest []  Shoulder retractors  [] decrease forward movement of  shoulder [] accommodation of TLSO [] decrease forward movement of trunk [] decrease shoulder elevation [] added abdominal support [] alignment [] assistance with shoulder control  []                                               Pelvic Positioner [x] Belt [] SubASIS bar [] Dual Pull [] stabilize tone [x] decrease falling out of chair/ **will not Decrease potential for sliding due to pelvic tilting [] prevent excessive rotation [] pad for protection over boney prominence [] prominence comfort [] special pull angle to control rotation []   Upper ExtremitySupport  [] L   []  R [] Arm trough   [] hand support []  tray       [] full tray [] swivel mount [] decrease edema      [] decrease subluxation   [] control tone   [] placement for AAC/Computer/EADL [] decrease gravitational pull on shoulders [] provide midline positioning [] provide support to increase UE function [] provide hand support in natural position [] provide work surface   POWER WHEELCHAIR CONTROLS  [x] Proportional  [] Non-Proportional Type    Joystick                                  [] Left  [x] Right [x] provides access for controlling wheelchair   [] lacks motor control to operate proportional drive control [] unable to understand proportional controls  Actuator Control Module  [] Single  [] Multiple   [] Allow the client to operate the power seat function(s) through the joystick control   [] Safety Reset Switches [] Used to change modes and stop the wheelchair when driving in latch mode    [] Upgraded Electronics   [] programming for accurate control [] progressive Disease/changing condition [] non-proportional drive control needed [] Needed in order to operate power seat functions through joystick control   [] Display box [] Allows user to see in which mode and drive the wheelchair is set  [] necessary for alternate controls    [] Digital interface electronics [] Allows w/c to operate when using alternative  drive controls  [] ASL Head Array [] Allows client to operate wheelchair  through switches placed in tri-panel headrest  [] Sip and puff with tubing kit [] needed to operate sip and puff drive controls  [] Upgraded tracking electronics [] increase safety when driving [] correct tracking when on uneven surfaces  [x] Mount for switches or joystick [] Attaches switches to w/c  [x] Swing away for access or transfers [] midline for optimal placement [] provides for consistent access  [] Attendant controlled joystick plus mount [] safety [] long distance driving [] operation of seat functions [] compliance with transportation regulations []                                             Rear wheel placement/Axle adjustability [] None [] semi adjustable [] fully adjustable  [] improved UE access to wheels [] improved stability [] changing angle in space for improvement of postural stability [] 1-arm drive access [] amputee pad placement []                                Wheel rims/ hand rims  [] metal   [] plastic coated [] oblique projections           [] vertical projections [] Provide ability to propel manual wheelchair  []  Increase self-propulsion with hand weakness/decreased grasp  Push handles [] extended   [] angle adjustable              [] standard [] caregiver access [] caregiver assist [] allows "hooking" to enable increased ability to perform ADLs or maintain balance  One armed device   [] Lt   [] Rt [] enable propulsion of manual wheelchair with one arm   []                                            Brake/wheel lock extension []  Lt   []  Rt [] increase indep in applying wheel locks   [] Side guards [] prevent clothing  getting caught in wheel or becoming soiled []  prevent skin tears/abrasions  Battery:   NF 22x2                                          [x] to power wheelchair                                                         Other:                                                                                                                         The above equipment has a life- long use expectancy. Growth and changes in medical and/or functional conditions would be the exceptions. This is to certify that the therapist has no financial relationship with durable medical provider or manufacturer. The therapist will not receive remuneration of any kind for the equipment recommended in this evaluation.   Patient has mobility limitation that significantly impairs safe, timely participation in one or more mobility related ADL's. (bathing, toileting, feeding, dressing, grooming, moving from room to room)  [x]  Yes []  No  Will mobility device sufficiently improve ability to participate and/or be aided in participation of MRADL's?      [x]  Yes []  No  Can limitation be compensated for with use of a cane or walker?                                    []  Yes [x]  No  Does patient or caregiver demonstrate ability/potential ability & willingness to safely use the mobility device?    [x]  Yes []  No  Does patient's home environment support use of recommended mobility device?            [x]  Yes []  No  Does patient have sufficient upper extremity function necessary to functionally propel a manual wheelchair?     []  Yes [x]  No  Does patient have sufficient strength and trunk stability to safely operate a POV (scooter)?                                  []  Yes [x]  No  Does patient need additional features/benefits provided by a power wheelchair for MRADL's in the home?        [x]  Yes []  No  Does the patient demonstrate the ability to safely use a power wheelchair?                   [x]  Yes []  No     Physician's Name Printed:  Physician's Signature:  Date:     This is to certify that I, the above signed therapist have the following affiliations: []  This DME provider []  Manufacturer of recommended equipment []  Patient's long term care facility [x]  None of the  above  Therapist Name/Signature:      Precious Bard, PT, DPT Physical Therapist - [facility]  Outpatient Physical Therapy- [facility] [phone]                                         Date: 09/13/22  ASSESSMENT:  CLINICAL IMPRESSION:  Patient is non ambulatory at baseline since prior to 2018, and only scoot transfers due to pain and limited mobility. He has significant ROM limitations of Ue's and pain with UE movement in addition to not being able to self propel in manual chair. He additionally does not have the trunk control and has significant back pain not allowing for use of scooter.    Patient is dependent on a power wheelchair for all mobility and this is the best option to continue maintaining his independence and ability to assist with ADL performance. He has significant pain in multiple joints, unable to tolerate standing, and has the knowledge and experience with utilizing a power chair.     The [place] HD power chair is the best option for this patient to optimize his mobility and independence with living.   He requires a heavy duty chair due to body habitus. A captain back and seat with provide postural support and improve trunk alignment. Adjustable height, flip back, full length pads will provide support for Ue's, and change heigh/angles for ADLS, flip back will assist for transfers as patient primarily uses scoot transfers at this time. A center mount flip up depth/angle adjustable foot plate will provide foot support when moving, and flip up to allow safe transfers. Flat free insert tires will decrease maintenance and frequent flats. A pelvic belt with decrease falling out of chair. He will require a proportional joystick on the R to provide access for controlling his wheelchair with a swing away mount for access and transfers. Two NF 22 batteries will be requires to power chair.  In conclusion patient will require a [employer] HD power chair to  promote functional safe mobility and independence.  OBJECTIVE IMPAIRMENTS: Abnormal gait, cardiopulmonary status limiting activity, decreased activity tolerance, decreased mobility, difficulty walking, decreased strength, impaired UE functional use, postural dysfunction, obesity, and pain.   ACTIVITY LIMITATIONS: carrying, lifting, bending, standing, squatting, sleeping, stairs, transfers, bed mobility, bathing, toileting, dressing, reach over head, hygiene/grooming, locomotion level, and caring for others  PARTICIPATION LIMITATIONS: meal prep, cleaning, laundry, medication management, personal finances, driving, shopping, and community activity  PERSONAL FACTORS: Age, Behavior pattern, Education, Fitness, Past/current experiences, Time since onset of injury/illness/exacerbation, and 3+ comorbidities:    are also affecting patient's functional outcome.     CLINICAL DECISION MAKING: Evolving/moderate complexity  EVALUATION COMPLEXITY: Moderate  GOALS: Target date:  .today     Pt and caregivers will understand PT recommendation and appropriate/safe use for wheelchair and seating for home use. Baseline: 5/21: MET Goal status: MET   PLAN:  PT FREQUENCY: one time visit     Precious Bard, PT 09/13/2022, 11:41 AM

## 2022-09-13 ENCOUNTER — Ambulatory Visit: Payer: 59 | Attending: Internal Medicine

## 2022-09-13 DIAGNOSIS — R262 Difficulty in walking, not elsewhere classified: Secondary | ICD-10-CM | POA: Diagnosis present

## 2022-10-25 DIAGNOSIS — Z993 Dependence on wheelchair: Secondary | ICD-10-CM | POA: Insufficient documentation

## 2022-11-30 ENCOUNTER — Other Ambulatory Visit: Payer: Self-pay | Admitting: Internal Medicine

## 2022-11-30 DIAGNOSIS — R7989 Other specified abnormal findings of blood chemistry: Secondary | ICD-10-CM

## 2022-12-05 ENCOUNTER — Ambulatory Visit
Admission: RE | Admit: 2022-12-05 | Discharge: 2022-12-05 | Disposition: A | Discharge status: Home / Self Care | Payer: 59 | Source: Ambulatory Visit | Attending: Internal Medicine | Admitting: Internal Medicine

## 2022-12-05 DIAGNOSIS — R7989 Other specified abnormal findings of blood chemistry: Secondary | ICD-10-CM | POA: Diagnosis not present

## 2022-12-20 ENCOUNTER — Other Ambulatory Visit: Payer: Self-pay

## 2022-12-20 ENCOUNTER — Emergency Department: Payer: 59

## 2022-12-20 ENCOUNTER — Emergency Department
Admission: EM | Admit: 2022-12-20 | Discharge: 2022-12-20 | Disposition: A | Discharge status: Home / Self Care | Payer: 59 | Attending: Emergency Medicine | Admitting: Emergency Medicine

## 2022-12-20 DIAGNOSIS — R234 Changes in skin texture: Secondary | ICD-10-CM | POA: Insufficient documentation

## 2022-12-20 DIAGNOSIS — R109 Unspecified abdominal pain: Secondary | ICD-10-CM | POA: Insufficient documentation

## 2022-12-20 DIAGNOSIS — R1900 Intra-abdominal and pelvic swelling, mass and lump, unspecified site: Secondary | ICD-10-CM | POA: Diagnosis not present

## 2022-12-20 LAB — CBC
HCT: 41.3 % (ref 39.0–52.0)
Hemoglobin: 13.9 g/dL (ref 13.0–17.0)
MCH: 31.3 pg (ref 26.0–34.0)
MCHC: 33.7 g/dL (ref 30.0–36.0)
MCV: 93 fL (ref 80.0–100.0)
Platelets: 142 10*3/uL — ABNORMAL LOW (ref 150–400)
RBC: 4.44 MIL/uL (ref 4.22–5.81)
RDW: 13.8 % (ref 11.5–15.5)
WBC: 5.5 10*3/uL (ref 4.0–10.5)
nRBC: 0 % (ref 0.0–0.2)

## 2022-12-20 LAB — COMPREHENSIVE METABOLIC PANEL
ALT: 30 U/L (ref 0–44)
AST: 34 U/L (ref 15–41)
Albumin: 3.9 g/dL (ref 3.5–5.0)
Alkaline Phosphatase: 50 U/L (ref 38–126)
Anion gap: 6 (ref 5–15)
BUN: 16 mg/dL (ref 8–23)
CO2: 33 mmol/L — ABNORMAL HIGH (ref 22–32)
Calcium: 9.8 mg/dL (ref 8.9–10.3)
Chloride: 103 mmol/L (ref 98–111)
Creatinine, Ser: 1.19 mg/dL (ref 0.61–1.24)
GFR, Estimated: >60 mL/min (ref >60)
Glucose, Bld: 90 mg/dL (ref 70–99)
Potassium: 3.5 mmol/L (ref 3.5–5.1)
Sodium: 142 mmol/L (ref 135–145)
Total Bilirubin: 0.5 mg/dL (ref 0.3–1.2)
Total Protein: 7.1 g/dL (ref 6.5–8.1)

## 2022-12-20 LAB — BRAIN NATRIURETIC PEPTIDE: B Natriuretic Peptide: 85.4 pg/mL (ref 0.0–100.0)

## 2022-12-20 LAB — URINALYSIS, ROUTINE W REFLEX MICROSCOPIC
Bilirubin Urine: NEGATIVE
Glucose, UA: NEGATIVE mg/dL
Hgb urine dipstick: NEGATIVE
Ketones, ur: NEGATIVE mg/dL
Leukocytes,Ua: NEGATIVE
Nitrite: NEGATIVE
Protein, ur: NEGATIVE mg/dL
Specific Gravity, Urine: 1.004 — ABNORMAL LOW (ref 1.005–1.030)
pH: 6 (ref 5.0–8.0)

## 2022-12-20 LAB — LIPASE, BLOOD: Lipase: 40 U/L (ref 11–51)

## 2022-12-20 MED ORDER — CEPHALEXIN 500 MG PO CAPS: 500.0000 mg | ORAL_CAPSULE | Freq: Four times a day (QID) | ORAL | 0 refills | Status: AC

## 2022-12-20 NOTE — ED Notes (Signed)
Pt to CT

## 2022-12-20 NOTE — ED Triage Notes (Signed)
Pt to ED for left sided abd pain, reports weight gain, unsure amount. Denies urinary sx.

## 2022-12-20 NOTE — ED Triage Notes (Signed)
First Nurse Note: Patient sent to ED from Dr [facility] office for increased abd distension. Sent for possible IV diuretics and further evaluation.

## 2022-12-20 NOTE — ED Provider Notes (Signed)
[facility] Provider Note    Event Date/Time   First MD Initiated Contact with Patient 12/20/22 1216     (approximate)   History   Abdominal Pain   HPI  Jay Sandoval is a 82 y.o. male presents to the emergency department from primary care doctor's office because of concern for couple month history of worsening abdominal swelling, discomfort.  The patient states that he feels it mostly in his lower abdomen.  This has been accompanied by some left sided abdominal pain.  Was going to his doctors today to have an evaluation for this.  Patient denies similar symptoms in the past.   Physical Exam   Triage Vital Signs: ED Triage Vitals  Encounter Vitals Group     BP 12/20/22 1058 (!) 116/59     Systolic BP Percentile --      Diastolic BP Percentile --      Pulse Rate 12/20/22 1058 71     Resp 12/20/22 1058 20     Temp 12/20/22 1058 98.8 F (37.1 C)     Temp src --      SpO2 12/20/22 1058 94 %     Weight 12/20/22 1057 (!) 400 lb (181.4 kg)     Height 12/20/22 1057 5\' 8"  (1.727 m)     Head Circumference --      Peak Flow --      Pain Score 12/20/22 1057 8     Pain Loc --      Pain Education --      Exclude from Growth Chart --     Most recent vital signs: Vitals:   12/20/22 1058  BP: (!) 116/59  Pulse: 71  Resp: 20  Temp: 98.8 F (37.1 C)  SpO2: 94%   General: Awake, alert, oriented. CV:  Good peripheral perfusion. Regular rate and rhythm. Resp:  Normal effort. Lungs clear. Abd:  Obese, no clear distention.    ED Results / Procedures / Treatments   Labs (all labs ordered are listed, but only abnormal results are displayed) Labs Reviewed  COMPREHENSIVE METABOLIC PANEL - Abnormal; Notable for the following components:      Result Value   CO2 33 (*)    All other components within normal limits  CBC - Abnormal; Notable for the following components:   Platelets 142 (*)    All other components within normal limits  LIPASE, BLOOD   URINALYSIS, ROUTINE W REFLEX MICROSCOPIC     EKG  None   RADIOLOGY I independently interpreted and visualized the CT ab/pel. My interpretation: no free air, no fluid Radiology interpretation:  IMPRESSION:  No bowel obstruction, free air or free fluid. Scattered stool.  Normal appendix.    Bilateral adrenal nodules. This has been present since at least 2010  by prior reports.    Portions of the anterior pelvic and abdominal wall not included in  the imaging field due to field-of-view. There is skin thickening and  subcutaneous fat stranding in the visualized portions of the  anterior abdominal and pelvic wall. Please correlate for any  clinical signs of infection or other process.    Patient is tilted in the scanner.       PROCEDURES:  Critical Care performed: No  MEDICATIONS ORDERED IN ED: Medications - No data to display   IMPRESSION / MDM / ASSESSMENT AND PLAN / ED COURSE  I reviewed the triage vital signs and the nursing notes.  Differential diagnosis includes, but is not limited to, diverticulitis, ascites, infection, SBO  Patient's presentation is most consistent with acute presentation with potential threat to life or bodily function.   The patient is on the cardiac monitor to evaluate for evidence of arrhythmia and/or significant heart rate changes.  Patient presented to the emergency department today from primary care doctor's office because of concerns for worsening abdominal pain and distention.  On exam patient without any significant abdominal tenderness.  Did obtain a CT scan which did not show any acute abnormalities save for some possible subcutaneous stranding concerning for possible cellulitis.  I discussed this with the patient.  Will plan on discharging her on antibiotics and discussed importance of primary care follow-up.      FINAL CLINICAL IMPRESSION(S) / ED DIAGNOSES   Final diagnoses:  Abdominal pain,  unspecified abdominal location     Note:  This document was prepared using Dragon voice recognition software and may include unintentional dictation errors.    Phineas Semen, MD 12/20/22 [phone]

## 2022-12-20 NOTE — Discharge Instructions (Signed)
Please seek medical attention for any high fevers, chest pain, shortness of breath, change in behavior, persistent vomiting, bloody stool or any other new or concerning symptoms.  

## 2023-01-04 ENCOUNTER — Observation Stay
Admission: EM | Admit: 2023-01-04 | Discharge: 2023-01-10 | Disposition: A | Discharge status: Skilled Nursing Facility | Payer: 59 | Attending: Student in an Organized Health Care Education/Training Program | Admitting: Student in an Organized Health Care Education/Training Program

## 2023-01-04 ENCOUNTER — Other Ambulatory Visit: Payer: Self-pay

## 2023-01-04 ENCOUNTER — Ambulatory Visit: Payer: 59 | Admitting: Urology

## 2023-01-04 DIAGNOSIS — J9611 Chronic respiratory failure with hypoxia: Secondary | ICD-10-CM | POA: Diagnosis present

## 2023-01-04 DIAGNOSIS — Z87891 Personal history of nicotine dependence: Secondary | ICD-10-CM | POA: Insufficient documentation

## 2023-01-04 DIAGNOSIS — K625 Hemorrhage of anus and rectum: Principal | ICD-10-CM | POA: Insufficient documentation

## 2023-01-04 DIAGNOSIS — K922 Gastrointestinal hemorrhage, unspecified: Secondary | ICD-10-CM | POA: Diagnosis not present

## 2023-01-04 DIAGNOSIS — N1832 Chronic kidney disease, stage 3b: Secondary | ICD-10-CM | POA: Diagnosis not present

## 2023-01-04 DIAGNOSIS — J45909 Unspecified asthma, uncomplicated: Secondary | ICD-10-CM | POA: Diagnosis not present

## 2023-01-04 DIAGNOSIS — Z7982 Long term (current) use of aspirin: Secondary | ICD-10-CM | POA: Insufficient documentation

## 2023-01-04 DIAGNOSIS — I10 Essential (primary) hypertension: Secondary | ICD-10-CM | POA: Diagnosis present

## 2023-01-04 DIAGNOSIS — J449 Chronic obstructive pulmonary disease, unspecified: Secondary | ICD-10-CM | POA: Diagnosis not present

## 2023-01-04 DIAGNOSIS — G4733 Obstructive sleep apnea (adult) (pediatric): Secondary | ICD-10-CM | POA: Insufficient documentation

## 2023-01-04 DIAGNOSIS — Z6841 Body Mass Index (BMI) 40.0 and over, adult: Secondary | ICD-10-CM | POA: Insufficient documentation

## 2023-01-04 DIAGNOSIS — R5381 Other malaise: Secondary | ICD-10-CM | POA: Diagnosis present

## 2023-01-04 DIAGNOSIS — G4730 Sleep apnea, unspecified: Secondary | ICD-10-CM | POA: Diagnosis present

## 2023-01-04 DIAGNOSIS — I509 Heart failure, unspecified: Secondary | ICD-10-CM | POA: Diagnosis not present

## 2023-01-04 DIAGNOSIS — J439 Emphysema, unspecified: Secondary | ICD-10-CM

## 2023-01-04 DIAGNOSIS — I5030 Unspecified diastolic (congestive) heart failure: Secondary | ICD-10-CM | POA: Insufficient documentation

## 2023-01-04 DIAGNOSIS — I890 Lymphedema, not elsewhere classified: Secondary | ICD-10-CM

## 2023-01-04 DIAGNOSIS — I130 Hypertensive heart and chronic kidney disease with heart failure and stage 1 through stage 4 chronic kidney disease, or unspecified chronic kidney disease: Secondary | ICD-10-CM | POA: Diagnosis not present

## 2023-01-04 HISTORY — DX: Sleep apnea, unspecified: G47.30

## 2023-01-04 LAB — BASIC METABOLIC PANEL
Anion gap: 11 (ref 5–15)
BUN: 14 mg/dL (ref 8–23)
CO2: 28 mmol/L (ref 22–32)
Calcium: 9.3 mg/dL (ref 8.9–10.3)
Chloride: 100 mmol/L (ref 98–111)
Creatinine, Ser: 1.04 mg/dL (ref 0.61–1.24)
GFR, Estimated: >60 mL/min (ref >60)
Glucose, Bld: 100 mg/dL — ABNORMAL HIGH (ref 70–99)
Potassium: 3.8 mmol/L (ref 3.5–5.1)
Sodium: 139 mmol/L (ref 135–145)

## 2023-01-04 LAB — PROTIME-INR
INR: 1 (ref 0.8–1.2)
Prothrombin Time: 13.6 s (ref 11.4–15.2)

## 2023-01-04 LAB — BRAIN NATRIURETIC PEPTIDE: B Natriuretic Peptide: 130.8 pg/mL — ABNORMAL HIGH (ref 0.0–100.0)

## 2023-01-04 LAB — TYPE AND SCREEN
ABO/RH(D): O POS
Antibody Screen: NEGATIVE

## 2023-01-04 LAB — CBC
HCT: 43.8 % (ref 39.0–52.0)
Hemoglobin: 14.3 g/dL (ref 13.0–17.0)
MCH: 31 pg (ref 26.0–34.0)
MCHC: 32.6 g/dL (ref 30.0–36.0)
MCV: 94.8 fL (ref 80.0–100.0)
Platelets: 127 10*3/uL — ABNORMAL LOW (ref 150–400)
RBC: 4.62 MIL/uL (ref 4.22–5.81)
RDW: 13.8 % (ref 11.5–15.5)
WBC: 4.3 10*3/uL (ref 4.0–10.5)
nRBC: 0 % (ref 0.0–0.2)

## 2023-01-04 LAB — HEPATIC FUNCTION PANEL
ALT: 35 U/L (ref 0–44)
AST: 41 U/L (ref 15–41)
Albumin: 3.9 g/dL (ref 3.5–5.0)
Alkaline Phosphatase: 49 U/L (ref 38–126)
Bilirubin, Direct: 0.2 mg/dL (ref 0.0–0.2)
Indirect Bilirubin: 0.8 mg/dL (ref 0.3–0.9)
Total Bilirubin: 1 mg/dL (ref 0.3–1.2)
Total Protein: 8 g/dL (ref 6.5–8.1)

## 2023-01-04 MED ORDER — SODIUM CHLORIDE 0.9% FLUSH
3.0000 mL | Freq: Two times a day (BID) | INTRAVENOUS | Status: DC
  Administered 2023-01-04 – 2023-01-08 (×9): 3 mL via INTRAVENOUS

## 2023-01-04 MED ORDER — AMLODIPINE BESYLATE 5 MG PO TABS
5.0000 mg | ORAL_TABLET | Freq: Every day | ORAL | Status: DC
  Administered 2023-01-05 – 2023-01-06 (×2): 5 mg via ORAL
  Filled 2023-01-04 (×2): qty 1

## 2023-01-04 MED ORDER — TORSEMIDE 20 MG PO TABS
40.0000 mg | ORAL_TABLET | Freq: Every day | ORAL | Status: DC
  Administered 2023-01-05 – 2023-01-10 (×6): 40 mg via ORAL
  Filled 2023-01-04 (×6): qty 2

## 2023-01-04 MED ORDER — POTASSIUM CHLORIDE CRYS ER 20 MEQ PO TBCR: 20.0000 meq | EXTENDED_RELEASE_TABLET | Freq: Every day | ORAL | Status: DC

## 2023-01-04 MED ORDER — ASPIRIN 81 MG PO TBEC
81.0000 mg | DELAYED_RELEASE_TABLET | Freq: Every day | ORAL | Status: DC
  Administered 2023-01-05 – 2023-01-10 (×6): 81 mg via ORAL
  Filled 2023-01-04 (×6): qty 1

## 2023-01-04 MED ORDER — ONDANSETRON HCL 4 MG/2ML IJ SOLN: 4.0000 mg | Freq: Four times a day (QID) | INTRAMUSCULAR | Status: DC | PRN

## 2023-01-04 MED ORDER — ACETAMINOPHEN 650 MG RE SUPP: 650.0000 mg | Freq: Four times a day (QID) | RECTAL | Status: DC | PRN

## 2023-01-04 MED ORDER — MOMETASONE FURO-FORMOTEROL FUM 200-5 MCG/ACT IN AERO
2.0000 | INHALATION_SPRAY | Freq: Two times a day (BID) | RESPIRATORY_TRACT | Status: DC
  Administered 2023-01-05 – 2023-01-10 (×10): 2 via RESPIRATORY_TRACT
  Filled 2023-01-04 (×2): qty 8.8

## 2023-01-04 MED ORDER — ALBUTEROL SULFATE (2.5 MG/3ML) 0.083% IN NEBU: 3.0000 mL | INHALATION_SOLUTION | Freq: Four times a day (QID) | RESPIRATORY_TRACT | Status: DC | PRN

## 2023-01-04 MED ORDER — BENAZEPRIL HCL 20 MG PO TABS
40.0000 mg | ORAL_TABLET | Freq: Every day | ORAL | Status: DC
  Administered 2023-01-05 – 2023-01-10 (×6): 40 mg via ORAL
  Filled 2023-01-04 (×6): qty 2

## 2023-01-04 MED ORDER — AMLODIPINE-ATORVASTATIN 5-10 MG PO TABS: 1.0000 | ORAL_TABLET | Freq: Every day | ORAL | Status: DC

## 2023-01-04 MED ORDER — ALLOPURINOL 100 MG PO TABS
200.0000 mg | ORAL_TABLET | Freq: Every day | ORAL | Status: DC
  Administered 2023-01-04 – 2023-01-10 (×7): 200 mg via ORAL
  Filled 2023-01-04 (×8): qty 2

## 2023-01-04 MED ORDER — ATENOLOL 50 MG PO TABS
50.0000 mg | ORAL_TABLET | Freq: Every day | ORAL | Status: DC
  Administered 2023-01-05 – 2023-01-10 (×6): 50 mg via ORAL
  Filled 2023-01-04 (×6): qty 1

## 2023-01-04 MED ORDER — ADULT MULTIVITAMIN W/MINERALS CH
1.0000 | ORAL_TABLET | Freq: Every day | ORAL | Status: DC
  Administered 2023-01-05 – 2023-01-10 (×6): 1 via ORAL
  Filled 2023-01-04 (×6): qty 1

## 2023-01-04 MED ORDER — SENNOSIDES-DOCUSATE SODIUM 8.6-50 MG PO TABS
2.0000 | ORAL_TABLET | Freq: Every day | ORAL | Status: DC
  Administered 2023-01-04 – 2023-01-06 (×3): 2 via ORAL
  Filled 2023-01-04 (×3): qty 2

## 2023-01-04 MED ORDER — DULOXETINE HCL 30 MG PO CPEP
60.0000 mg | ORAL_CAPSULE | Freq: Every day | ORAL | Status: DC
  Administered 2023-01-04 – 2023-01-10 (×7): 60 mg via ORAL
  Filled 2023-01-04 (×3): qty 2
  Filled 2023-01-04: qty 1
  Filled 2023-01-04 (×3): qty 2

## 2023-01-04 MED ORDER — CYCLOSPORINE 0.05 % OP EMUL
1.0000 [drp] | Freq: Two times a day (BID) | OPHTHALMIC | Status: DC
  Administered 2023-01-05 – 2023-01-10 (×12): 1 [drp] via OPHTHALMIC
  Filled 2023-01-04 (×12): qty 30

## 2023-01-04 MED ORDER — ACETAMINOPHEN 325 MG PO TABS: 650.0000 mg | ORAL_TABLET | Freq: Four times a day (QID) | ORAL | Status: DC | PRN

## 2023-01-04 MED ORDER — FUROSEMIDE 10 MG/ML IJ SOLN
60.0000 mg | Freq: Once | INTRAMUSCULAR | Status: AC
  Administered 2023-01-04: 60 mg via INTRAVENOUS
  Filled 2023-01-04: qty 8

## 2023-01-04 MED ORDER — HYDROCORTISONE 1 % EX CREA: 1.0000 | TOPICAL_CREAM | Freq: Three times a day (TID) | CUTANEOUS | Status: DC | PRN

## 2023-01-04 MED ORDER — VITAMIN B-6 100 MG PO TABS
100.0000 mg | ORAL_TABLET | Freq: Every day | ORAL | Status: DC
  Administered 2023-01-05 – 2023-01-10 (×7): 100 mg via ORAL
  Filled 2023-01-04 (×8): qty 1

## 2023-01-04 MED ORDER — TRAMADOL HCL 50 MG PO TABS
50.0000 mg | ORAL_TABLET | Freq: Two times a day (BID) | ORAL | Status: DC | PRN
  Administered 2023-01-04 – 2023-01-08 (×3): 50 mg via ORAL
  Filled 2023-01-04 (×3): qty 1

## 2023-01-04 MED ORDER — ONDANSETRON HCL 4 MG PO TABS: 4.0000 mg | ORAL_TABLET | Freq: Four times a day (QID) | ORAL | Status: DC | PRN

## 2023-01-04 MED ORDER — PREGABALIN 50 MG PO CAPS
100.0000 mg | ORAL_CAPSULE | Freq: Two times a day (BID) | ORAL | Status: DC
  Administered 2023-01-04 – 2023-01-10 (×13): 100 mg via ORAL
  Filled 2023-01-04 (×13): qty 2

## 2023-01-04 MED ORDER — LEVOTHYROXINE SODIUM 25 MCG PO TABS
25.0000 ug | ORAL_TABLET | Freq: Every day | ORAL | Status: DC
  Administered 2023-01-05 – 2023-01-10 (×6): 25 ug via ORAL
  Filled 2023-01-04 (×6): qty 1

## 2023-01-04 MED ORDER — ATORVASTATIN CALCIUM 20 MG PO TABS
10.0000 mg | ORAL_TABLET | Freq: Every day | ORAL | Status: DC
  Administered 2023-01-05 – 2023-01-10 (×6): 10 mg via ORAL
  Filled 2023-01-04 (×6): qty 1

## 2023-01-04 NOTE — ED Notes (Signed)
Unsuccessful lab attempt

## 2023-01-04 NOTE — Assessment & Plan Note (Signed)
Patient has a history of morbid obesity with a BMI of 60.7.  Complicated by OSA, HFpEF, hypertension.  Additionally, patient is wheelchair-bound secondary to weight related complications.

## 2023-01-04 NOTE — ED Notes (Signed)
Pts bedside hemoccult stool test was positive.

## 2023-01-04 NOTE — Progress Notes (Signed)
       CROSS COVER NOTE  NAME: Jay Sandoval MRN: [id number] DOB : Sep 10, 1940    Concern as stated by nurse / staff    Can pt have something for his chronic back pain its 7/10. He says he takes tramadol at home.     Pertinent findings on chart review: H & P, prior meds reviewed, not reconciled  Assessment and  Interventions   Assessment:    01/04/2023    9:19 PM 01/04/2023    9:02 PM 01/04/2023    8:30 PM  Vitals with BMI  Systolic  152 160  Diastolic  67 65  Pulse 63 63 61    Plan: Ultram 50 BID prn for chronic back pain ordered       Donnie Mesa NP [facility] Cross Cover 7pm-7am - check amion for availability Pager [phone]

## 2023-01-04 NOTE — H&P (Addendum)
History and Physical    Patient: Jay Sandoval VQQ:[id number] DOB: 1941/02/10 DOA: 01/04/2023 DOS: the patient was seen and examined on 01/04/2023 PCP: Barbette Reichmann, MD  Patient coming from: Home  Chief Complaint: Rectal Bleeding  HPI: GLYNN NGAI is a 82 y.o. male with medical history significant of diverticulosis, HFpEF, OSA on CPAP, COPD, chronic hypoxic respiratory failure on 3 L supplemental oxygen at nighttime and with exertion, CKD stage IIIb, lymphedema, morbid obesity with BMI of 60 with wheelchair dependence, hypothyroidism, hypertension, who presents to the ED due to rectal bleeding  Mr. Burek states that he went to use the restroom this morning and when he looked down into the toilet bowl, he noticed that there was red blood clots mixed into his stool and free-floating in the bowl.  He denies any abdominal pain but feels that his abdomen is getting heavier and "dropping lower."  He endorses gradually increasing lower extremity swelling for the last 1 month but denies any shortness of breath, chest pain or palpitations.  He denies any urinary symptoms.  He denies any nausea, vomiting or melena.  ED course: On arrival to the ED, patient was normotensive at 123/65 with heart rate of 65.  He was saturating at 93% on room air.  He was afebrile 97.9. Initial workup demonstrates normal hemoglobin at 14.3, platelets of 127, potassium 3.8, glucose 100, creatinine 1.04 with GFR above 60.  Due to suspected diverticular bleed, [facility] contacted for admission.  Review of Systems: As mentioned in the history of present illness. All other systems reviewed and are negative.  Past Medical History:  Diagnosis Date   Arthritis    Asthma    Hypertension    Osteoporosis    Past Surgical History:  Procedure Laterality Date   COLONOSCOPY N/A 07/10/2020   Procedure: COLONOSCOPY;  Surgeon: Pasty Spillers, MD;  Location: [facility] ENDOSCOPY;  Service: Endoscopy;  Laterality: N/A;   KNEE  ARTHROPLASTY     SHOULDER ARTHROSCOPY DISTAL CLAVICLE EXCISION AND OPEN ROTATOR CUFF REPAIR     Social History:  reports that he quit smoking about 7 years ago. His smoking use included cigarettes. He has never used smokeless tobacco. He reports that he does not drink alcohol and does not use drugs.  No Known Allergies  History reviewed. No pertinent family history.  Prior to Admission medications   Medication Sig Start Date End Date Taking? Authorizing Provider  albuterol (PROVENTIL) (2.5 MG/3ML) 0.083% nebulizer solution Take 2.5 mg by nebulization every 6 (six) hours as needed. 03/04/16   [provider]  albuterol (VENTOLIN HFA) 108 (90 Base) MCG/ACT inhaler Inhale 2 puffs into the lungs every 6 (six) hours as needed for wheezing or shortness of breath.    [provider]  allopurinol (ZYLOPRIM) 100 MG tablet Take 200 mg by mouth daily. 05/27/14   [provider]  amLODipine-atorvastatin (CADUET) 5-10 MG tablet Take 1 tablet by mouth daily. 02/28/16   [provider]  ascorbic acid (VITAMIN C) 500 MG tablet Take 500 mg by mouth daily.    [provider]  aspirin EC 81 MG tablet Take 81 mg by mouth daily. Swallow whole.    [provider]  atenolol (TENORMIN) 50 MG tablet Take 50 mg by mouth daily. 02/15/16   [provider]  benazepril (LOTENSIN) 20 MG tablet Take 20 mg by mouth daily. 02/25/16   [provider]  clotrimazole-betamethasone (LOTRISONE) cream Apply 1 application topically 2 (two) times daily. 04/19/18   [provider]  Coenzyme Q10 100 MG capsule Take 100 mg by mouth 2 (two) times daily.    [provider]  dicyclomine (BENTYL) 10 MG capsule Take 10 mg by mouth daily. 02/22/18   [provider]  DULoxetine (CYMBALTA) 60 MG capsule Take 60 mg by mouth daily.    [provider]  fluticasone (FLONASE) 50 MCG/ACT nasal spray Place 2 sprays into both nostrils daily as needed.     [provider]  fluticasone (FLONASE) 50 MCG/ACT nasal spray Place 2 sprays into the nose daily. 08/03/21   [provider]  gabapentin (NEURONTIN) 100 MG capsule Take 100 mg by mouth 3 (three) times daily. Patient is unsure of dosage.    [provider]  hydrALAZINE (APRESOLINE) 50 MG tablet Take 50 mg by mouth daily as needed.    [provider]  levothyroxine (SYNTHROID) 25 MCG tablet TAKE 1 TABLET BY MOUTH ONCE DAILY ON AN EMPTY STOMACH WITH A GLASS OF WATER AT LEAST 30 60 MINUTES BEFORE BREAKFAST 10/13/18   [provider]  metolazone (ZAROXOLYN) 2.5 MG tablet Take 2.5 mg by mouth every 7 (seven) days.    [provider]  Multiple Vitamins-Minerals (MULTIVITAL PO) Take 1 tablet by mouth daily.    [provider]  nystatin cream (MYCOSTATIN) Apply 1 application topically daily as needed. 02/25/16   [provider]  omega-3 fish oil (MAXEPA) 1000 MG CAPS capsule Take 2 capsules by mouth 2 (two) times daily.    [provider]  omeprazole (PRILOSEC) 20 MG capsule Take 20 mg by mouth daily.    [provider]  potassium chloride SA (K-DUR) 20 MEQ tablet Take 20 mEq by mouth daily. 09/06/18   [provider]  pregabalin (LYRICA) 75 MG capsule Take 75 mg by mouth 2 (two) times daily. Patient not taking: Reported on 11/23/2021 12/06/18   [provider]  pyridOXINE (VITAMIN B-6) 100 MG tablet Take 100 mg by mouth daily.    [provider]  RESTASIS 0.05 % ophthalmic emulsion Place 1 drop into both eyes 2 (two) times daily. 02/25/16   [provider]  senna-docusate (SENOKOT-S) 8.6-50 MG tablet Take 2 tablets by mouth at bedtime.    [provider]  terazosin (HYTRIN) 2 MG capsule Take 20 mg by mouth daily.    [provider]  tiotropium (SPIRIVA) 18 MCG inhalation capsule Place 18 mcg into inhaler and inhale daily.    [provider]  tiZANidine  (ZANAFLEX) 4 MG tablet Take 4 mg by mouth 2 (two) times daily as needed. 03/21/16   [provider]  torsemide (DEMADEX) 20 MG tablet Take 20 mg by mouth daily.    [provider]  traMADol (ULTRAM) 50 MG tablet Take 50 mg by mouth 2 (two) times daily as needed. 11/09/21   [provider]    Physical Exam: Vitals:   01/04/23 1209 01/04/23 1210 01/04/23 1300 01/04/23 1500  BP:  (!) 146/74 123/65 (!) 143/82  Pulse:  69 65 61  Resp:  18  20  Temp:  97.9 F (36.6 C)    SpO2:  94% 93% 95%  Weight: (!) 181 kg     Height: 5\' 8"  (1.727 m)      Physical Exam Vitals and nursing note reviewed.  Constitutional:      Appearance: He is morbidly obese. He is not ill-appearing or toxic-appearing.  HENT:     Head: Normocephalic and atraumatic.  Mouth/Throat:     Mouth: Mucous membranes are moist.     Pharynx: Oropharynx is clear.  Eyes:     Conjunctiva/sclera: Conjunctivae normal.     Pupils: Pupils are equal, round, and reactive to light.  Cardiovascular:     Rate and Rhythm: Normal rate and regular rhythm.     Heart sounds: No murmur heard.    No gallop.     Comments: +1 pitting edema extending up to the pannus bilaterally Pulmonary:     Effort: Pulmonary effort is normal. No respiratory distress.     Breath sounds: Decreased breath sounds (throughout due to body habitus) present.  Abdominal:     General: Bowel sounds are normal.     Palpations: Abdomen is soft.     Tenderness: There is no abdominal tenderness.     Comments: Large pannus with pitting edema.  No area of erythema to suggest cellulitis or intertrigo.  Skin:    General: Skin is warm and dry.  Neurological:     Mental Status: He is alert and oriented to person, place, and time. Mental status is at baseline.  Psychiatric:        Mood and Affect: Mood normal.        Behavior: Behavior normal.    Data Reviewed: CBC with WBC of 4.3, hemoglobin of 14.3, platelets of 127 BMP with sodium of 139,  potassium 3.8, bicarb 28, creatinine 1.04 with BUN of 14 and GFR above 60 INR 1.0  Hepatic function panel pending BNP pending  Results are pending, will review when available.  Assessment and Plan:  * Lower GI bleed Patient is presenting with one episode of red rectal bleeding that occurred today.  Given no significant abdominal pain, likely diverticular bleed.  Previous colonoscopy in March 2022 demonstrated resolved diverticular bleed.  - No indication for GI consultation at this time - If brisk bleeding should occur or evidence of hemodynamic instability, will consider CTA - CBC in the a.m. - Hold home aspirin for now  Congestive heart failure [facility]) Patient has a history of HFpEF with last echocardiogram in 2017 demonstrating EF above 55%.  Myoview around the same time demonstrated small defect in the apical anterior location consistent with prior MI.  There is no evidence of pulmonary edema, however peripheral edema is present.  Will give a one-time dose of IV Lasix and increase home diuretic dose.  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  - Continue home 3 L at bedtime and with exertion  COPD (chronic obstructive pulmonary disease) (HCC) - Continue home bronchodilators  Sleep apnea - CPAP at bedtime  Morbid obesity (HCC) Patient has a history of morbid obesity with a BMI of 60.7.  Complicated by OSA, HFpEF, hypertension.  Additionally, patient is wheelchair-bound secondary to weight related complications.  Hypertension - Resume home antihypertensives tomorrow  Lymphedema -  SCDs ordered  Advance Care Planning:   Code Status: Full Code verified by patient  Consults: None  Family Communication: No family at bedside  Severity of Illness: The appropriate patient status for this patient is OBSERVATION. Observation status is  judged to be reasonable and necessary in order to provide the required intensity of service to ensure the patient's safety. The patient's presenting symptoms, physical exam findings, and initial radiographic and laboratory data in the context of their medical condition is felt to place them at decreased risk for further clinical deterioration. Furthermore, it is anticipated that the patient will be medically stable for discharge from the hospital within 2 midnights of admission.   Author: Verdene Lennert, MD 01/04/2023 3:51 PM  For on call review [email or web address].

## 2023-01-04 NOTE — Assessment & Plan Note (Signed)
SCDs ordered 

## 2023-01-04 NOTE — Assessment & Plan Note (Signed)
-   CPAP at bedtime 

## 2023-01-04 NOTE — ED Notes (Signed)
This rn tried twice to start IV on pt. Not successful.

## 2023-01-04 NOTE — ED Notes (Signed)
Patient incontinent of urine; patient was cleaned, chuck pads placed and new sheet applied

## 2023-01-04 NOTE — Assessment & Plan Note (Signed)
- 

## 2023-01-04 NOTE — ED Provider Notes (Signed)
[facility] Provider Note    Event Date/Time   First MD Initiated Contact with Patient 01/04/23 1255     (approximate)   History   No chief complaint on file.   HPI  Jay Sandoval is a 82 y.o. male here with rectal bleeding.  The patient states that over the last day, he has had increasing soft bowel movements.  He had 1 this morning and noticed that it was grossly bloody with dark clots in the toilet bowl.  He states that he has a history of diverticulosis with similar symptoms.  He has had some increased swelling of his abdominal wall recently but no overt abdominal pain.  No fevers or chills.  Denies blood thinner use.  He does feel generally fatigued.  No chest pain.  No urinary symptoms.     Physical Exam   Triage Vital Signs: ED Triage Vitals  Encounter Vitals Group     BP 01/04/23 1210 (!) 146/74     Systolic BP Percentile --      Diastolic BP Percentile --      Pulse Rate 01/04/23 1210 69     Resp 01/04/23 1210 18     Temp 01/04/23 1210 97.9 F (36.6 C)     Temp src --      SpO2 01/04/23 1210 94 %     Weight 01/04/23 1209 (!) 399 lb 0.5 oz (181 kg)     Height 01/04/23 1209 5\' 8"  (1.727 m)     Head Circumference --      Peak Flow --      Pain Score 01/04/23 1208 8     Pain Loc --      Pain Education --      Exclude from Growth Chart --     Most recent vital signs: Vitals:   01/04/23 1700 01/04/23 1830  BP: (!) 183/171 (!) 156/70  Pulse: 68 66  Resp:    Temp:    SpO2: 90% 98%     General: Awake, no distress.  CV:  Good peripheral perfusion.  Regular rate and rhythm. Resp:  Normal work of breathing.  Lungs clear to auscultation bilaterally. Abd:  No distention.  Mild edema of lower abdominal wall, marked obesity limiting exam.  No overt rectal bleeding. Other:  Well-appearing in no distress   ED Results / Procedures / Treatments   Labs (all labs ordered are listed, but only abnormal results are displayed) Labs Reviewed   CBC - Abnormal; Notable for the following components:      Result Value   Platelets 127 (*)    All other components within normal limits  BASIC METABOLIC PANEL - Abnormal; Notable for the following components:   Glucose, Bld 100 (*)    All other components within normal limits  BRAIN NATRIURETIC PEPTIDE - Abnormal; Notable for the following components:   B Natriuretic Peptide 130.8 (*)    All other components within normal limits  HEPATIC FUNCTION PANEL  PROTIME-INR  BASIC METABOLIC PANEL  CBC  PROTIME-INR  MAGNESIUM  TYPE AND SCREEN     EKG    RADIOLOGY Reviewed prior CTs as well as colonoscopies which showed sigmoid and colonic diverticulosis   I also independently reviewed and agree with radiologist interpretations.   PROCEDURES:  Critical Care performed: No  MEDICATIONS ORDERED IN ED: Medications  sodium chloride flush (NS) 0.9 % injection 3 mL (3 mLs Intravenous Given 01/04/23 1623)  acetaminophen (TYLENOL) tablet 650 mg (has  no administration in time range)    Or  acetaminophen (TYLENOL) suppository 650 mg (has no administration in time range)  ondansetron (ZOFRAN) tablet 4 mg (has no administration in time range)    Or  ondansetron (ZOFRAN) injection 4 mg (has no administration in time range)  multivitamin with minerals tablet 1 tablet (has no administration in time range)  aspirin EC tablet 81 mg (has no administration in time range)  albuterol (PROVENTIL) (2.5 MG/3ML) 0.083% nebulizer solution 3 mL (has no administration in time range)  allopurinol (ZYLOPRIM) tablet 200 mg (has no administration in time range)  atenolol (TENORMIN) tablet 50 mg (has no administration in time range)  benazepril (LOTENSIN) tablet 40 mg (has no administration in time range)  DULoxetine (CYMBALTA) DR capsule 60 mg (has no administration in time range)  levothyroxine (SYNTHROID) tablet 25 mcg (has no administration in time range)  pregabalin (LYRICA) capsule 100 mg (has no  administration in time range)  pyridOXINE (VITAMIN B6) tablet 100 mg (has no administration in time range)  cycloSPORINE (RESTASIS) 0.05 % ophthalmic emulsion 1 drop (has no administration in time range)  senna-docusate (Senokot-S) tablet 2 tablet (has no administration in time range)  mometasone-formoterol (DULERA) 200-5 MCG/ACT inhaler 2 puff (has no administration in time range)  torsemide (DEMADEX) tablet 40 mg (has no administration in time range)  potassium chloride SA (KLOR-CON M) CR tablet 20 mEq (has no administration in time range)  amLODipine (NORVASC) tablet 5 mg (has no administration in time range)  atorvastatin (LIPITOR) tablet 10 mg (has no administration in time range)  furosemide (LASIX) injection 60 mg (60 mg Intravenous Given 01/04/23 1621)     IMPRESSION / MDM / ASSESSMENT AND PLAN / ED COURSE  I reviewed the triage vital signs and the nursing notes.                              Differential diagnosis includes, but is not limited to, diverticulosis, hemorrhoids, AVM, UGIB, mesenteric ischemia  Patient's presentation is most consistent with acute presentation with potential threat to life or bodily function.  The patient is on the cardiac monitor to evaluate for evidence of arrhythmia and/or significant heart rate changes  82 yo M here with BRBPR. Pt has h/o diverticulosis per my review of his prior colonoscopies, and reports grossly bloody stools with dark clots - suspect diverticular bleed. He is HDS and in NAD.  CBC shows no leukocytosis or anemia. BMP with normal electrolytes. BNP elevated, and pt has ansarca on exam - likely would benefit form diuresis. Will admit for active diverticular bleed and anasarca.   FINAL CLINICAL IMPRESSION(S) / ED DIAGNOSES   Final diagnoses:  Lower GI bleed     Rx / DC Orders   ED Discharge Orders     None        Note:  This document was prepared using Dragon voice recognition software and may include unintentional  dictation errors.   Shaune Pollack, MD 01/04/23 1945

## 2023-01-04 NOTE — ED Triage Notes (Signed)
Pt to ED [facility] from home for continued abd pain and rectal bleeding started today. Recently discharged for same. Denies n/v/d

## 2023-01-04 NOTE — Assessment & Plan Note (Addendum)
Patient has a history of HFpEF with last echocardiogram in 2017 demonstrating EF above 55%.  Myoview around the same time demonstrated small defect in the apical anterior location consistent with prior MI.  There is no evidence of pulmonary edema, however peripheral edema is present.  Will give a one-time dose of IV Lasix and increase home diuretic dose.  I suspect this is not a acute exacerbation but rather a need for increased diuresis at baseline  - Lasix 60 mg IV once - Restart home torsemide and increased dose of 40 mg daily tomorrow - Continue home metolazone once weekly - Daily weights - Strict in and out - Continue outpatient follow-up w/ cardiology

## 2023-01-04 NOTE — ED Notes (Signed)
Patient cleaned of urinary and bowel incontinence; sheet changed and chucks pads replaced

## 2023-01-04 NOTE — Assessment & Plan Note (Signed)
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  - Continue home 3 L at bedtime and with exertion

## 2023-01-04 NOTE — Assessment & Plan Note (Addendum)
Patient is presenting with one episode of red rectal bleeding that occurred today.  Given no significant abdominal pain, likely diverticular bleed.  Previous colonoscopy in March 2022 demonstrated resolved diverticular bleed.  - No indication for GI consultation at this time - If brisk bleeding should occur or evidence of hemodynamic instability, will consider CTA - CBC in the a.m. - Hold home aspirin for now

## 2023-01-04 NOTE — Assessment & Plan Note (Signed)
-   Resume home antihypertensives tomorrow 

## 2023-01-05 DIAGNOSIS — K625 Hemorrhage of anus and rectum: Secondary | ICD-10-CM | POA: Diagnosis not present

## 2023-01-05 DIAGNOSIS — K922 Gastrointestinal hemorrhage, unspecified: Secondary | ICD-10-CM | POA: Diagnosis not present

## 2023-01-05 LAB — CBC
HCT: 43.1 % (ref 39.0–52.0)
Hemoglobin: 14.5 g/dL (ref 13.0–17.0)
MCH: 31.3 pg (ref 26.0–34.0)
MCHC: 33.6 g/dL (ref 30.0–36.0)
MCV: 93.1 fL (ref 80.0–100.0)
Platelets: 128 10*3/uL — ABNORMAL LOW (ref 150–400)
RBC: 4.63 MIL/uL (ref 4.22–5.81)
RDW: 13.4 % (ref 11.5–15.5)
WBC: 5.2 10*3/uL (ref 4.0–10.5)
nRBC: 0 % (ref 0.0–0.2)

## 2023-01-05 LAB — MAGNESIUM: Magnesium: 2 mg/dL (ref 1.7–2.4)

## 2023-01-05 LAB — PROTIME-INR
INR: 1 (ref 0.8–1.2)
Prothrombin Time: 13.8 s (ref 11.4–15.2)

## 2023-01-05 LAB — BASIC METABOLIC PANEL
Anion gap: 13 (ref 5–15)
BUN: 14 mg/dL (ref 8–23)
CO2: 31 mmol/L (ref 22–32)
Calcium: 9.5 mg/dL (ref 8.9–10.3)
Chloride: 94 mmol/L — ABNORMAL LOW (ref 98–111)
Creatinine, Ser: 1.09 mg/dL (ref 0.61–1.24)
GFR, Estimated: >60 mL/min (ref >60)
Glucose, Bld: 91 mg/dL (ref 70–99)
Potassium: 3 mmol/L — ABNORMAL LOW (ref 3.5–5.1)
Sodium: 138 mmol/L (ref 135–145)

## 2023-01-05 MED ORDER — ENSURE MAX PROTEIN PO LIQD
11.0000 [oz_av] | Freq: Two times a day (BID) | ORAL | Status: DC
  Administered 2023-01-05 – 2023-01-10 (×12): 11 [oz_av] via ORAL
  Filled 2023-01-05: qty 330

## 2023-01-05 MED ORDER — POTASSIUM CHLORIDE CRYS ER 20 MEQ PO TBCR
40.0000 meq | EXTENDED_RELEASE_TABLET | Freq: Two times a day (BID) | ORAL | Status: AC
  Administered 2023-01-05 (×2): 40 meq via ORAL
  Filled 2023-01-05 (×2): qty 2

## 2023-01-05 MED ORDER — DICLOFENAC SODIUM 1 % EX GEL
2.0000 g | Freq: Four times a day (QID) | CUTANEOUS | Status: DC
  Administered 2023-01-05 – 2023-01-10 (×23): 2 g via TOPICAL
  Filled 2023-01-05 (×2): qty 100

## 2023-01-05 NOTE — Care Management Obs Status (Signed)
MEDICARE OBSERVATION STATUS NOTIFICATION   Patient Details  Name: Jay Sandoval MRN: [id number] Date of Birth: [birth year]   Medicare Observation Status Notification Given:  Yes    Carlia Bomkamp, LCSW 01/05/2023, 2:03 PM

## 2023-01-05 NOTE — TOC Initial Note (Signed)
Transition of Care [facility]) - Initial/Assessment Note    Patient Details  Name: Jay Sandoval MRN: [id number] Date of Birth: 04/22/1941  Transition of Care [facility]) CM/SW Contact:    Allena Katz, LCSW Phone Number: 01/05/2023, 1:47 PM  Clinical Narrative:   CSW met with pt at bedside. Pt reports he lives at home alone and is wheelchair level at baseline. Pt sates he used to be able to transfer himself but is not able to do this any longer and would like to go to rehab. Pt active with his PCP Dr. Marcello Fennel. Pt reports that he uses a wheelchair van for his transportation. Pt reports no preference in SNF but does want it to be [place].                 Expected Discharge Plan: Skilled Nursing Facility Barriers to Discharge: Continued Medical Work up   Patient Goals and CMS Choice Patient states their goals for this hospitalization and ongoing recovery are:: go to rehab CMS Medicare.gov Compare Post Acute Care list provided to:: Patient Choice offered to / list presented to : Patient      Expected Discharge Plan and Services                                              Prior Living Arrangements/Services   Lives with:: Self   Do you feel safe going back to the place where you live?: Yes      Need for Family Participation in Patient Care: Yes (Comment)   Current home services: DME    Activities of Daily Living Home Assistive Devices/Equipment: Wheelchair, Oxygen, CPAP ADL Screening (condition at time of admission) Patient's cognitive ability adequate to safely complete daily activities?: Yes Is the patient deaf or have difficulty hearing?: No Does the patient have difficulty seeing, even when wearing glasses/contacts?: No Does the patient have difficulty concentrating, remembering, or making decisions?: No Patient able to express need for assistance with ADLs?: Yes Does the patient have difficulty dressing or bathing?: Yes Independently performs ADLs?:  No Communication: Independent Dressing (OT): Needs assistance, Dependent Is this a change from baseline?: Pre-admission baseline Grooming: Needs assistance, Dependent Is this a change from baseline?: Pre-admission baseline Feeding: Independent Bathing: Needs assistance, Dependent Is this a change from baseline?: Pre-admission baseline Toileting: Needs assistance, Dependent Is this a change from baseline?: Pre-admission baseline In/Out Bed: Dependent Is this a change from baseline?: Pre-admission baseline Walks in Home: Independent with device (comment) (unable to walk, uses power w/chair) Does the patient have difficulty walking or climbing stairs?: Yes Weakness of Legs: Both Weakness of Arms/Hands: None  Permission Sought/Granted Permission sought to share information with : Case Manager, Magazine features editor                Emotional Assessment Appearance:: Appears stated age     Orientation: : Oriented to Self, Oriented to Place, Oriented to  Time, Oriented to Situation      Admission diagnosis:  Acute lower GI bleeding [K92.2] Lower GI bleed [K92.2] Patient Active Problem List   Diagnosis Date Noted   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   Ambulatory dysfunction 11/12/2020   Rectal bleeding    Polyp of colon    Lower GI bleed 07/09/2020   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   Polyarthralgia 11/12/2018   Primary osteoarthritis involving multiple joints  11/12/2018   Chronic respiratory failure with hypoxia (HCC) 04/13/2018   Asthma without status asthmaticus 09/07/2017   Chronic lower back pain 09/07/2017   COPD (chronic obstructive pulmonary disease) (HCC) 09/07/2017   GERD (gastroesophageal reflux disease) 09/07/2017   Hyperlipidemia, unspecified 09/07/2017   Psoriasis 09/07/2017   Sleep apnea 09/07/2017   Hypertension 09/07/2017   Chronic pain of both knees (Primary Area of Pain)(R>L) 09/07/2017   Chronic pain of both  shoulders  (Secondary Area of Pain)(R>L) 09/07/2017   Chronic bilateral low back pain without sciatica (Tertiary Area of Pain)(R>L) 09/07/2017   Wrist pain, chronic, left (Fourth Area of Pain) 09/07/2017   Chronic hand pain, left 09/07/2017   Chronic pain syndrome 09/07/2017   Long term current use of opiate analgesic 09/07/2017   Pharmacologic therapy 09/07/2017   Disorder of skeletal system 09/07/2017   Problems influencing health status 09/07/2017   Morbid obesity (HCC) 03/22/2016   Swelling of limb 03/22/2016   Lymphedema 03/22/2016   Preop cardiovascular exam 07/07/2015   Chest pain with high risk for cardiac etiology 06/23/2015   SOB (shortness of breath) on exertion 06/23/2015   Dependence on continuous positive airway pressure ventilation 06/17/2015   Congestive heart failure (HCC) 03/11/2015   Venous stasis 03/11/2015   Asthma 03/11/2015   Benign hypertension 03/11/2015   Hypercholesterolemia 03/11/2015   Obstructive sleep apnea syndrome 03/11/2015   Back muscle spasm 02/26/2015   DDD (degenerative disc disease), lumbar 10/20/2014   Osteoarthritis of knees, bilateral 11/19/2013   PCP:  Barbette Reichmann, MD Pharmacy:   [facility] [address] ([address]), [place] - [address] [place] ([place]) [place] [address] Phone: [phone] Fax: [phone]  [facility] [address], [place] - [address] [place] [address] Phone: [phone] Fax: [phone]     Social Determinants of Health (SDOH) Social History: SDOH Screenings   Food Insecurity: Unknown (01/04/2023)  Housing: Low Risk  (01/04/2023)  Transportation Needs: No Transportation Needs (01/04/2023)  Utilities: Not At Risk (01/04/2023)  Financial Resource Strain: Low Risk  (12/20/2022)   Received from [facility]  Social Connections: Unknown (09/07/2021)   Received from [facility]  Tobacco Use: Medium Risk  (01/04/2023)   SDOH Interventions:     Readmission Risk Interventions     No data to display

## 2023-01-05 NOTE — Evaluation (Addendum)
Occupational Therapy Evaluation Patient Details Name: Jay Sandoval MRN: [id number] DOB: 03-14-1941 Today's Date: 01/05/2023   History of Present Illness 82 y.o. male with medical history significant of diverticulosis, HFpEF, OSA on CPAP, COPD, chronic hypoxic respiratory failure on 3 L supplemental oxygen at nighttime and with exertion, CKD stage IIIb, lymphedema, morbid obesity with BMI of 60 with wheelchair dependence, hypothyroidism, hypertension, who presents to the ED due to rectal bleeding   Clinical Impression   Pt was seen for OT evaluation this date. Prior to hospital admission, pt was limited with all aspects of mobility and ADL tasks. Pt lives alone but has an aide who assists daily with ADL tasks. Pt presents to acute OT demonstrating impaired ADL performance and functional mobility 2/2 decreased strength, balance, and activity tolerance (See OT problem list for additional functional deficits). Pt currently requires MAX A +1-2 assist for bed mobility, MIN A +2 for safety with ADL transfers from elevated bed with bariatric RW. Pt requires MAX A for LB ADL, TOTAL A for toileting, and MOD A for seated UB ADL tasks. Setup for deodorant and teeth brushing. Pt educated in benefits of increasing active participation in ADL to support improved strength activity tolerance as well as maintaining level of independence. Pt verbalized understanding.  Pt would benefit from skilled OT services to address noted impairments and functional limitations (see below for any additional details) in order to maximize safety and independence while minimizing falls risk and caregiver burden.     If plan is discharge home, recommend the following: Two people to help with walking and/or transfers;A lot of help with bathing/dressing/bathroom;Assistance with cooking/housework;Help with stairs or ramp for entrance;Assist for transportation    Functional Status Assessment  Patient has had a recent decline in their  functional status and demonstrates the ability to make significant improvements in function in a reasonable and predictable amount of time.  Equipment Recommendations  Other (comment) (defer to next venue)    Recommendations for Other Services       Precautions / Restrictions Precautions Precautions: Fall Restrictions Weight Bearing Restrictions: No      Mobility Bed Mobility Overal bed mobility: Needs Assistance Bed Mobility: Supine to Sit, Sit to Supine     Supine to sit: Max assist Sit to supine: Max assist, +2 for physical assistance   General bed mobility comments: Pt struggled with transitions to/from sitting.  Heavy assist with trunk and legs.  Good effort with limited function    Transfers Overall transfer level: Needs assistance Equipment used: Rolling walker (2 wheels) Transfers: Sit to/from Stand Sit to Stand: From elevated surface, Min assist           General transfer comment: Unable to rise with bed raised ~2", at 4-5" of elevation he was able to shift forward and up (never fully upright posture) leaning heavily on walker      Balance Overall balance assessment: Needs assistance Sitting-balance support: Bilateral upper extremity supported Sitting balance-Leahy Scale: Good     Standing balance support: Bilateral upper extremity supported Standing balance-Leahy Scale: Poor Standing balance comment: highly reliant on walker with heavy forward lean, poor tolerance                           ADL either performed or assessed with clinical judgement   ADL Overall ADL's : Needs assistance/impaired  General ADL Comments: Pt currently requires MOD A for grooming, UB ADL and MAX A for all LB ADL and TOTAL A for toileting all from bed level or seated EOB.     Vision         Perception         Praxis         Pertinent Vitals/Pain Pain Assessment Pain Assessment: 0-10 Pain Score: 8   Pain Location: b/l shoulders, L>R knee, low back - acute on chronic "everywhere" Pain Descriptors / Indicators: Aching Pain Intervention(s): Monitored during session, Repositioned     Extremity/Trunk Assessment Upper Extremity Assessment Upper Extremity Assessment: Generalized weakness (unable to raise either shoulder >90, functional tricep and grip strength)   Lower Extremity Assessment Lower Extremity Assessment: Generalized weakness (R LE grossly 3/5 - able to SLR against gravity, L LE grossly 2/5)       Communication     Cognition Arousal: Alert Behavior During Therapy: WFL for tasks assessed/performed Overall Cognitive Status: Within Functional Limits for tasks assessed                                       General Comments       Exercises Other Exercises Other Exercises: Pt educated in benefits of increasing active participation in self care tasks to support maintaining strength and activity tolerance (pt typically allows staff to complete all tasks for him)   Shoulder Instructions      Home Living Family/patient expects to be discharged to:: Skilled nursing facility Living Arrangements: Alone Available Help at Discharge: Available PRN/intermittently;Personal care attendant (has aide QD 3-4 hours)   Home Access: Ramped entrance     Home Layout: Able to live on main level with bedroom/bathroom     Bathroom Shower/Tub: Tub/shower unit;Sponge bathes at baseline   Bathroom Toilet: Standard Bathroom Accessibility: Yes (reports grab bars are positioned poorly, uses sink to pull up on)   Home Equipment: Agricultural consultant (2 wheels);Wheelchair - power;Wheelchair - manual          Prior Functioning/Environment Prior Level of Function : Needs assist;History of Falls (last six months)             Mobility Comments: Pt reports aide helps with getting up out of bed, he typically can transfer to/from wheel chair/reliner/bed but has been weaker in the  last week with much increased difficulty (including to falls while transferring this week) ADLs Comments: aide give sponge baths, preps meals, runs errands, manages meds, etc        OT Problem List: Decreased strength;Decreased range of motion;Pain;Cardiopulmonary status limiting activity;Impaired balance (sitting and/or standing);Decreased knowledge of use of DME or AE;Decreased activity tolerance;Obesity      OT Treatment/Interventions: Self-care/ADL training;Therapeutic exercise;Therapeutic activities;DME and/or AE instruction;Patient/family education;Balance training    OT Goals(Current goals can be found in the care plan section) Acute Rehab OT Goals Patient Stated Goal: get stronger so I can go home OT Goal Formulation: With patient Time For Goal Achievement: 01/19/23 Potential to Achieve Goals: Good ADL Goals Pt Will Perform Grooming: sitting;with set-up Pt Will Perform Upper Body Bathing: sitting;with min assist Pt Will Transfer to Toilet: with max assist;with mod assist;with +2 assist;stand pivot transfer;bedside commode (LRAD)  OT Frequency: Min 1X/week    Co-evaluation              AM-PAC OT "6 Clicks" Daily Activity     Outcome Measure  Help from another person eating meals?: None Help from another person taking care of personal grooming?: A Little Help from another person toileting, which includes using toliet, bedpan, or urinal?: Total Help from another person bathing (including washing, rinsing, drying)?: A Lot Help from another person to put on and taking off regular upper body clothing?: A Lot Help from another person to put on and taking off regular lower body clothing?: A Lot 6 Click Score: 14   End of Session    Activity Tolerance: Patient tolerated treatment well Patient left: in bed;with call bell/phone within reach;with bed alarm set  OT Visit Diagnosis: Other abnormalities of gait and mobility (R26.89);Muscle weakness (generalized) (M62.81)                 Time: 1308-6578 OT Time Calculation (min): 13 min Charges:  OT General Charges $OT Visit: 1 Visit OT Evaluation $OT Eval Moderate Complexity: 1 Mod  Arman Filter., MPH, MS, OTR/L ascom [phone] 01/05/23, 2:05 PM

## 2023-01-05 NOTE — Evaluation (Signed)
Physical Therapy Evaluation Patient Details Name: Jay Sandoval MRN: [id number] DOB: [birth year] Today's Date: 01/05/2023  History of Present Illness  82 y.o. male with medical history significant of diverticulosis, HFpEF, OSA on CPAP, COPD, chronic hypoxic respiratory failure on 3 L supplemental oxygen at nighttime and with exertion, CKD stage IIIb, lymphedema, morbid obesity with BMI of 60 with wheelchair dependence, hypothyroidism, hypertension, who presents to the ED due to rectal bleeding  Clinical Impression  Pt was pleasant and motivated but ultimately very limited with his mobility.  He struggled with getting to/from EOB needing excessive assist and though he was able to stand from highly elevated surface he was far from his apparent baseline where he is able to transfer to/from his electric w/c independently.  Pt will require PT to address functional limitations and work back to PLOF.        If plan is discharge home, recommend the following: Two people to help with bathing/dressing/bathroom;Direct supervision/assist for medications management;Direct supervision/assist for financial management;Assist for transportation;Help with stairs or ramp for entrance;Assistance with cooking/housework   Can travel by private vehicle   No    Equipment Recommendations None recommended by PT  Recommendations for Other Services       Functional Status Assessment Patient has had a recent decline in their functional status and demonstrates the ability to make significant improvements in function in a reasonable and predictable amount of time.     Precautions / Restrictions Precautions Precautions: Fall Restrictions Weight Bearing Restrictions: No      Mobility  Bed Mobility Overal bed mobility: Needs Assistance Bed Mobility: Supine to Sit, Sit to Supine     Supine to sit: Max assist Sit to supine: Max assist, +2 for physical assistance   General bed mobility comments: Pt struggled with  transitions to/from sitting.  Heavy assist with trunk and legs.  Good effort with limited function    Transfers Overall transfer level: Needs assistance Equipment used: Rolling walker (2 wheels) Transfers: Sit to/from Stand Sit to Stand: From elevated surface, Min assist           General transfer comment: Unable to rise with bed raised ~2", at 4-5" of elevation he was able to shift forward and up (never fully upright posture) leaning heavily on walker    Ambulation/Gait               General Gait Details: unable/unsafe to try ambulation, attempted weight shift with step/sliding L LE medially with minimal success  Stairs            Wheelchair Mobility     Tilt Bed    Modified Rankin (Stroke Patients Only)       Balance Overall balance assessment: Needs assistance Sitting-balance support: Bilateral upper extremity supported Sitting balance-Leahy Scale: Good       Standing balance-Leahy Scale: Poor Standing balance comment: highly reliant on walker with heavy forward lean, poor tolerance                             Pertinent Vitals/Pain Pain Assessment Pain Assessment: 0-10 Pain Score: 8  Pain Location: b/l shoulders, L>R knee, low back - acute on chronic "everywhere"    Home Living Family/patient expects to be discharged to:: Skilled nursing facility Living Arrangements: Alone Available Help at Discharge: Available PRN/intermittently;Personal care attendant (has aide QD 3-4 hours)   Home Access: Ramped entrance       Home Layout: Able to live  on main level with bedroom/bathroom Home Equipment: Rolling Walker (2 wheels);Wheelchair - power;Wheelchair - manual      Prior Function Prior Level of Function : Needs assist;History of Falls (last six months)             Mobility Comments: Pt reports aide helps with getting up out of bed, he typically can transfer to/from wheel chair/reliner/bed but has been weaker in the last week with  much increased difficulty (including to falls while transferring this week) ADLs Comments: aide give sponge baths, preps meals, runs errands, manages meds, etc     Extremity/Trunk Assessment   Upper Extremity Assessment Upper Extremity Assessment: Generalized weakness (unable to raise either shoulder >90, functional tricep and grip strength)    Lower Extremity Assessment Lower Extremity Assessment: Generalized weakness (R LE grossly 3/5 - able to SLR against gravity, L LE grossly 2/5)       Communication   Communication Communication: No apparent difficulties  Cognition Arousal: Alert Behavior During Therapy: WFL for tasks assessed/performed Overall Cognitive Status: Within Functional Limits for tasks assessed                                          General Comments General comments (skin integrity, edema, etc.): Pt reporting general pain, swelling and weakness.  Indicates that he normally transfers to/from w/c well - far from baseline at this point    Exercises     Assessment/Plan    PT Assessment Patient needs continued PT services  PT Problem List Decreased strength;Decreased range of motion;Decreased activity tolerance;Decreased balance;Decreased mobility;Decreased knowledge of use of DME;Decreased safety awareness;Pain       PT Treatment Interventions DME instruction;Functional mobility training;Therapeutic activities;Therapeutic exercise;Balance training;Patient/family education;Wheelchair mobility training    PT Goals (Current goals can be found in the Care Plan section)  Acute Rehab PT Goals Patient Stated Goal: get moving well enough to go back home PT Goal Formulation: With patient Time For Goal Achievement: 01/18/23 Potential to Achieve Goals: Fair    Frequency Min 1X/week     Co-evaluation               AM-PAC PT "6 Clicks" Mobility  Outcome Measure Help needed turning from your back to your side while in a flat bed without  using bedrails?: A Lot Help needed moving from lying on your back to sitting on the side of a flat bed without using bedrails?: Total Help needed moving to and from a bed to a chair (including a wheelchair)?: Total Help needed standing up from a chair using your arms (e.g., wheelchair or bedside chair)?: Total Help needed to walk in hospital room?: Total Help needed climbing 3-5 steps with a railing? : Total 6 Click Score: 7    End of Session Equipment Utilized During Treatment: Gait belt;Oxygen (3L - SpO2 remained in the mid/low 90s) Activity Tolerance: Patient tolerated treatment well;Patient limited by fatigue;Patient limited by pain Patient left: with call bell/phone within reach;in bed Nurse Communication: Mobility status;Need for lift equipment PT Visit Diagnosis: Muscle weakness (generalized) (M62.81);Difficulty in walking, not elsewhere classified (R26.2);Pain Pain - Right/Left: Left Pain - part of body: Shoulder;Knee    Time: 1027-2536 PT Time Calculation (min) (ACUTE ONLY): 24 min   Charges:   PT Evaluation $PT Eval Low Complexity: 1 Low PT Treatments $Therapeutic Activity: 8-22 mins PT General Charges $$ ACUTE PT VISIT: 1 Visit  Malachi Pro, DPT 01/05/2023, 10:39 AM

## 2023-01-05 NOTE — Progress Notes (Signed)
PROGRESS NOTE  Jay Sandoval    DOB: Feb 07, 1941, 82 y.o.  XBM:[id number]    Code Status: Full Code   DOA: 01/04/2023   LOS: 0   Brief hospital course  Jay Sandoval is a 82 y.o. male with a PMH significant for diverticulosis, HFpEF, OSA on CPAP, COPD, chronic hypoxic respiratory failure on 3 L supplemental oxygen at nighttime and with exertion, CKD stage IIIb, lymphedema, morbid obesity with BMI of 60 with wheelchair dependence, hypothyroidism, hypertension.  They presented from home to the ED on 01/04/2023 with rectal bleeding x 1 days. Denies abdominal pain. Has history of diverticulosis.   In the ED, it was found that they had normotensive at 123/65 with heart rate of 65. He was saturating at 93% on room air. He was afebrile 97.9 .  Significant findings included normal hemoglobin at 14.3, platelets of 127, potassium 3.8, glucose 100, creatinine 1.04 with GFR above 60. Bedside hemoccult stool test was positive.   They were initially treated with lasix.  They were admitted for concern that he may have a diverticular bleed.   Patient was admitted to medicine service for further workup and management of rectal bleeding as outlined in detail below.  01/05/23 -stable. Evaluated by PT who recommended SNF due to worsening from baseline. Patient is agreeable. TOC is engaged for placement. He is hemodynamically stable with normal hemoglobin that improved today and no further known bleeding so as long as he remains stable can discharge when a bed is available.   Assessment & Plan  Principal Problem:   Lower GI bleed Active Problems:   Congestive heart failure (HCC)   Chronic respiratory failure with hypoxia (HCC)   COPD (chronic obstructive pulmonary disease) (HCC)   Sleep apnea   Morbid obesity (HCC)   Lymphedema   Hypertension  rectal bleed- hemoccult positive on admission. No further bleeding. Has history of diverticular bleed and colonoscopy March 2022. No abdominal pain. Likely repeat  of diverticular bleed and/or hemorrhoids. Hgb remains within normal levels and even increased today so doubt there is a significant bleed. Hgb 14.3>14.5. Medically ready for discharge. - Hold home aspirin for now - GI consult if demonstrates active bleeding  Mobility decline- patient feels well but has decline in his independence to transfer to his power chair. Wheelchair bound at baseline. PT evaluated and recommending SNF. Patient is agreeable.  - continue PT/OT - TOC consulted for placement.    HFpEF- last echocardiogram in 2017 demonstrating EF above 55%.  Myoview around the same time demonstrated small defect in the apical anterior location consistent with prior MI.  There is no evidence of pulmonary edema, however peripheral edema is present.   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 - Continue home 3 L at bedtime and with exertion - Continue home bronchodilators   Sleep apnea - CPAP at bedtime   Morbid obesity (HCC) Patient has a history of morbid obesity with a BMI of 60.7.  Complicated by OSA, HFpEF, hypertension.  Additionally, patient is wheelchair-bound secondary to weight related complications.   Hypertension - Resume home antihypertensives   Lymphedema - SCDs ordered  Body mass index is 58.13 kg/m.  VTE ppx: SCDs Start: 01/04/23 1538   Diet:     Diet  Diet Heart Room service appropriate? Yes; Fluid consistency: Thin   Consultants: None   Subjective 01/05/23    Pt reports no abdominal pain or stool incontinence today. Feels well overall. Complains of chronic hip/back pain for which he takes voltaren gel typically and requested to get it here.    Objective   Vitals:   01/04/23 2130 01/04/23 2259 01/05/23 0500  01/05/23 0508  BP: (!) 176/65 (!) 166/76  (!) 130/51  Pulse: 69 68  64  Resp:  18  20  Temp:  97.8 F (36.6 C)  (!) 97.5 F (36.4 C)  TempSrc:      SpO2: 90% 100%  96%  Weight:   (!) 173.4 kg   Height:        Intake/Output Summary (Last 24 hours) at 01/05/2023 0716 Last data filed at 01/04/2023 2129 Gross per 24 hour  Intake 15 ml  Output 1900 ml  Net -1885 ml   Filed Weights   01/04/23 1209 01/05/23 0500  Weight: (!) 181 kg (!) 173.4 kg    Physical Exam:  General: awake, alert, NAD. Severely obese HEENT: atraumatic, clear conjunctiva, anicteric sclera, MMM, hearing grossly normal Respiratory: normal respiratory effort. Cardiovascular: quick capillary refill, normal S1/S2, RRR, no JVD, murmurs Gastrointestinal: soft, NT, ND Nervous: A&O x3. no gross focal neurologic deficits, normal speech Extremities: non-pitting edema vs lymphedema in limbs.  Skin: dry, intact, normal temperature, normal color. No rashes, lesions or ulcers on exposed skin Psychiatry: normal mood, congruent affect  Labs   I have personally reviewed the following labs and imaging studies CBC    Component Value Date/Time   WBC 5.2 01/05/2023 0251   RBC 4.63 01/05/2023 0251   HGB 14.5 01/05/2023 0251   HGB 14.2 08/10/2020 1411   HCT 43.1 01/05/2023 0251   PLT 128 (L) 01/05/2023 0251   MCV 93.1 01/05/2023 0251   MCH 31.3 01/05/2023 0251   MCHC 33.6 01/05/2023 0251   RDW 13.4 01/05/2023 0251      Latest Ref Rng & Units 01/05/2023    2:51 AM 01/04/2023   12:34 PM 12/20/2022   11:11 AM  BMP  Glucose 70 - 99 mg/dL 91  865  90   BUN 8 - 23 mg/dL 14  14  16    Creatinine 0.61 - 1.24 mg/dL 7.84  6.96  2.95   Sodium 135 - 145 mmol/L 138  139  142   Potassium 3.5 - 5.1 mmol/L 3.0  3.8  3.5   Chloride 98 - 111 mmol/L 94  100  103   CO2 22 - 32 mmol/L 31  28  33   Calcium 8.9 - 10.3 mg/dL 9.5  9.3  9.8     No results found.  Disposition Plan & Communication  Patient status: Observation  Admitted  From: Home Planned disposition location: Skilled nursing facility Anticipated discharge date: 9/12 pending SNF placement  Family Communication: none at bedside     Author: Leeroy Bock, DO [facility] 01/05/2023, 7:16 AM   Available by Epic secure chat 7AM-7PM. If 7PM-7AM, please contact night-coverage.  [facility] contact information found on [email or web address].

## 2023-01-05 NOTE — Progress Notes (Signed)
Nutrition Brief Note  Patient identified on the Malnutrition Screening Tool (MST) Report  82 y.o. male with medical history significant of diverticulosis, HFpEF, OSA on CPAP, COPD, chronic hypoxic respiratory failure on 3 L supplemental oxygen at nighttime and with exertion, CKD stage IIIb, lymphedema, morbid obesity with wheelchair dependence, chronic pain, hypothyroidism, hypertension, HLD, DDD and GERD who is admitted with GIB.   Met with pt in room today. Pt reports good appetite and oral intake pta and in hospital. Pt reports a recent 30lb(8%) weight loss since starting Wegovy ~ 3 months ago. RD discussed with pt the importance of adequate nutrition needed to preserve lean muscle. Pt is willing to drink chocolate Ensure in hospital. RD will add Ensure Max to help pt meet his protein needs.   Wt Readings from Last 15 Encounters:  01/05/23 (!) 173.4 kg  12/20/22 (!) 181.4 kg  11/23/21 (!) 173.7 kg  08/10/20 (!) 173.7 kg  07/09/20 (!) 185 kg  01/09/20 (!) 178.7 kg  12/05/18 (!) 188.7 kg  09/07/17 (!) 180.5 kg  03/22/16 (!) 177.4 kg  04/23/15 (!) 175.5 kg    Body mass index is 58.13 kg/m. Patient meets criteria for morbid obesity based on current BMI.   Current diet order is heart healthy, patient is consuming approximately 100% of meals at this time. Labs and medications reviewed.   No nutrition interventions warranted at this time. If nutrition issues arise, please consult RD.   Betsey Holiday MS, RD, LDN Please refer to [facility] for RD and/or RD on-call/weekend/after hours pager

## 2023-01-05 NOTE — [place] FL2 (Signed)
[place] MEDICAID FL2 LEVEL OF CARE FORM     IDENTIFICATION  Patient Name: Jay Sandoval Birthdate: October 24, 1940 Sex: male Admission Date (Current Location): 01/04/2023  [place] and [place] Number:  Chiropodist and Address:  [facility], [address] [place], [place] [address]      Provider Number: [id number]  Attending Physician Name and Address:  Leeroy Bock, MD  Relative Name and Phone Number:  Esaw Grandchild (Niece)  [phone]    Current Level of Care: Hospital Recommended Level of Care:   Prior Approval Number:    Date Approved/Denied:   PASRR Number: [id number]  Discharge Plan: SNF    Current Diagnoses: Patient Active Problem List   Diagnosis Date Noted   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   Ambulatory dysfunction 11/12/2020   Rectal bleeding    Polyp of colon    Lower GI bleed 07/09/2020   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   Polyarthralgia 11/12/2018   Primary osteoarthritis involving multiple joints 11/12/2018   Chronic respiratory failure with hypoxia (HCC) 04/13/2018   Asthma without status asthmaticus 09/07/2017   Chronic lower back pain 09/07/2017   COPD (chronic obstructive pulmonary disease) (HCC) 09/07/2017   GERD (gastroesophageal reflux disease) 09/07/2017   Hyperlipidemia, unspecified 09/07/2017   Psoriasis 09/07/2017   Sleep apnea 09/07/2017   Hypertension 09/07/2017   Chronic pain of both knees (Primary Area of Pain)(R>L) 09/07/2017   Chronic pain of both shoulders  (Secondary Area of Pain)(R>L) 09/07/2017   Chronic bilateral low back pain without sciatica (Tertiary Area of Pain)(R>L) 09/07/2017   Wrist pain, chronic, left (Fourth Area of Pain) 09/07/2017   Chronic hand pain, left 09/07/2017   Chronic pain syndrome 09/07/2017   Long term current use of opiate analgesic 09/07/2017   Pharmacologic therapy 09/07/2017   Disorder of skeletal system 09/07/2017    Problems influencing health status 09/07/2017   Morbid obesity (HCC) 03/22/2016   Swelling of limb 03/22/2016   Lymphedema 03/22/2016   Preop cardiovascular exam 07/07/2015   Chest pain with high risk for cardiac etiology 06/23/2015   SOB (shortness of breath) on exertion 06/23/2015   Dependence on continuous positive airway pressure ventilation 06/17/2015   Congestive heart failure (HCC) 03/11/2015   Venous stasis 03/11/2015   Asthma 03/11/2015   Benign hypertension 03/11/2015   Hypercholesterolemia 03/11/2015   Obstructive sleep apnea syndrome 03/11/2015   Back muscle spasm 02/26/2015   DDD (degenerative disc disease), lumbar 10/20/2014   Osteoarthritis of knees, bilateral 11/19/2013    Orientation RESPIRATION BLADDER Height & Weight     Self, Time, Situation, Place  O2 Incontinent Weight: (!) 382 lb 4.4 oz (173.4 kg) Height:  5\' 8"  (172.7 cm)  BEHAVIORAL SYMPTOMS/MOOD NEUROLOGICAL BOWEL NUTRITION STATUS      Continent    AMBULATORY STATUS COMMUNICATION OF NEEDS Skin   Extensive Assist Verbally Normal                       Personal Care Assistance Level of Assistance  Bathing, Feeding, Dressing Bathing Assistance: Maximum assistance Feeding assistance: Independent Dressing Assistance: Maximum assistance     Functional Limitations Info  Sight, Hearing, Speech Sight Info: Adequate Hearing Info: Adequate Speech Info: Adequate    SPECIAL CARE FACTORS FREQUENCY  PT (By licensed PT), OT (By licensed OT)     PT Frequency: 5 times a week OT Frequency: 5 times a week  Contractures Contractures Info: Not present    Additional Factors Info  Code Status Code Status Info: FULL             Current Medications (01/05/2023):  This is the current hospital active medication list Current Facility-Administered Medications  Medication Dose Route Frequency Provider Last Rate Last Admin   acetaminophen (TYLENOL) tablet 650 mg  650 mg Oral Q6H PRN Verdene Lennert, MD       Or   acetaminophen (TYLENOL) suppository 650 mg  650 mg Rectal Q6H PRN Verdene Lennert, MD       albuterol (PROVENTIL) (2.5 MG/3ML) 0.083% nebulizer solution 3 mL  3 mL Nebulization Q6H PRN Verdene Lennert, MD       allopurinol (ZYLOPRIM) tablet 200 mg  200 mg Oral Daily Verdene Lennert, MD   200 mg at 01/05/23 1012   amLODipine (NORVASC) tablet 5 mg  5 mg Oral Daily Sharen Hones, RPH   5 mg at 01/05/23 1012   aspirin EC tablet 81 mg  81 mg Oral Daily Verdene Lennert, MD   81 mg at 01/05/23 1012   atenolol (TENORMIN) tablet 50 mg  50 mg Oral Daily Verdene Lennert, MD   50 mg at 01/05/23 1013   atorvastatin (LIPITOR) tablet 10 mg  10 mg Oral Daily Sharen Hones, RPH   10 mg at 01/05/23 1012   benazepril (LOTENSIN) tablet 40 mg  40 mg Oral Daily Verdene Lennert, MD   40 mg at 01/05/23 1013   cycloSPORINE (RESTASIS) 0.05 % ophthalmic emulsion 1 drop  1 drop Both Eyes BID Verdene Lennert, MD   1 drop at 01/05/23 1013   diclofenac Sodium (VOLTAREN) 1 % topical gel 2 g  2 g Topical QID Leeroy Bock, MD   2 g at 01/05/23 1014   DULoxetine (CYMBALTA) DR capsule 60 mg  60 mg Oral Daily Verdene Lennert, MD   60 mg at 01/05/23 1012   hydrocortisone cream 1 % 1 Application  1 Application Topical TID PRN Verdene Lennert, MD       levothyroxine (SYNTHROID) tablet 25 mcg  25 mcg Oral Q0600 Verdene Lennert, MD   25 mcg at 01/05/23 0552   mometasone-formoterol (DULERA) 200-5 MCG/ACT inhaler 2 puff  2 puff Inhalation BID Verdene Lennert, MD       multivitamin with minerals tablet 1 tablet  1 tablet Oral Daily Verdene Lennert, MD   1 tablet at 01/05/23 1012   ondansetron (ZOFRAN) tablet 4 mg  4 mg Oral Q6H PRN Verdene Lennert, MD       Or   ondansetron (ZOFRAN) injection 4 mg  4 mg Intravenous Q6H PRN Verdene Lennert, MD       potassium chloride SA (KLOR-CON M) CR tablet 40 mEq  40 mEq Oral BID Leeroy Bock, MD   40 mEq at 01/05/23 1012   pregabalin (LYRICA) capsule 100 mg  100  mg Oral BID Verdene Lennert, MD   100 mg at 01/05/23 1013   protein supplement (ENSURE MAX) liquid  11 oz Oral BID Leeroy Bock, MD       pyridOXINE (VITAMIN B6) tablet 100 mg  100 mg Oral Daily Verdene Lennert, MD   100 mg at 01/05/23 0011   senna-docusate (Senokot-S) tablet 2 tablet  2 tablet Oral QHS Verdene Lennert, MD   2 tablet at 01/04/23 2119   sodium chloride flush (NS) 0.9 % injection 3 mL  3 mL Intravenous Q12H Verdene Lennert, MD   3  mL at 01/05/23 1014   torsemide (DEMADEX) tablet 40 mg  40 mg Oral Daily Verdene Lennert, MD   40 mg at 01/05/23 1012   traMADol (ULTRAM) tablet 50 mg  50 mg Oral Q12H PRN Manuela Schwartz, NP   50 mg at 01/04/23 2217     Discharge Medications: Please see discharge summary for a list of discharge medications.  Relevant Imaging Results:  Relevant Lab Results:   Additional Information SS [id number]  Allena Katz, LCSW

## 2023-01-06 DIAGNOSIS — K625 Hemorrhage of anus and rectum: Secondary | ICD-10-CM | POA: Diagnosis not present

## 2023-01-06 DIAGNOSIS — K922 Gastrointestinal hemorrhage, unspecified: Secondary | ICD-10-CM | POA: Diagnosis not present

## 2023-01-06 LAB — CBC
HCT: 41.3 % (ref 39.0–52.0)
Hemoglobin: 14.1 g/dL (ref 13.0–17.0)
MCH: 31.5 pg (ref 26.0–34.0)
MCHC: 34.1 g/dL (ref 30.0–36.0)
MCV: 92.2 fL (ref 80.0–100.0)
Platelets: 125 10*3/uL — ABNORMAL LOW (ref 150–400)
RBC: 4.48 MIL/uL (ref 4.22–5.81)
RDW: 13.4 % (ref 11.5–15.5)
WBC: 6.1 10*3/uL (ref 4.0–10.5)
nRBC: 0 % (ref 0.0–0.2)

## 2023-01-06 MED ORDER — LACTULOSE 10 GM/15ML PO SOLN: 10.0000 g | Freq: Every day | ORAL | Status: DC | PRN

## 2023-01-06 MED ORDER — POLYETHYLENE GLYCOL 3350 17 G PO PACK
17.0000 g | PACK | Freq: Every day | ORAL | Status: DC
  Administered 2023-01-06 – 2023-01-10 (×4): 17 g via ORAL
  Filled 2023-01-06 (×5): qty 1

## 2023-01-06 NOTE — Progress Notes (Signed)
PROGRESS NOTE  Jay Sandoval    DOB: 09-13-1940, 82 y.o.  ZOX:[id number]    Code Status: Full Code   DOA: 01/04/2023   LOS: 0   Brief hospital course  Jay Sandoval is a 82 y.o. male with a PMH significant for diverticulosis, HFpEF, OSA on CPAP, COPD, chronic hypoxic respiratory failure on 3 L supplemental oxygen at nighttime and with exertion, CKD stage IIIb, lymphedema, morbid obesity with BMI of 60 with wheelchair dependence, hypothyroidism, hypertension.  They presented from home to the ED on 01/04/2023 with rectal bleeding x 1 days. Denies abdominal pain. Has history of diverticulosis.   In the ED, it was found that they had normotensive at 123/65 with heart rate of 65. He was saturating at 93% on room air. He was afebrile 97.9 .  Significant findings included normal hemoglobin at 14.3, platelets of 127, potassium 3.8, glucose 100, creatinine 1.04 with GFR above 60. Bedside hemoccult stool test was positive.   They were initially treated with lasix.  They were admitted for concern that he may have a diverticular bleed.   Patient was admitted to medicine service for further workup and management of rectal bleeding as outlined in detail below.  01/06/23 -stable. Evaluated by PT who recommended SNF due to worsening from baseline. Patient is agreeable. TOC is engaged for placement. He is hemodynamically stable with normal hemoglobin that remains stable and no further known bleeding. He remains stable and can discharge to SNF when a bed is available.   Assessment & Plan  Principal Problem:   Lower GI bleed Active Problems:   Congestive heart failure (HCC)   Chronic respiratory failure with hypoxia (HCC)   COPD (chronic obstructive pulmonary disease) (HCC)   Sleep apnea   Morbid obesity (HCC)   Lymphedema   Hypertension  rectal bleed- hemoccult positive on admission. No further bleeding. Has history of diverticular bleed and colonoscopy March 2022. No abdominal pain. Likely repeat of  diverticular bleed and/or hemorrhoids. Hgb remains within normal levels and even increased after admission so doubt there is a significant bleed. Hgb 14.3>14.5>14.1. Medically ready for discharge. - Hold home aspirin for now - GI consult if demonstrates active bleeding  Mobility decline- patient feels well but has decline in his independence to transfer to his power chair. Wheelchair bound at baseline. PT evaluated and recommending SNF. Patient is agreeable.  - continue PT/OT - TOC consulted for placement.    HFpEF- last echocardiogram in 2017 demonstrating EF above 55%.  Myoview around the same time demonstrated small defect in the apical anterior location consistent with prior MI.  There is no evidence of pulmonary edema, however peripheral edema is present.   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 - Continue home 3 L at bedtime and with exertion - Continue home bronchodilators   Sleep apnea - CPAP at bedtime   Morbid obesity (HCC) Patient has a history of morbid obesity with a BMI of 60.7.  Complicated by OSA, HFpEF, hypertension.  Additionally, patient is wheelchair-bound secondary to weight related complications.   Hypertension - Resume home antihypertensives   Lymphedema - SCDs ordered  Body mass index is 58.13 kg/m.  VTE ppx: SCDs Start: 01/04/23 1538   Diet:     Diet  Diet Heart Room service appropriate? Yes; Fluid consistency: Thin   Consultants: None   Subjective 01/06/23    Pt reports no abdominal pain or stool incontinence. He complains of his bed being uncomfortable due to a mattress malfunction. The problem was addressed with his nurse who is working on a solution. No other concerns or complaints at this  time.   Objective   Vitals:   01/05/23 1014 01/05/23 1701 01/05/23 2146 01/06/23 0417  BP: (!) 140/65 136/67 (!) 154/54 (!) 128/51  Pulse: 66 63 69 67  Resp: 17 19 20 20   Temp: (!) 97.5 F (36.4 C) 98.4 F (36.9 C) 98.8 F (37.1 C) 98.9 F (37.2 C)  TempSrc:   Oral   SpO2: 94% 98% 99% 93%  Weight:      Height:       No intake or output data in the 24 hours ending 01/06/23 0749  Filed Weights   01/04/23 1209 01/05/23 0500  Weight: (!) 181 kg (!) 173.4 kg    Physical Exam:  General: awake, alert, NAD. Severely obese HEENT: atraumatic, clear conjunctiva, anicteric sclera, MMM, hearing grossly normal Respiratory: normal respiratory effort. Cardiovascular: quick capillary refill, normal S1/S2, RRR, no JVD, murmurs Gastrointestinal: soft, NT, ND Nervous: A&O x3. no gross focal neurologic deficits, normal speech Extremities: non-pitting edema vs lymphedema in limbs.  Skin: dry, intact, normal temperature, normal color. No rashes, lesions or ulcers on exposed skin Psychiatry: normal mood, congruent affect  Labs   I have personally reviewed the following labs and imaging studies CBC    Component Value Date/Time   WBC 6.1 01/06/2023 0340   RBC 4.48 01/06/2023 0340   HGB 14.1 01/06/2023 0340   HGB 14.2 08/10/2020 1411   HCT 41.3 01/06/2023 0340   PLT 125 (L) 01/06/2023 0340   MCV 92.2 01/06/2023 0340   MCH 31.5 01/06/2023 0340   MCHC 34.1 01/06/2023 0340   RDW 13.4 01/06/2023 0340      Latest Ref Rng & Units 01/05/2023    2:51 AM 01/04/2023   12:34 PM 12/20/2022   11:11 AM  BMP  Glucose 70 - 99 mg/dL 91  644  90   BUN 8 - 23 mg/dL 14  14  16    Creatinine 0.61 - 1.24 mg/dL 0.34  7.42  5.95   Sodium 135 - 145 mmol/L 138  139  142   Potassium 3.5 - 5.1 mmol/L 3.0  3.8  3.5   Chloride 98 - 111 mmol/L 94  100  103   CO2 22 - 32 mmol/L 31  28  33   Calcium 8.9 - 10.3 mg/dL 9.5  9.3  9.8     No results found.  Disposition Plan & Communication  Patient status:  Observation  Admitted From: Home Planned disposition location: Skilled nursing facility Anticipated discharge date: 9/12 pending SNF placement  Family Communication: none at bedside     Author: Leeroy Bock, DO [facility] 01/06/2023, 7:49 AM   Available by Epic secure chat 7AM-7PM. If 7PM-7AM, please contact night-coverage.  [facility] contact information found on [email or web address].

## 2023-01-06 NOTE — Progress Notes (Signed)
Physical Therapy Treatment Patient Details Name: JAEMS GERSTENBERGER MRN: [id number] DOB: [birth year] Today's Date: 01/06/2023   History of Present Illness 82 y.o. male with medical history significant of diverticulosis, HFpEF, OSA on CPAP, COPD, chronic hypoxic respiratory failure on 3 L supplemental oxygen at nighttime and with exertion, CKD stage IIIb, lymphedema, morbid obesity with BMI of 60 with wheelchair dependence, hypothyroidism, hypertension, who presents to the ED due to rectal bleeding    PT Comments  Pt was pleasant and motivated to participate during the session and put forth good effort throughout. ~15 minutes taken during start of session to assist nursing with hoyer lift to new bed. Once pt completed transfer to new bed, provided Total assist +3 for rolling, as well as getting pt up seated at EOB. Once up, pt unable to adjust or reposition himself without PT assist. Pt was able to perform STS with BRW at +3 Mod A, providing cues to lean forwards and upwards. Pt able to advance with 2 side steps, but needing mod assist with LLE to advance lower extremity. Pt only able to tolerate ~2 min of standing before needing to sit back down. Pt will benefit from continued PT services upon discharge to safely address deficits listed in patient problem list for decreased caregiver assistance and eventual return to PLOF.    If plan is discharge home, recommend the following: Two people to help with bathing/dressing/bathroom;Direct supervision/assist for medications management;Direct supervision/assist for financial management;Assist for transportation;Help with stairs or ramp for entrance;Assistance with cooking/housework;Two people to help with walking and/or transfers   Can travel by private vehicle     No  Equipment Recommendations  Other (comment) (TDB at next venue of care)    Recommendations for Other Services       Precautions / Restrictions Precautions Precautions:  Fall Restrictions Weight Bearing Restrictions: No     Mobility  Bed Mobility Overal bed mobility: Needs Assistance Bed Mobility: Supine to Sit, Sit to Supine, rolling    Rolling: +3 for physical assistance, total assist Supine to sit: Total assist, +3 for physical assistance, HOB elevated Sit to supine: +3 for physical assistance, Total assist   General bed mobility comments: Struggling with bed mobility, total assist for repositioning and adjustments.    Transfers Overall transfer level: Needs assistance Equipment used: Rolling walker (2 wheels)   Sit to Stand: +2 physical assistance, Mod assist           General transfer comment: With elevated EOB, cues to shift forwards and upwards, continues to lean heavily on walker with arms bent onto walker.    Ambulation/Gait               General Gait Details: unable/unsafe to try ambulation, able to take 2 steps sideways, with mod A to advance LLE.   Stairs             Wheelchair Mobility     Tilt Bed    Modified Rankin (Stroke Patients Only)       Balance Overall balance assessment: Needs assistance Sitting-balance support: Bilateral upper extremity supported Sitting balance-Leahy Scale: Fair     Standing balance support: Bilateral upper extremity supported Standing balance-Leahy Scale: Poor Standing balance comment: reliant on RW, poor standing tolerance                            Cognition Arousal: Alert Behavior During Therapy: WFL for tasks assessed/performed Overall Cognitive Status: Within Functional Limits for  tasks assessed                                          Exercises      General Comments        Pertinent Vitals/Pain Pain Assessment Pain Assessment: Faces Faces Pain Scale: Hurts little more Pain Location: "everywhere"; bilateral knees Pain Descriptors / Indicators: Aching Pain Intervention(s): Monitored during session    Home Living                           Prior Function            PT Goals (current goals can now be found in the care plan section) Progress towards PT goals: Progressing toward goals    Frequency    Min 1X/week      PT Plan      Co-evaluation              AM-PAC PT "6 Clicks" Mobility   Outcome Measure  Help needed turning from your back to your side while in a flat bed without using bedrails?: Total Help needed moving from lying on your back to sitting on the side of a flat bed without using bedrails?: Total Help needed moving to and from a bed to a chair (including a wheelchair)?: Total Help needed standing up from a chair using your arms (e.g., wheelchair or bedside chair)?: Total Help needed to walk in hospital room?: Total Help needed climbing 3-5 steps with a railing? : Total 6 Click Score: 6    End of Session Equipment Utilized During Treatment: Gait belt;Oxygen Activity Tolerance: Patient tolerated treatment well;Patient limited by fatigue Patient left: with call bell/phone within reach;in bed Nurse Communication: Mobility status PT Visit Diagnosis: Muscle weakness (generalized) (M62.81);Difficulty in walking, not elsewhere classified (R26.2);Pain Pain - Right/Left:  (bilateral) Pain - part of body: Knee     Time: 1410-1506 PT Time Calculation (min) (ACUTE ONLY): 56 min  Charges:                            Cecile Sheerer, SPT 01/06/23, 5:04 PM

## 2023-01-06 NOTE — Plan of Care (Signed)

## 2023-01-07 DIAGNOSIS — R5381 Other malaise: Secondary | ICD-10-CM | POA: Diagnosis present

## 2023-01-07 DIAGNOSIS — K625 Hemorrhage of anus and rectum: Secondary | ICD-10-CM | POA: Diagnosis not present

## 2023-01-07 MED ORDER — CLOTRIMAZOLE 1 % EX CREA
TOPICAL_CREAM | Freq: Two times a day (BID) | CUTANEOUS | Status: DC
  Filled 2023-01-07: qty 15

## 2023-01-07 MED ORDER — POTASSIUM CHLORIDE CRYS ER 20 MEQ PO TBCR
40.0000 meq | EXTENDED_RELEASE_TABLET | Freq: Two times a day (BID) | ORAL | Status: AC
  Administered 2023-01-07 (×2): 40 meq via ORAL
  Filled 2023-01-07 (×2): qty 2

## 2023-01-07 MED ORDER — ENOXAPARIN SODIUM 100 MG/ML IJ SOSY
90.0000 mg | PREFILLED_SYRINGE | INTRAMUSCULAR | Status: DC
  Administered 2023-01-07 – 2023-01-10 (×4): 90 mg via SUBCUTANEOUS
  Filled 2023-01-07 (×4): qty 1

## 2023-01-07 NOTE — Care Management CC44 (Signed)
Condition Code 44 Documentation Completed  Patient Details  Name: Jay Sandoval MRN: [id number] Date of Birth: 08-06-1940   Condition Code 44 given:  Yes Patient signature on Condition Code 44 notice:  Yes Documentation of 2 MD's agreement:  Yes Code 44 added to claim:  Yes    Allena Katz, LCSW 01/07/2023, 3:39 PM

## 2023-01-07 NOTE — Care Management Obs Status (Signed)
MEDICARE OBSERVATION STATUS NOTIFICATION   Patient Details  Name: Jay Sandoval MRN: [id number] Date of Birth: 08/11/1940   Medicare Observation Status Notification Given:  Yes    Osmar Howton, LCSW 01/07/2023, 3:39 PM

## 2023-01-07 NOTE — TOC Progression Note (Signed)
Transition of Care [facility]) - Progression Note    Patient Details  Name: Jay Sandoval MRN: [id number] Date of Birth: 1940-05-29  Transition of Care [facility]) CM/SW Contact  Allena Katz, LCSW Phone Number: 01/07/2023, 11:23 AM  Clinical Narrative:   Pt has selected bed at [facility]. TOC to start auth for Monday.    Expected Discharge Plan: Skilled Nursing Facility Barriers to Discharge: Continued Medical Work up  Expected Discharge Plan and Services                                               Social Determinants of Health (SDOH) Interventions SDOH Screenings   Food Insecurity: Unknown (01/04/2023)  Housing: Low Risk  (01/04/2023)  Transportation Needs: No Transportation Needs (01/04/2023)  Utilities: Not At Risk (01/04/2023)  Financial Resource Strain: Low Risk  (12/20/2022)   Received from [facility]  Social Connections: Unknown (09/07/2021)   Received from [facility]  Tobacco Use: Medium Risk (01/04/2023)    Readmission Risk Interventions     No data to display

## 2023-01-07 NOTE — Progress Notes (Signed)
PROGRESS NOTE  Jay Sandoval    DOB: 11/23/1940, 82 y.o.  [id number]    Code Status: Full Code   DOA: 01/04/2023   LOS: 0   Brief hospital course  Jay Sandoval is a 82 y.o. male with a PMH significant for diverticulosis, HFpEF, OSA on CPAP, COPD, chronic hypoxic respiratory failure on 3 L supplemental oxygen at nighttime and with exertion, CKD stage IIIb, lymphedema, morbid obesity with BMI of 60 with wheelchair dependence, hypothyroidism, hypertension.  They presented from home to the ED on 01/04/2023 with rectal bleeding x 1 days. Denies abdominal pain. Has history of diverticulosis.   In the ED, it was found that they had normotensive at 123/65 with heart rate of 65. He was saturating at 93% on room air. He was afebrile 97.9 .  Significant findings included normal hemoglobin at 14.3, platelets of 127, potassium 3.8, glucose 100, creatinine 1.04 with GFR above 60. Bedside hemoccult stool test was positive.   They were initially treated with lasix.  They were admitted for concern that he may have a diverticular bleed.   Patient was admitted to medicine service for further workup and management of rectal bleeding as outlined in detail below.  01/07/23 -stable. Evaluated by PT who recommended SNF due to worsening from baseline. Patient is agreeable. TOC is engaged for placement. He is hemodynamically stable with normal hemoglobin that remains stable and no further known bleeding. He remains stable and can discharge to SNF when a bed is available.   Assessment & Plan  Principal Problem:   Lower GI bleed Active Problems:   Congestive heart failure (HCC)   Chronic respiratory failure with hypoxia (HCC)   COPD (chronic obstructive pulmonary disease) (HCC)   Sleep apnea   Morbid obesity (HCC)   Lymphedema   Hypertension  rectal bleed- hemoccult positive on admission. No further bleeding. Has history of diverticular bleed and colonoscopy March 2022. No abdominal pain. Likely repeat of  diverticular bleed and/or hemorrhoids. Hgb remains within normal levels and even increased after admission so doubt there is a significant bleed. Hgb 14.3>14.5>14.1. Medically ready for discharge. - Hold home aspirin for now - GI consult if demonstrates active bleeding  Mobility decline- patient feels well but has decline in his independence to transfer to his power chair. Wheelchair bound at baseline. PT evaluated and recommending SNF. Patient is agreeable.  - continue PT/OT - TOC consulted for placement.    HFpEF- last echocardiogram in 2017 demonstrating EF above 55%.  Myoview around the same time demonstrated small defect in the apical anterior location consistent with prior MI.  There is no evidence of pulmonary edema, however peripheral edema is present.   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 - Continue home 3 L at bedtime and with exertion - Continue home bronchodilators   Sleep apnea - CPAP at bedtime   Morbid obesity (HCC) Patient has a history of morbid obesity with a BMI of 60.7.  Complicated by OSA, HFpEF, hypertension.  Additionally, patient is wheelchair-bound secondary to weight related complications.   Hypertension - Resume home antihypertensives - stopped amlodipine for relative hypotension   Hypokalemia- K+ 3.0 - monitor and replete PRN   Lymphedema - SCDs ordered  Body mass index is 58.13 kg/m.  VTE ppx: SCDs Start: 01/04/23 1538. Starting lovenox  Diet:     Diet   Diet Heart Room service appropriate? Yes; Fluid consistency: Thin   Consultants: None   Subjective 01/07/23    Pt reports no complaints today other than wanting treatment for a heal callous. Discussed outpatient follow up for this.    Objective   Vitals:   01/06/23 0752 01/06/23 1618 01/06/23 2021 01/07/23 0424  BP: (!) 140/58 (!) 139/58 (!) 123/49 (!) 104/44  Pulse: 67 64 68 66  Resp: 16 16 18 16   Temp: 98.7 F (37.1 C) 98.7 F (37.1 C) 98.4 F (36.9 C) 98.4 F (36.9 C)  TempSrc:      SpO2: 93% 97% 97% 94%  Weight:      Height:        Intake/Output Summary (Last 24 hours) at 01/07/2023 0729 Last data filed at 01/07/2023 0424 Gross per 24 hour  Intake 1320 ml  Output 1000 ml  Net 320 ml    Filed Weights   01/04/23 1209 01/05/23 0500  Weight: (!) 181 kg (!) 173.4 kg    Physical Exam:  General: awake, alert, NAD. Severely obese HEENT: atraumatic, clear conjunctiva, anicteric sclera, MMM, hearing grossly normal Respiratory: normal respiratory effort. Cardiovascular: quick capillary refill, normal S1/S2, RRR, no JVD, murmurs Gastrointestinal: soft, NT, ND Nervous: A&O x3. no gross focal neurologic deficits, normal speech Extremities: non-pitting edema vs lymphedema in limbs.  Skin: dry, intact, normal temperature, normal color. No rashes, lesions or ulcers on exposed skin Psychiatry: normal mood, congruent affect  Labs   I have personally reviewed the following labs and imaging studies CBC    Component Value Date/Time   WBC 6.1 01/06/2023 0340   RBC 4.48 01/06/2023 0340   HGB 14.1 01/06/2023 0340   HGB 14.2 08/10/2020 1411   HCT 41.3 01/06/2023 0340   PLT 125 (L) 01/06/2023 0340   MCV 92.2 01/06/2023 0340   MCH 31.5 01/06/2023 0340   MCHC 34.1 01/06/2023 0340   RDW 13.4 01/06/2023 0340      Latest Ref Rng & Units 01/05/2023    2:51 AM 01/04/2023   12:34 PM 12/20/2022   11:11 AM  BMP  Glucose 70 - 99 mg/dL 91  696  90   BUN 8 - 23 mg/dL 14  14  16    Creatinine 0.61 - 1.24 mg/dL 2.95  2.84  1.32   Sodium 135 - 145 mmol/L 138  139  142   Potassium 3.5 - 5.1 mmol/L 3.0  3.8  3.5   Chloride 98 - 111 mmol/L 94  100  103   CO2 22 - 32 mmol/L 31  28  33   Calcium 8.9 - 10.3 mg/dL 9.5  9.3   9.8     No results found.  Disposition Plan & Communication  Patient status: Observation  Admitted From: Home Planned disposition location: Skilled nursing facility Anticipated discharge date: 9/16 pending SNF placement  Family Communication: none at bedside     Author: Leeroy Bock, DO [facility] 01/07/2023, 7:29 AM   Available by Epic secure chat 7AM-7PM. If 7PM-7AM, please contact night-coverage.  TR[facility] contact information found on [email or web address].

## 2023-01-07 NOTE — Progress Notes (Signed)
PHARMACIST - PHYSICIAN COMMUNICATION  CONCERNING:  Enoxaparin (Lovenox) for DVT Prophylaxis    Filed Weights   01/04/23 1209 01/05/23 0500  Weight: (!) 181 kg (399 lb 0.5 oz) (!) 173.4 kg (382 lb 4.4 oz)    Body mass index is 58.13 kg/m.  Estimated Creatinine Clearance: 84.4 mL/min (by C-G formula based on SCr of 1.09 mg/dL).   Based on [facility] policy patient is candidate for enoxaparin 0.5mg /kg TBW SQ every 24 hours based on BMI being >30.  DESCRIPTION: Pharmacy has adjusted enoxaparin dose per [facility] policy.  Patient is now receiving enoxaparin 0.5 mg/kg every 24 hours    Lowella Bandy, PharmD Clinical Pharmacist  01/07/2023 11:04 AM

## 2023-01-07 NOTE — TOC Progression Note (Signed)
Transition of Care [facility]) - Progression Note    Patient Details  Name: Jay Sandoval MRN: [id number] Date of Birth: December 22, 1940  Transition of Care [facility]) CM/SW Contact  Allena Katz, LCSW Phone Number: 01/07/2023, 11:33 AM  Clinical Narrative:   Berkley Harvey submitted for [facility] for Monday. Tanya notified.     Expected Discharge Plan: Skilled Nursing Facility Barriers to Discharge: Continued Medical Work up  Expected Discharge Plan and Services                                               Social Determinants of Health (SDOH) Interventions SDOH Screenings   Food Insecurity: Unknown (01/04/2023)  Housing: Low Risk  (01/04/2023)  Transportation Needs: No Transportation Needs (01/04/2023)  Utilities: Not At Risk (01/04/2023)  Financial Resource Strain: Low Risk  (12/20/2022)   Received from [facility]  Social Connections: Unknown (09/07/2021)   Received from [facility]  Tobacco Use: Medium Risk (01/04/2023)    Readmission Risk Interventions     No data to display

## 2023-01-08 DIAGNOSIS — K922 Gastrointestinal hemorrhage, unspecified: Secondary | ICD-10-CM | POA: Diagnosis not present

## 2023-01-08 DIAGNOSIS — K625 Hemorrhage of anus and rectum: Secondary | ICD-10-CM | POA: Diagnosis not present

## 2023-01-08 LAB — BASIC METABOLIC PANEL
Anion gap: 11 (ref 5–15)
BUN: 33 mg/dL — ABNORMAL HIGH (ref 8–23)
CO2: 35 mmol/L — ABNORMAL HIGH (ref 22–32)
Calcium: 9.5 mg/dL (ref 8.9–10.3)
Chloride: 96 mmol/L — ABNORMAL LOW (ref 98–111)
Creatinine, Ser: 1.16 mg/dL (ref 0.61–1.24)
GFR, Estimated: >60 mL/min (ref >60)
Glucose, Bld: 114 mg/dL — ABNORMAL HIGH (ref 70–99)
Potassium: 3.4 mmol/L — ABNORMAL LOW (ref 3.5–5.1)
Sodium: 142 mmol/L (ref 135–145)

## 2023-01-08 MED ORDER — POTASSIUM CHLORIDE CRYS ER 20 MEQ PO TBCR
40.0000 meq | EXTENDED_RELEASE_TABLET | Freq: Two times a day (BID) | ORAL | Status: AC
  Administered 2023-01-08 (×2): 40 meq via ORAL
  Filled 2023-01-08 (×2): qty 2

## 2023-01-08 NOTE — Progress Notes (Signed)
PROGRESS NOTE  Jay Sandoval    DOB: [birth year], 82 y.o.  BJY:[id number]    Code Status: Full Code   DOA: 01/04/2023   LOS: 1   Brief hospital course  Jay Sandoval is a 82 y.o. male with a PMH significant for diverticulosis, HFpEF, OSA on CPAP, COPD, chronic hypoxic respiratory failure on 3 L supplemental oxygen at nighttime and with exertion, CKD stage IIIb, lymphedema, morbid obesity with BMI of 60 with wheelchair dependence, hypothyroidism, hypertension.  They presented from home to the ED on 01/04/2023 with rectal bleeding x 1 days. Denies abdominal pain. Has history of diverticulosis.   In the ED, it was found that they were normotensive at 123/65 with heart rate of 65. He was saturating at 93% on room air. He was afebrile 97.9 .  Significant findings included normal hemoglobin at 14.3, platelets of 127, potassium 3.8, glucose 100, creatinine 1.04 with GFR above 60. Bedside hemoccult stool test was positive.   They were initially treated with lasix.  They were admitted for concern that he may have a diverticular bleed.   Patient was admitted to medicine service for further workup and management of rectal bleeding as outlined in detail below.  01/08/23 -stable. Evaluated by PT who recommended SNF due to worsening from baseline. Patient is agreeable. TOC is engaged for placement. He is hemodynamically stable with normal hemoglobin that remains stable and no further known bleeding. He remains stable and can discharge to SNF when a bed is available.   Assessment & Plan  Principal Problem:   Lower GI bleed Active Problems:   Congestive heart failure (HCC)   Chronic respiratory failure with hypoxia (HCC)   COPD (chronic obstructive pulmonary disease) (HCC)   Sleep apnea   Morbid obesity (HCC)   Lymphedema   Hypertension   Physical deconditioning  rectal bleed- hemoccult positive on admission. No further bleeding. Has history of diverticular bleed and colonoscopy March 2022. No  abdominal pain. Likely repeat of diverticular bleed and/or hemorrhoids. Hgb remains within normal levels and even increased after admission so doubt there is a significant bleed. Hgb 14.3>14.5>14.1. Medically ready for discharge. - Hold home aspirin for now - GI consult if demonstrates active bleeding  Mobility decline- patient feels well but has decline in his independence to transfer to his power chair. Wheelchair bound at baseline. PT evaluated and recommending SNF. Patient is agreeable.  - continue PT/OT - TOC consulted for placement.    HFpEF- last echocardiogram in 2017 demonstrating EF above 55%.  Myoview around the same time demonstrated small defect in the apical anterior location consistent with prior MI.  There is no evidence of pulmonary edema, however peripheral edema is present.   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 - Continue home 3 L at bedtime and with exertion - Continue home bronchodilators   Sleep apnea - CPAP at bedtime   Morbid obesity (HCC) Patient has a history of morbid obesity with a BMI of 60.7.  Complicated by OSA, HFpEF, hypertension.  Additionally, patient is wheelchair-bound secondary to weight related complications.   Hypertension - Resume home antihypertensives - stopped amlodipine for relative hypotension   Hypokalemia- K+ 3.0 - monitor and replete PRN   Lymphedema - SCDs ordered  Body mass  index is 58.13 kg/m.  VTE ppx: SCDs Start: 01/04/23 1538. Starting lovenox  Diet:     Diet   Diet Heart Room service appropriate? Yes; Fluid consistency: Thin   Consultants: None   Subjective 01/08/23    Pt reports no complaints today. States that he asked for his CPAP last night but did not receive  it. He would like to wear it tonight again.   Objective   Vitals:   01/07/23 0831 01/07/23 1622 01/07/23 2031 01/08/23 0500  BP: (!) 118/49 (!) 116/50 (!) 116/47 (!) 120/56  Pulse: 68 64 70 71  Resp: 18  18 19   Temp: 98.1 F (36.7 C) 98.4 F (36.9 C) 98.3 F (36.8 C) 98.3 F (36.8 C)  TempSrc:   Oral Oral  SpO2: 98% 95% 96% 97%  Weight:      Height:        Intake/Output Summary (Last 24 hours) at 01/08/2023 0740 Last data filed at 01/08/2023 0500 Gross per 24 hour  Intake 240 ml  Output 2101 ml  Net -1861 ml    Filed Weights   01/04/23 1209 01/05/23 0500  Weight: (!) 181 kg (!) 173.4 kg    Physical Exam:  General: awake, alert, NAD. Severely obese HEENT: atraumatic, clear conjunctiva, anicteric sclera, MMM, hearing grossly normal Respiratory: normal respiratory effort. Cardiovascular: quick capillary refill, normal S1/S2, RRR, no JVD, murmurs Gastrointestinal: soft, NT, ND Nervous: A&O x3. no gross focal neurologic deficits, normal speech Extremities: non-pitting edema vs lymphedema in limbs.  Skin: dry, intact, normal temperature, normal color. No rashes, lesions or ulcers on exposed skin Psychiatry: normal mood, congruent affect  Labs   I have personally reviewed the following labs and imaging studies CBC    Component Value Date/Time   WBC 6.1 01/06/2023 0340   RBC 4.48 01/06/2023 0340   HGB 14.1 01/06/2023 0340   HGB 14.2 08/10/2020 1411   HCT 41.3 01/06/2023 0340   PLT 125 (L) 01/06/2023 0340   MCV 92.2 01/06/2023 0340   MCH 31.5 01/06/2023 0340   MCHC 34.1 01/06/2023 0340   RDW 13.4 01/06/2023 0340      Latest Ref Rng & Units 01/08/2023    5:20 AM 01/05/2023    2:51 AM 01/04/2023   12:34 PM  BMP  Glucose 70 - 99 mg/dL 782  91  956   BUN 8 - 23 mg/dL 33  14  14   Creatinine 0.61 - 1.24 mg/dL 2.13  0.86  5.78   Sodium 135 - 145 mmol/L 142  138  139   Potassium 3.5 - 5.1 mmol/L 3.4  3.0  3.8   Chloride 98 - 111 mmol/L 96  94  100   CO2 22 - 32  mmol/L 35  31  28   Calcium 8.9 - 10.3 mg/dL 9.5  9.5  9.3     No results found.  Disposition Plan & Communication  Patient status: Observation  Admitted From: Home Planned disposition location: Skilled nursing facility Anticipated discharge date: 9/16 pending SNF placement  Family Communication: none at bedside     Author: Leeroy Bock, DO [facility] 01/08/2023, 7:40 AM   Available by Epic secure chat 7AM-7PM. If 7PM-7AM, please contact night-coverage.  TRH contact information found on [email or web address].

## 2023-01-08 NOTE — Plan of Care (Signed)

## 2023-01-09 ENCOUNTER — Encounter: Payer: Self-pay | Admitting: Urology

## 2023-01-09 DIAGNOSIS — K922 Gastrointestinal hemorrhage, unspecified: Secondary | ICD-10-CM | POA: Diagnosis not present

## 2023-01-09 DIAGNOSIS — K625 Hemorrhage of anus and rectum: Secondary | ICD-10-CM | POA: Diagnosis not present

## 2023-01-09 NOTE — Progress Notes (Signed)
PROGRESS NOTE  Jay Sandoval    DOB: Jul 16, 1940, 82 y.o.  OZH:[id number]    Code Status: Full Code   DOA: 01/04/2023   LOS: 1   Brief hospital course  Jay Sandoval is a 82 y.o. male with a PMH significant for diverticulosis, HFpEF, OSA on CPAP, COPD, chronic hypoxic respiratory failure on 3 L supplemental oxygen at nighttime and with exertion, CKD stage IIIb, lymphedema, morbid obesity with BMI of 60 with wheelchair dependence, hypothyroidism, hypertension.  They presented from home to the ED on 01/04/2023 with rectal bleeding x 1 days. Denies abdominal pain. Has history of diverticulosis.   In the ED, it was found that they were normotensive at 123/65 with heart rate of 65. He was saturating at 93% on room air. He was afebrile 97.9 .  Significant findings included normal hemoglobin at 14.3, platelets of 127, potassium 3.8, glucose 100, creatinine 1.04 with GFR above 60. Bedside hemoccult stool test was positive.   They were initially treated with lasix.  They were admitted for concern that he may have a diverticular bleed.   Patient was admitted to medicine service for further workup and management of rectal bleeding as outlined in detail below.  01/09/23 -stable. Evaluated by PT who recommended SNF due to worsening from baseline. Patient is agreeable. TOC is engaged for placement. He is hemodynamically stable with normal hemoglobin that remains stable and no further known bleeding. He remains stable and can discharge to SNF when a bed is available. Awaiting insurance auth.  Assessment & Plan  Principal Problem:   Lower GI bleed Active Problems:   Congestive heart failure (HCC)   Chronic respiratory failure with hypoxia (HCC)   COPD (chronic obstructive pulmonary disease) (HCC)   Sleep apnea   Morbid obesity (HCC)   Lymphedema   Hypertension   Physical deconditioning  rectal bleed- hemoccult positive on admission. No further bleeding. Has history of diverticular bleed and  colonoscopy March 2022. No abdominal pain. Likely repeat of diverticular bleed and/or hemorrhoids. Hgb remains within normal levels and even increased after admission so doubt there is a significant bleed. Hgb 14.3>14.5>14.1. Medically ready for discharge. - Hold home aspirin for now - GI consult if demonstrates active bleeding  Mobility decline- patient feels well but has decline in his independence to transfer to his power chair. Wheelchair bound at baseline. PT evaluated and recommending SNF. Patient is agreeable.  - continue PT/OT - TOC consulted for placement.    HFpEF- last echocardiogram in 2017 demonstrating EF above 55%.  Myoview around the same time demonstrated small defect in the apical anterior location consistent with prior MI.  There is no evidence of pulmonary edema, however peripheral edema is present.   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 - Continue home 3 L at bedtime and with exertion - Continue home bronchodilators   Sleep apnea - CPAP at bedtime   Morbid obesity (HCC) Patient has a history of morbid obesity with a BMI of 60.7.  Complicated by OSA, HFpEF, hypertension.  Additionally, patient is wheelchair-bound secondary to weight related complications.   Hypertension - Resume home antihypertensives - stopped amlodipine for relative hypotension   Hypokalemia- K+ 3.0 - monitor and replete PRN   Lymphedema - SCDs ordered  Body mass index is 58.13 kg/m.  VTE ppx: SCDs Start: 01/04/23 1538. Starting lovenox  Diet:     Diet   Diet Heart Room service appropriate? Yes; Fluid consistency: Thin   Consultants: None   Subjective 01/09/23    Pt reports no complaints today. He is patiently awaiting transfer to  SNF.    Objective   Vitals:   01/08/23 0835 01/08/23 1600 01/08/23 2119 01/09/23 0522  BP:  117/64 (!) 109/57 (!) 118/59  Pulse:  70 68 69  Resp:  20 18   Temp:  98.2 F (36.8 C) 98.1 F (36.7 C) 98.4 F (36.9 C)  TempSrc:    Oral  SpO2: 94% 99% 95% 98%  Weight:      Height:        Intake/Output Summary (Last 24 hours) at 01/09/2023 0725 Last data filed at 01/09/2023 0522 Gross per 24 hour  Intake --  Output 1750 ml  Net -1750 ml    Filed Weights   01/04/23 1209 01/05/23 0500  Weight: (!) 181 kg (!) 173.4 kg    Physical Exam:  General: awake, alert, NAD. Severely obese HEENT: atraumatic, clear conjunctiva, anicteric sclera, MMM, hearing grossly normal Respiratory: normal respiratory effort. Cardiovascular: quick capillary refill, normal S1/S2, RRR, no JVD, murmurs Gastrointestinal: soft, NT, ND Nervous: A&O x3. no gross focal neurologic deficits, normal speech Extremities: non-pitting edema vs lymphedema in limbs.  Skin: dry, intact, normal temperature, normal color. No rashes, lesions or ulcers on exposed skin Psychiatry: normal mood, congruent affect  Labs   I have personally reviewed the following labs and imaging studies CBC    Component Value Date/Time   WBC 6.1 01/06/2023 0340   RBC 4.48 01/06/2023 0340   HGB 14.1 01/06/2023 0340   HGB 14.2 08/10/2020 1411   HCT 41.3 01/06/2023 0340   PLT 125 (L) 01/06/2023 0340   MCV 92.2 01/06/2023 0340   MCH 31.5 01/06/2023 0340   MCHC 34.1 01/06/2023 0340   RDW 13.4 01/06/2023 0340      Latest Ref Rng & Units 01/08/2023    5:20 AM 01/05/2023    2:51 AM 01/04/2023   12:34 PM  BMP  Glucose 70 - 99 mg/dL 956  91  387   BUN 8 - 23 mg/dL 33  14  14   Creatinine 0.61 - 1.24 mg/dL 5.64  3.32  9.51   Sodium 135 - 145 mmol/L 142  138  139   Potassium 3.5 - 5.1 mmol/L 3.4  3.0  3.8   Chloride 98 - 111 mmol/L 96  94  100   CO2 22 - 32 mmol/L 35  31  28   Calcium 8.9 - 10.3 mg/dL 9.5  9.5  9.3     No results  found.  Disposition Plan & Communication  Patient status: Observation  Admitted From: Home Planned disposition location: Skilled nursing facility Anticipated discharge date: 9/16 pending SNF placement  Family Communication: none at bedside     Author: Leeroy Bock, DO [facility] 01/09/2023, 7:25 AM   Available by Epic secure chat 7AM-7PM. If 7PM-7AM, please contact night-coverage.  [facility] contact information found on [email or web address].

## 2023-01-09 NOTE — TOC Progression Note (Signed)
Transition of Care [facility]) - Progression Note    Patient Details  Name: Jay Sandoval MRN: [id number] Date of Birth: 10/28/1940  Transition of Care [facility]) CM/SW Contact  Garret Reddish, RN Phone Number: 01/09/2023, 3:44 PM  Clinical Narrative:     Chart reviewed.  Patient was approved for SNF.  Plan Auth ID [id number], Auth [id number].  Patient has been approved from 01-08-2023-01-11-2023.  Next review date will be 01-11-2023.    Authorization was submitted for [facility] and Rehab.  I have spoken with Kenney Houseman with [facility] and Rehab.  She informs me that Mrs. Pratham Beissel was not in her in-basket.  She is willing to look at Mr. Datu and consider admission.  Kenney Houseman reports that she is able to accept patient.  Kenney Houseman reports that she will need orders for CPAP and will need to order a bariatric bed for the patient.  Kenney Houseman will be able to accept patient to the facility pending the following DME can be delivered to the facility on tomorrow.  Kenney Houseman also informs me that the facility has a Covid outbreak and the patient all attempts would be made to keep patient away for the Covid patients.    I have informed Dr. Dareen Piano of the above information.      Expected Discharge Plan: Skilled Nursing Facility Barriers to Discharge: Continued Medical Work up  Expected Discharge Plan and Services                                               Social Determinants of Health (SDOH) Interventions SDOH Screenings   Food Insecurity: Unknown (01/04/2023)  Housing: Low Risk  (01/04/2023)  Transportation Needs: No Transportation Needs (01/04/2023)  Utilities: Not At Risk (01/04/2023)  Financial Resource Strain: Low Risk  (12/20/2022)   Received from [facility]  Social Connections: Unknown (09/07/2021)   Received from [facility]  Tobacco Use: Medium Risk (01/04/2023)    Readmission Risk Interventions     No data to display

## 2023-01-09 NOTE — Progress Notes (Signed)
Physical Therapy Treatment Patient Details Name: Jay Sandoval MRN: [id number] DOB: 10/10/1940 Today's Date: 01/09/2023   History of Present Illness 82 y.o. male with medical history significant of diverticulosis, HFpEF, OSA on CPAP, COPD, chronic hypoxic respiratory failure on 3 L supplemental oxygen at nighttime and with exertion, CKD stage IIIb, lymphedema, morbid obesity with BMI of 60 with wheelchair dependence, hypothyroidism, hypertension, who presents to the ED due to rectal bleeding    PT Comments  Pt seen this am with OT for pt and therapist safety while progressing functional mobility and transfers. Pt received in bariatric bed, 97% on 3L O2 at rest, agreeable to PT/OT. Session focused on bed mobility, sitting EOB with Supervision for 10+ minutes, bedside standing at RW with Min/ModA of 2. Pt able to side shuffle in standing towards [facility] with heavy support of RW. Limited due to lower leg discomfort, weakness, and large panis. Pt assisted back to bed, nursing notified of session, need for hygiene, and open skin areas in peri area. Will continue to progress as tolerated. ApO2 95-97% throughout session on 3L O2.   If plan is discharge home, recommend the following: Two people to help with bathing/dressing/bathroom;Direct supervision/assist for medications management;Direct supervision/assist for financial management;Assist for transportation;Help with stairs or ramp for entrance;Assistance with cooking/housework;Two people to help with walking and/or transfers   Can travel by private vehicle     No  Equipment Recommendations  Other (comment) (TBD at next level of care)    Recommendations for Other Services       Precautions / Restrictions Precautions Precautions: Fall Restrictions Weight Bearing Restrictions: No     Mobility  Bed Mobility Overal bed mobility: Needs Assistance Bed Mobility: Rolling, Supine to Sit, Sit to Supine Rolling: Mod assist, Max assist, +2 for physical  assistance (MaxA of 2 to roll to Right)   Supine to sit: Max assist, +2 for physical assistance, HOB elevated, Used rails Sit to supine: +2 for physical assistance, Total assist   General bed mobility comments:  (Pt able to assist with scooting up in bed in semi-bridge position with B feet secured for push off)    Transfers Overall transfer level: Needs assistance Equipment used: Rolling walker (2 wheels) Transfers: Sit to/from Stand Sit to Stand: Min assist, Mod assist, +2 physical assistance           General transfer comment: Improved ability to assist with bed mob and transfers    Ambulation/Gait Ambulation/Gait assistance: Mod assist, +2 physical assistance Gait Distance (Feet):  (No "true" gait, however pt able to shuffle feet to Left in standing to move towards [facility]) Assistive device: Rolling walker (2 wheels)         General Gait Details: Unable to formally attempt   Stairs             Wheelchair Mobility     Tilt Bed    Modified Rankin (Stroke Patients Only)       Balance Overall balance assessment: Needs assistance Sitting-balance support: Feet supported, Single extremity supported Sitting balance-Leahy Scale: Fair Sitting balance - Comments:  (Requires Supervision due to unsteadiness of air mattress and pt size)   Standing balance support: Bilateral upper extremity supported, Reliant on assistive device for balance Standing balance-Leahy Scale: Poor Standing balance comment: Pt able to stand at RW for ~20 seconds                            Cognition Arousal: Alert  Behavior During Therapy: WFL for tasks assessed/performed Overall Cognitive Status: Within Functional Limits for tasks assessed                                          Exercises      General Comments General comments (skin integrity, edema, etc.):  (Discussed pt's set up at home and transfer techniques. Pt educated on role of PT and benefits of  OOB activity.)      Pertinent Vitals/Pain Pain Assessment Pain Assessment: Faces Faces Pain Scale: Hurts little more Pain Location: L lower leg Pain Descriptors / Indicators: Aching Pain Intervention(s): Monitored during session    Home Living                          Prior Function            PT Goals (current goals can now be found in the care plan section) Acute Rehab PT Goals Patient Stated Goal: get moving well enough to go back home Progress towards PT goals: Progressing toward goals    Frequency    Min 1X/week      PT Plan      Co-evaluation PT/OT/SLP Co-Evaluation/Treatment: Yes Reason for Co-Treatment: For patient/therapist safety;Complexity of the patient's impairments (multi-system involvement) PT goals addressed during session: Mobility/safety with mobility;Balance        AM-PAC PT "6 Clicks" Mobility   Outcome Measure  Help needed turning from your back to your side while in a flat bed without using bedrails?: Total Help needed moving from lying on your back to sitting on the side of a flat bed without using bedrails?: A Lot Help needed moving to and from a bed to a chair (including a wheelchair)?: A Lot Help needed standing up from a chair using your arms (e.g., wheelchair or bedside chair)?: Total Help needed to walk in hospital room?: Total Help needed climbing 3-5 steps with a railing? : Total 6 Click Score: 8    End of Session Equipment Utilized During Treatment: Gait belt;Oxygen Activity Tolerance: Patient tolerated treatment well;Patient limited by fatigue Patient left: with call bell/phone within reach;in bed Nurse Communication: Mobility status;Other (comment) (Open skin areas under scrotum, +BM requiring assistance) PT Visit Diagnosis: Muscle weakness (generalized) (M62.81);Difficulty in walking, not elsewhere classified (R26.2);Pain Pain - Right/Left: Left Pain - part of body: Leg     Time: 1035-1101 PT Time Calculation  (min) (ACUTE ONLY): 26 min  Charges:    $Therapeutic Activity: 8-22 mins PT General Charges $$ ACUTE PT VISIT: 1 Visit                    Zadie Cleverly, PTA  [name] 01/09/2023, 12:02 PM

## 2023-01-09 NOTE — Discharge Summary (Incomplete)
Physician Discharge Summary  Patient: Jay Sandoval UEA:[id number] DOB: [birth year]   Code Status: Full Code Admit date: 01/04/2023 Discharge date: 01/10/2023 Disposition: Skilled nursing facility, PT, OT, nurse aid, and RN PCP: Jay Reichmann, MD  Recommendations for Outpatient Follow-up:  Follow up with PCP within 1-2 weeks Regarding general hospital follow up and preventative care Follow up with GI for routine colonoscopy for cancer screening  Discharge Diagnoses:  Principal Problem:   Lower GI bleed Active Problems:   Congestive heart failure (HCC)   Chronic respiratory failure with hypoxia (HCC)   COPD (chronic obstructive pulmonary disease) (HCC)   Sleep apnea   Morbid obesity (HCC)   Lymphedema   Hypertension   Physical deconditioning  Brief Hospital Course Summary: Jay Sandoval is a 82 y.o. male with a PMH significant for diverticulosis, HFpEF, OSA on CPAP, COPD, chronic hypoxic respiratory failure on 3 L supplemental oxygen at nighttime and with exertion, CKD stage IIIb, lymphedema, morbid obesity with BMI of 60 with wheelchair dependence, hypothyroidism, hypertension.   They presented from home to the ED on 01/04/2023 with rectal bleeding x 1 days. Denies abdominal pain. Has history of diverticulosis.    In the ED, it was found that they were normotensive at 123/65 with heart rate of 65. He was saturating at 93% on room air. He was afebrile 97.9 .  Significant findings included normal hemoglobin at 14.3, platelets of 127, potassium 3.8, glucose 100, creatinine 1.04 with GFR above 60. Bedside hemoccult stool test was positive.    They were initially treated with lasix.  They were admitted for concern that he may have a diverticular bleed.    Patient was admitted to medicine service for further workup and management of rectal bleeding as outlined in detail below.  GI was consulted and given that patient was stable with no signs of acute rectal bleeding evidenced by  normal hgb that remained stable and no further bleeding, he did not undergo inpatient treatment.  Bleeding likely a self-limited diverticular bleed vs hemorrhoid.    01/09/23 -stable. Evaluated by PT who recommended SNF due to worsening from baseline. Patient is agreeable. He is hemodynamically stable with normal hemoglobin that remains stable and no further known bleeding.  He will discharge to SNF today.  Recommend nightly CPAP wear.   Discharge Condition: Stable, improved Recommended discharge diet: Diabetic diet  Consultations: None  Procedures/Studies: None   Allergies as of 01/10/2023   No Known Allergies      Medication List     STOP taking these medications    amLODipine-atorvastatin 5-10 MG tablet Commonly known as: CADUET   ascorbic acid 500 MG tablet Commonly known as: VITAMIN C   pyridOXINE 100 MG tablet Commonly known as: VITAMIN B6   senna-docusate 8.6-50 MG tablet Commonly known as: Senokot-S   tiZANidine 4 MG tablet Commonly known as: ZANAFLEX   traMADol 50 MG tablet Commonly known as: ULTRAM       TAKE these medications    albuterol 108 (90 Base) MCG/ACT inhaler Commonly known as: VENTOLIN HFA Inhale 2 puffs into the lungs every 6 (six) hours as needed for wheezing or shortness of breath.   albuterol (2.5 MG/3ML) 0.083% nebulizer solution Commonly known as: PROVENTIL Take 2.5 mg by nebulization every 6 (six) hours as needed.   allopurinol 100 MG tablet Commonly known as: ZYLOPRIM Take 200 mg by mouth daily.   aspirin EC 81 MG tablet Take 81 mg by mouth daily. Swallow whole.   atenolol  50 MG tablet Commonly known as: TENORMIN Take 50 mg by mouth daily.   atorvastatin 10 MG tablet Commonly known as: LIPITOR Take 1 tablet (10 mg total) by mouth daily. Start taking on: January 11, 2023   benazepril 40 MG tablet Commonly known as: LOTENSIN Take 40 mg by mouth daily.   Coenzyme Q10 100 MG capsule Take 100 mg by mouth 2 (two)  times daily.   DULoxetine 60 MG capsule Commonly known as: CYMBALTA Take 60 mg by mouth daily.   fluticasone 50 MCG/ACT nasal spray Commonly known as: FLONASE Place 2 sprays into both nostrils daily as needed.   levothyroxine 25 MCG tablet Commonly known as: SYNTHROID Take 25 mcg by mouth daily before breakfast.   meloxicam 7.5 MG tablet Commonly known as: MOBIC Take 1 tablet (7.5 mg total) by mouth daily as needed for pain. What changed:  when to take this reasons to take this   metolazone 2.5 MG tablet Commonly known as: ZAROXOLYN Take 2.5 mg by mouth every 7 (seven) days.   MULTIVITAL PO Take 1 tablet by mouth daily.   omega-3 acid ethyl esters 1 g capsule Commonly known as: LOVAZA Take 2 capsules by mouth 2 (two) times daily.   potassium chloride SA 20 MEQ tablet Commonly known as: KLOR-CON M Take 20 mEq by mouth daily.   pregabalin 100 MG capsule Commonly known as: LYRICA Take 1 capsule (100 mg total) by mouth 2 (two) times daily.   Restasis 0.05 % ophthalmic emulsion Generic drug: cycloSPORINE Place 1 drop into both eyes 2 (two) times daily.   Symbicort 160-4.5 MCG/ACT inhaler Generic drug: budesonide-formoterol Inhale 2 puffs into the lungs 2 (two) times daily.   Torsemide 40 MG Tabs Take 40 mg by mouth daily. What changed:  medication strength how much to take   Wegovy 0.5 MG/0.5ML Soaj Generic drug: Semaglutide-Weight Management Inject 0.5 mg into the skin once a week. Saturday               Durable Medical Equipment  (From admission, onward)           Start     Ordered   01/09/23 1425  For home use only DME continuous positive airway pressure (CPAP)  Once       Question Answer Comment  Length of Need 6 Months   Patient has OSA or probable OSA Yes   Settings Autotitration   CPAP supplies needed Mask, headgear, cushions, filters, heated tubing and water chamber      01/09/23 1424            Contact information for  after-discharge care     Destination     [facility] .   Service: Skilled Nursing Contact information: [address] [place] [address] [phone]                    Subjective   Pt reports no concerns today. Denies further rectal bleeding.   All questions and concerns were addressed at time of discharge.  Objective  Blood pressure (!) 118/59, pulse 69, temperature 98.4 F (36.9 C), temperature source Oral, resp. rate 18, height 5\' 8"  (1.727 m), weight (!) 173.4 kg, SpO2 98%.   General: Pt is alert, awake, not in acute distress Cardiovascular: RRR, S1/S2 +, no rubs, no gallops Respiratory: CTA bilaterally, no wheezing, no rhonchi Abdominal: Soft, NT, ND, bowel sounds + Extremities: no edema, no cyanosis  The results of significant diagnostics from this  hospitalization (including imaging, microbiology, ancillary and laboratory) are listed below for reference.   Imaging studies: CT ABDOMEN PELVIS WO CONTRAST  Result Date: 12/20/2022 CLINICAL DATA:  Abdominal distention and pain EXAM: CT ABDOMEN AND PELVIS WITHOUT CONTRAST TECHNIQUE: Multidetector CT imaging of the abdomen and pelvis was performed following the standard protocol without IV contrast. RADIATION DOSE REDUCTION: This exam was performed according to the departmental dose-optimization program which includes automated exposure control, adjustment of the mA and/or kV according to patient size and/or use of iterative reconstruction technique. COMPARISON:  CT 07/09/2020, CT angiogram FINDINGS: Lower chest: Patient is tilted in the scanner towards the left. There is some linear opacity lung bases likely scar or atelectasis. No pleural effusion. Slight motion. Coronary artery calcifications trace pericardial fluid Hepatobiliary: On this non IV contrast exam, the liver has a grossly preserved parenchyma. Gallbladder is not seen. Pancreas: Unremarkable. No pancreatic ductal dilatation or  surrounding inflammatory changes. Spleen: Normal in size without focal abnormality. Adrenals/Urinary Tract: Single right and 2 left adrenal nodules. These are not all clearly adenomas on this noncontrast examination but are unchanged from the previous examination. By report this has been present since 2010 no abnormal calcifications are seen within either kidney nor along the course of either ureter. No collecting system dilatation. Preserved contours of the urinary bladder. Stomach/Bowel: Large bowel is of normal course and caliber with scattered stool on this non oral contrast exam. Normal appendix stomach and small bowel are nondilated. Few colonic diverticula. Vascular/Lymphatic: Aortic atherosclerosis. No enlarged abdominal or pelvic lymph nodes. Reproductive: Prostate is unremarkable. Other: No free air or free fluid. The entirety of the anterior abdomen is not included in the imaging field due to soft tissue and field-of-view. There appears to be a fat containing umbilical hernia. There is significant subcutaneous fat stranding and skin thickening again not completely included in the imaging field for the exam. Please correlate for any soft tissue infection or cellulitis. No obvious soft tissue gas Musculoskeletal: Moderate degenerative changes along the spine. Multilevel areas of bridging osteophytes, syndesmophytes and vertebral body fusion degenerative changes of the pelvis. IMPRESSION: No bowel obstruction, free air or free fluid. Scattered stool. Normal appendix. Bilateral adrenal nodules. This has been present since at least 2010 by prior reports. Portions of the anterior pelvic and abdominal wall not included in the imaging field due to field-of-view. There is skin thickening and subcutaneous fat stranding in the visualized portions of the anterior abdominal and pelvic wall. Please correlate for any clinical signs of infection or other process. Patient is tilted in the scanner. Electronically Signed    By: Karen Kays M.D.   On: 12/20/2022 15:08    Labs: Basic Metabolic Panel: Recent Labs  Lab 01/04/23 1234 01/05/23 0251 01/08/23 0520  NA 139 138 142  K 3.8 3.0* 3.4*  CL 100 94* 96*  CO2 28 31 35*  GLUCOSE 100* 91 114*  BUN 14 14 33*  CREATININE 1.04 1.09 1.16  CALCIUM 9.3 9.5 9.5  MG  --  2.0  --    CBC: Recent Labs  Lab 01/04/23 1234 01/05/23 0251 01/06/23 0340  WBC 4.3 5.2 6.1  HGB 14.3 14.5 14.1  HCT 43.8 43.1 41.3  MCV 94.8 93.1 92.2  PLT 127* 128* 125*   Microbiology: Results for orders placed or performed during the hospital encounter of 07/09/20  Resp Panel by RT-PCR (Flu A&B, Covid) Nasopharyngeal Swab     Status: None   Collection Time: 07/09/20  2:30 PM  Specimen: Nasopharyngeal Swab; Nasopharyngeal(NP) swabs in vial transport medium  Result Value Ref Range Status   SARS Coronavirus 2 by RT PCR NEGATIVE NEGATIVE Final    Comment: (NOTE) SARS-CoV-2 target nucleic acids are NOT DETECTED.  The SARS-CoV-2 RNA is generally detectable in upper respiratory specimens during the acute phase of infection. The lowest concentration of SARS-CoV-2 viral copies this assay can detect is 138 copies/mL. A negative result does not preclude SARS-Cov-2 infection and should not be used as the sole basis for treatment or other patient management decisions. A negative result may occur with  improper specimen collection/handling, submission of specimen other than nasopharyngeal swab, presence of viral mutation(s) within the areas targeted by this assay, and inadequate number of viral copies(<138 copies/mL). A negative result must be combined with clinical observations, patient history, and epidemiological information. The expected result is Negative.  Fact Sheet for Patients:  BloggerCourse.com  Fact Sheet for Healthcare Providers:  SeriousBroker.it  This test is no t yet approved or cleared by the [place] FDA  and  has been authorized for detection and/or diagnosis of SARS-CoV-2 by FDA under an Emergency Use Authorization (EUA). This EUA will remain  in effect (meaning this test can be used) for the duration of the COVID-19 declaration under Section 564(b)(1) of the Act, 21 U.S.C.section 360bbb-3(b)(1), unless the authorization is terminated  or revoked sooner.       Influenza A by PCR NEGATIVE NEGATIVE Final   Influenza B by PCR NEGATIVE NEGATIVE Final    Comment: (NOTE) The Xpert Xpress SARS-CoV-2/FLU/RSV plus assay is intended as an aid in the diagnosis of influenza from Nasopharyngeal swab specimens and should not be used as a sole basis for treatment. Nasal washings and aspirates are unacceptable for Xpert Xpress SARS-CoV-2/FLU/RSV testing.  Fact Sheet for Patients: BloggerCourse.com  Fact Sheet for Healthcare Providers: SeriousBroker.it  This test is not yet approved or cleared by the [place] FDA and has been authorized for detection and/or diagnosis of SARS-CoV-2 by FDA under an Emergency Use Authorization (EUA). This EUA will remain in effect (meaning this test can be used) for the duration of the COVID-19 declaration under Section 564(b)(1) of the Act, 21 U.S.C. section 360bbb-3(b)(1), unless the authorization is terminated or revoked.  Performed at [facility], [address], [place], [place] [address]    Time coordinating discharge: Over 30 minutes  Leeroy Bock, MD  [facility] 01/10/2023, 2:55 PM

## 2023-01-09 NOTE — Plan of Care (Signed)
Problem: Education: Goal: Knowledge of General Education information will improve Description: Including pain rating scale, medication(s)/side effects and non-pharmacologic comfort measures Outcome: Progressing   Problem: Clinical Measurements: Goal: Ability to maintain clinical measurements within normal limits will improve Outcome: Progressing Goal: Diagnostic test results will improve Outcome: Progressing

## 2023-01-09 NOTE — Progress Notes (Signed)
Occupational Therapy Treatment Patient Details Name: Jay Sandoval MRN: [id number] DOB: 1940/09/10 Today's Date: 01/09/2023   History of present illness 82 y.o. male with medical history significant of diverticulosis, HFpEF, OSA on CPAP, COPD, chronic hypoxic respiratory failure on 3 L supplemental oxygen at nighttime and with exertion, CKD stage IIIb, lymphedema, morbid obesity with BMI of 60 with wheelchair dependence, hypothyroidism, hypertension, who presents to the ED due to rectal bleeding   OT comments  Pt seen for OT tx and co-tx with PT this morning to address ADL and mobility. Pt agreeable, denies complaints. Pt required significant assist for bed mobility (MAX A +2 for rolling to R side, MOD A +2 for rolling to the L side 2/2 body habitus and weakness). Once EOB pt tolerated seated grooming tasks with set up and supv for safety. Pt engaged in ADL transfer training requiring MIN-MOD A +2 for repeated STS transfers with brief rest breaks in between and VC for sequencing. Pt progressing towards goals and continues to benefit from skilled OT services.       If plan is discharge home, recommend the following:  Two people to help with walking and/or transfers;A lot of help with bathing/dressing/bathroom;Assistance with cooking/housework;Help with stairs or ramp for entrance;Assist for transportation   Equipment Recommendations  Other (comment) (defer to next venue)    Recommendations for Other Services      Precautions / Restrictions Precautions Precautions: Fall Restrictions Weight Bearing Restrictions: No       Mobility Bed Mobility Overal bed mobility: Needs Assistance Bed Mobility: Rolling, Supine to Sit, Sit to Supine Rolling: Mod assist, Max assist, +2 for physical assistance   Supine to sit: Max assist, +2 for physical assistance, HOB elevated, Used rails Sit to supine: +2 for physical assistance, Total assist        Transfers Overall transfer level: Needs  assistance Equipment used: Rolling walker (2 wheels) Transfers: Sit to/from Stand Sit to Stand: Min assist, Mod assist, +2 physical assistance                 Balance Overall balance assessment: Needs assistance   Sitting balance-Leahy Scale: Fair     Standing balance support: Bilateral upper extremity supported, Reliant on assistive device for balance Standing balance-Leahy Scale: Poor Standing balance comment: Pt able to stand at [facility] for ~20 seconds                           ADL either performed or assessed with clinical judgement   ADL Overall ADL's : Needs assistance/impaired     Grooming: Sitting;Set up;Wash/dry face;Oral care   Upper Body Bathing: Sitting;Moderate assistance Upper Body Bathing Details (indicate cue type and reason): MOD A for washing back               Toilet Transfer Details (indicate cue type and reason): log rolling side to side requiring MAX A +2 to R side, MOD A +2 to L side Toileting- Clothing Manipulation and Hygiene: Bed level;Total assistance;+2 for safety/equipment Toileting - Clothing Manipulation Details (indicate cue type and reason): +2 for maintaining sidelying            Extremity/Trunk Assessment              Vision       Perception     Praxis      Cognition Arousal: Alert Behavior During Therapy: WFL for tasks assessed/performed Overall Cognitive Status: Within Functional Limits for tasks assessed  Exercises      Shoulder Instructions       General Comments  (Discussed pt's set up at home and transfer techniques. Pt educated on role of PT and benefits of OOB activity.)    Pertinent Vitals/ Pain       Pain Assessment Pain Assessment: Faces Pain Score: 8  Pain Location: L lower leg Pain Descriptors / Indicators: Aching, Moaning Pain Intervention(s): Limited activity within patient's tolerance, Monitored during session,  Repositioned  Home Living                                          Prior Functioning/Environment              Frequency  Min 1X/week        Progress Toward Goals  OT Goals(current goals can now be found in the care plan section)  Progress towards OT goals: Progressing toward goals  Acute Rehab OT Goals Patient Stated Goal: get stronger so I can go home OT Goal Formulation: With patient Time For Goal Achievement: 01/19/23 Potential to Achieve Goals: Good  Plan      Co-evaluation    PT/OT/SLP Co-Evaluation/Treatment: Yes Reason for Co-Treatment: For patient/therapist safety;Complexity of the patient's impairments (multi-system involvement) PT goals addressed during session: Mobility/safety with mobility;Balance;Proper use of DME OT goals addressed during session: ADL's and self-care;Proper use of Adaptive equipment and DME      AM-PAC OT "6 Clicks" Daily Activity     Outcome Measure   Help from another person eating meals?: None Help from another person taking care of personal grooming?: A Little Help from another person toileting, which includes using toliet, bedpan, or urinal?: Total Help from another person bathing (including washing, rinsing, drying)?: A Lot Help from another person to put on and taking off regular upper body clothing?: A Lot Help from another person to put on and taking off regular lower body clothing?: A Lot 6 Click Score: 14    End of Session Equipment Utilized During Treatment: Rolling walker (2 wheels);Gait belt  OT Visit Diagnosis: Other abnormalities of gait and mobility (R26.89);Muscle weakness (generalized) (M62.81)   Activity Tolerance Patient tolerated treatment well   Patient Left in bed;with call bell/phone within reach;with bed alarm set   Nurse Communication Mobility status;Other (comment) (a couple spots of skin breakdown under scrotum and above pubic bone; needs further assist for completing hygiene)         Time: 1031-1101 OT Time Calculation (min): 30 min  Charges: OT General Charges $OT Visit: 1 Visit OT Treatments $Self Care/Home Management : 8-22 mins  Arman Filter., MPH, MS, OTR/L ascom [phone] 01/09/23, 2:15 PM

## 2023-01-10 DIAGNOSIS — K922 Gastrointestinal hemorrhage, unspecified: Secondary | ICD-10-CM | POA: Diagnosis not present

## 2023-01-10 DIAGNOSIS — K625 Hemorrhage of anus and rectum: Secondary | ICD-10-CM | POA: Diagnosis not present

## 2023-01-10 MED ORDER — MELOXICAM 7.5 MG PO TABS: 7.5000 mg | ORAL_TABLET | Freq: Every day | ORAL | Status: DC | PRN

## 2023-01-10 MED ORDER — TORSEMIDE 40 MG PO TABS: 40.0000 mg | ORAL_TABLET | Freq: Every day | ORAL | Status: DC

## 2023-01-10 MED ORDER — TRAMADOL HCL 50 MG PO TABS: 50.0000 mg | ORAL_TABLET | Freq: Two times a day (BID) | ORAL | Status: DC | PRN

## 2023-01-10 MED ORDER — PREGABALIN 100 MG PO CAPS: 100.0000 mg | ORAL_CAPSULE | Freq: Two times a day (BID) | ORAL | 0 refills | Status: AC

## 2023-01-10 MED ORDER — ATORVASTATIN CALCIUM 10 MG PO TABS: 10.0000 mg | ORAL_TABLET | Freq: Every day | ORAL | Status: DC

## 2023-01-10 MED ORDER — TRAMADOL HCL 50 MG PO TABS
50.0000 mg | ORAL_TABLET | Freq: Two times a day (BID) | ORAL | Status: DC | PRN
  Filled 2023-01-10: qty 1

## 2023-01-10 NOTE — Progress Notes (Signed)
Patient being discharged to [facility]. No PIVs noted on assessment to remove. Called report [facility] and gave report to nurse Crystal. All paperwork in discharge packet. Patient will be transported via EMS.

## 2023-01-10 NOTE — TOC Transition Note (Signed)
Transition of Care [facility]) - CM/SW Discharge Note   Patient Details  Name: Jay Sandoval MRN: [id number] Date of Birth: [birth year]  Transition of Care [facility]) CM/SW Contact:  Garret Reddish, RN Phone Number: 01/10/2023, 2:15 PM   Clinical Narrative:     Chart reviewed.  Patient will be a discharge for today.    I have spoken with Kenney Houseman, with [facility] and she informs me that she has received the bariatric bed and CPAP for the patient.  I have sent Discharge summary, Discharge orders, and SNF Transfer report via the Epic hub.  Kenney Houseman reports that patient will be going to room 39 and to call report to [phone].  I have informed patient that [facility] will have a bed for him today.  I have informed [name] that I will arranged transport with [facility] for today.    I have informed staff nurse of the above information.        Final next level of care: Skilled Nursing Facility Barriers to Discharge: No Barriers Identified   Patient Goals and CMS Choice CMS Medicare.gov Compare Post Acute Care list provided to:: Patient Choice offered to / list presented to : Patient  Discharge Placement                Patient chooses bed at: [facility] Patient to be transferred to facility by: [facility] Name of family member notified: Patient informs me that he will notify his niece Patient and family notified of of transfer: 01/10/23  Discharge Plan and Services Additional resources added to the After Visit Summary for                                       Social Determinants of Health (SDOH) Interventions SDOH Screenings   Food Insecurity: Unknown (01/04/2023)  Housing: Low Risk  (01/04/2023)  Transportation Needs: No Transportation Needs (01/04/2023)  Utilities: Not At Risk (01/04/2023)  Financial Resource Strain: Low Risk  (12/20/2022)   Received from [facility]  Social Connections: Unknown  (09/07/2021)   Received from [facility]  Tobacco Use: Medium Risk (01/04/2023)     Readmission Risk Interventions     No data to display

## 2023-01-10 NOTE — Progress Notes (Incomplete)
PROGRESS NOTE  Jay Sandoval    DOB: 10-24-1940, 82 y.o.  JYN:[id number]    Code Status: Full Code   DOA: 01/04/2023   LOS: 0   Brief hospital course  Jay Sandoval is a 82 y.o. male with a PMH significant for diverticulosis, HFpEF, OSA on CPAP, COPD, chronic hypoxic respiratory failure on 3 L supplemental oxygen at nighttime and with exertion, CKD stage IIIb, lymphedema, morbid obesity with BMI of 60 with wheelchair dependence, hypothyroidism, hypertension.  They presented from home to the ED on 01/04/2023 with rectal bleeding x 1 days. Denies abdominal pain. Has history of diverticulosis.   In the ED, it was found that they were normotensive at 123/65 with heart rate of 65. He was saturating at 93% on room air. He was afebrile 97.9 .  Significant findings included normal hemoglobin at 14.3, platelets of 127, potassium 3.8, glucose 100, creatinine 1.04 with GFR above 60. Bedside hemoccult stool test was positive.   They were initially treated with lasix.  They were admitted for concern that he may have a diverticular bleed.   Patient was admitted to medicine service for further workup and management of rectal bleeding as outlined in detail below.  01/10/23 -stable. Evaluated by PT who recommended SNF due to worsening from baseline. Patient is agreeable. TOC is engaged for placement. He is hemodynamically stable with normal hemoglobin that remains stable and no further known bleeding. He remains stable and can discharge to SNF when a bed is available. Awaiting insurance auth.  Assessment & Plan  Principal Problem:   Lower GI bleed Active Problems:   Congestive heart failure (HCC)   Chronic respiratory failure with hypoxia (HCC)   COPD (chronic obstructive pulmonary disease) (HCC)   Sleep apnea   Morbid obesity (HCC)   Lymphedema   Hypertension   Physical deconditioning  rectal bleed- hemoccult positive on admission. No further bleeding. Has history of diverticular bleed and  colonoscopy March 2022. No abdominal pain. Likely repeat of diverticular bleed and/or hemorrhoids. Hgb remains within normal levels and even increased after admission so doubt there is a significant bleed. Hgb 14.3>14.5>14.1. Medically ready for discharge. - Hold home aspirin for now - GI consult if demonstrates active bleeding  Mobility decline- patient feels well but has decline in his independence to transfer to his power chair. Wheelchair bound at baseline. PT evaluated and recommending SNF. Patient is agreeable.  - continue PT/OT - TOC consulted for placement.    HFpEF- last echocardiogram in 2017 demonstrating EF above 55%.  Myoview around the same time demonstrated small defect in the apical anterior location consistent with prior MI.  There is no evidence of pulmonary edema, however peripheral edema is present.   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 - Continue home 3 L at bedtime and with exertion - Continue home bronchodilators   Sleep apnea - CPAP at bedtime   Morbid obesity (HCC) Patient has a history of morbid obesity with a BMI of 60.7.  Complicated by OSA, HFpEF, hypertension.  Additionally, patient is wheelchair-bound secondary to weight related complications.   Hypertension - Resume home antihypertensives - stopped amlodipine for relative hypotension   Hypokalemia- K+ 3.0 - monitor and replete PRN   Lymphedema - SCDs ordered  Body mass index is 58.13 kg/m.  VTE ppx: SCDs Start: 01/04/23 1538. Starting lovenox  Diet:     Diet   Diet Heart Room service appropriate? Yes; Fluid consistency: Thin   Consultants: None   Subjective 01/10/23    Pt reports no complaints today. He is patiently awaiting transfer to  SNF.    Objective   Vitals:   01/09/23 1626 01/09/23 2112 01/10/23 0051 01/10/23 0523  BP: 132/68 (!) 124/40 (!) 122/54 (!) 132/56  Pulse: 66 71 72 75  Resp: 19 20  20   Temp: 97.6 F (36.4 C) 98.1 F (36.7 C)  97.6 F (36.4 C)  TempSrc: Oral Oral  Oral  SpO2: 97% 97%  92%  Weight:      Height:        Intake/Output Summary (Last 24 hours) at 01/10/2023 0725 Last data filed at 01/09/2023 2112 Gross per 24 hour  Intake 720 ml  Output 1000 ml  Net -280 ml    Filed Weights   01/04/23 1209 01/05/23 0500  Weight: (!) 181 kg (!) 173.4 kg    Physical Exam:  General: awake, alert, NAD. Severely obese HEENT: atraumatic, clear conjunctiva, anicteric sclera, MMM, hearing grossly normal Respiratory: normal respiratory effort. Cardiovascular: quick capillary refill, normal S1/S2, RRR, no JVD, murmurs Gastrointestinal: soft, NT, ND Nervous: A&O x3. no gross focal neurologic deficits, normal speech Extremities: non-pitting edema vs lymphedema in limbs.  Skin: dry, intact, normal temperature, normal color. No rashes, lesions or ulcers on exposed skin Psychiatry: normal mood, congruent affect  Labs   I have personally reviewed the following labs and imaging studies CBC    Component Value Date/Time   WBC 6.1 01/06/2023 0340   RBC 4.48 01/06/2023 0340   HGB 14.1 01/06/2023 0340   HGB 14.2 08/10/2020 1411   HCT 41.3 01/06/2023 0340   PLT 125 (L) 01/06/2023 0340   MCV 92.2 01/06/2023 0340   MCH 31.5 01/06/2023 0340   MCHC 34.1 01/06/2023 0340   RDW 13.4 01/06/2023 0340      Latest Ref Rng & Units 01/08/2023    5:20 AM 01/05/2023    2:51 AM 01/04/2023   12:34 PM  BMP  Glucose 70 - 99 mg/dL 295  91  621   BUN 8 - 23 mg/dL 33  14  14   Creatinine 0.61 - 1.24 mg/dL 3.08  6.57  8.46   Sodium 135 - 145 mmol/L 142  138  139   Potassium 3.5 - 5.1 mmol/L 3.4  3.0  3.8   Chloride 98 - 111 mmol/L 96  94  100   CO2 22 - 32 mmol/L 35  31  28   Calcium 8.9 - 10.3 mg/dL 9.5  9.5  9.3      No results found.  Disposition Plan & Communication  Patient status: Observation  Admitted From: Home Planned disposition location: Skilled nursing facility Anticipated discharge date: 9/16 pending SNF placement  Family Communication: none at bedside     Author: Leeroy Bock, DO [facility] 01/10/2023, 7:25 AM   Available by Epic secure chat 7AM-7PM. If 7PM-7AM, please contact night-coverage.  [facility] contact information found on [email or web address].

## 2023-01-10 NOTE — Progress Notes (Signed)
Pt discharged to [facility] via EMS. Patient discharged with personal belongings. NO PIVs noted on assessment to be removed.

## 2023-01-10 NOTE — Progress Notes (Signed)
Physical Therapy Treatment Patient Details Name: Jay Sandoval MRN: [id number] DOB: 1941-03-16 Today's Date: 01/10/2023   History of Present Illness 82 y.o. male with medical history significant of diverticulosis, HFpEF, OSA on CPAP, COPD, chronic hypoxic respiratory failure on 3 L supplemental oxygen at nighttime and with exertion, CKD stage IIIb, lymphedema, morbid obesity with BMI of 60 with wheelchair dependence, hypothyroidism, hypertension, who presents to the ED due to rectal bleeding    PT Comments  Pt received in bed and agreeable to PT session. MaxA of 2 to transfer to EOB. Sitting EOB for 30+ minutes while focusing on sit<>stand transfers at [facility] with Mod of 1 and Min of 1. Pt able to stand for 30 seconds at a time. With Min/ModA of 2 to maintain and tactile cues to encourage upright posture. Overall great tolerance and remains very motivated to return to previous ability to transfer from electric w/c<>bed. Pt assisted back to bed with all needs met, nursing to assist with OOB via hoyer lift.    If plan is discharge home, recommend the following: Two people to help with bathing/dressing/bathroom;Direct supervision/assist for medications management;Direct supervision/assist for financial management;Assist for transportation;Help with stairs or ramp for entrance;Assistance with cooking/housework;Two people to help with walking and/or transfers   Can travel by private vehicle     No  Equipment Recommendations  Other (comment) (TBD at next level of care)    Recommendations for Other Services       Precautions / Restrictions Precautions Precautions: Fall Restrictions Weight Bearing Restrictions: No     Mobility  Bed Mobility Overal bed mobility: Needs Assistance Bed Mobility: Rolling, Supine to Sit, Sit to Supine Rolling: Mod assist, Max assist, +2 for physical assistance   Supine to sit: Max assist, +2 for physical assistance, HOB elevated, Used rails Sit to supine: +2 for  physical assistance, Total assist   General bed mobility comments: Struggling with bed mobility, total assist for repositioning and adjustments.    Transfers Overall transfer level: Needs assistance Equipment used: Rolling walker (2 wheels) Transfers: Sit to/from Stand Sit to Stand: Min assist, Mod assist, +2 physical assistance           General transfer comment: Improved ability to assist with bed mob and transfers    Ambulation/Gait Ambulation/Gait assistance: Mod assist, +2 physical assistance   Assistive device: Rolling walker (2 wheels)         General Gait Details: Unable to formally attempt, able to shuffle feet in standing to move towards HOB.   Stairs             Wheelchair Mobility     Tilt Bed    Modified Rankin (Stroke Patients Only)       Balance Overall balance assessment: Needs assistance Sitting-balance support: Feet supported, Single extremity supported Sitting balance-Leahy Scale: Fair     Standing balance support: Bilateral upper extremity supported, Reliant on assistive device for balance Standing balance-Leahy Scale: Poor Standing balance comment: Pt able to stand at RW for 30 seconds three times                            Cognition Arousal: Alert Behavior During Therapy: [facility] for tasks assessed/performed Overall Cognitive Status: Within Functional Limits for tasks assessed                                 General Comments: Pleasant  and cooperative        Exercises General Exercises - Lower Extremity Ankle Circles/Pumps: AROM, Both, 10 reps, Seated (modified) Long Arc Quad: AAROM, Both, 10 reps, Seated Toe Raises: AROM, Both, 10 reps, Seated Other Exercises Other Exercises: Pt educated on PLB technique with good teach back understanding    General Comments General comments (skin integrity, edema, etc.):  (Large panis primarily hanging down to the left abdominal area, skin breakdown behind  scrotum, nursing aware')      Pertinent Vitals/Pain Pain Assessment Pain Assessment: Faces Faces Pain Scale: Hurts whole lot Pain Location: L lower leg Pain Descriptors / Indicators: Aching, Moaning Pain Intervention(s): Limited activity within patient's tolerance    Home Living                          Prior Function            PT Goals (current goals can now be found in the care plan section) Acute Rehab PT Goals Patient Stated Goal: get moving well enough to go back home Progress towards PT goals: Progressing toward goals    Frequency    Min 1X/week      PT Plan      Co-evaluation              AM-PAC PT "6 Clicks" Mobility   Outcome Measure  Help needed turning from your back to your side while in a flat bed without using bedrails?: Total Help needed moving from lying on your back to sitting on the side of a flat bed without using bedrails?: A Lot Help needed moving to and from a bed to a chair (including a wheelchair)?: A Lot Help needed standing up from a chair using your arms (e.g., wheelchair or bedside chair)?: Total Help needed to walk in hospital room?: Total Help needed climbing 3-5 steps with a railing? : Total 6 Click Score: 8    End of Session Equipment Utilized During Treatment: Gait belt;Oxygen Activity Tolerance: Patient tolerated treatment well;Patient limited by fatigue Patient left: with call bell/phone within reach;in bed Nurse Communication: Mobility status;Other (comment) PT Visit Diagnosis: Muscle weakness (generalized) (M62.81);Difficulty in walking, not elsewhere classified (R26.2);Pain Pain - Right/Left: Left Pain - part of body: Leg     Time: 0865-7846 PT Time Calculation (min) (ACUTE ONLY): 41 min  Charges:    $Therapeutic Exercise: 8-22 mins $Therapeutic Activity: 23-37 mins PT General Charges $$ ACUTE PT VISIT: 1 Visit                    Zadie Cleverly, PTA  [name] 01/10/2023, 2:28 PM

## 2023-02-09 ENCOUNTER — Emergency Department: Payer: 59

## 2023-02-09 ENCOUNTER — Inpatient Hospital Stay
Admission: EM | Admit: 2023-02-09 | Discharge: 2023-02-24 | DRG: 870 | Disposition: E | Payer: 59 | Attending: Student in an Organized Health Care Education/Training Program | Admitting: Student in an Organized Health Care Education/Training Program

## 2023-02-09 ENCOUNTER — Inpatient Hospital Stay: Payer: 59

## 2023-02-09 ENCOUNTER — Other Ambulatory Visit: Payer: Self-pay

## 2023-02-09 DIAGNOSIS — A419 Sepsis, unspecified organism: Secondary | ICD-10-CM

## 2023-02-09 DIAGNOSIS — E872 Acidosis, unspecified: Secondary | ICD-10-CM | POA: Diagnosis present

## 2023-02-09 DIAGNOSIS — R6521 Severe sepsis with septic shock: Secondary | ICD-10-CM | POA: Diagnosis present

## 2023-02-09 DIAGNOSIS — H409 Unspecified glaucoma: Secondary | ICD-10-CM | POA: Diagnosis present

## 2023-02-09 DIAGNOSIS — E875 Hyperkalemia: Secondary | ICD-10-CM | POA: Diagnosis present

## 2023-02-09 DIAGNOSIS — J9622 Acute and chronic respiratory failure with hypercapnia: Secondary | ICD-10-CM | POA: Diagnosis present

## 2023-02-09 DIAGNOSIS — J9382 Other air leak: Secondary | ICD-10-CM | POA: Diagnosis not present

## 2023-02-09 DIAGNOSIS — Z1612 Extended spectrum beta lactamase (ESBL) resistance: Secondary | ICD-10-CM | POA: Diagnosis present

## 2023-02-09 DIAGNOSIS — K219 Gastro-esophageal reflux disease without esophagitis: Secondary | ICD-10-CM | POA: Diagnosis present

## 2023-02-09 DIAGNOSIS — E039 Hypothyroidism, unspecified: Secondary | ICD-10-CM | POA: Diagnosis present

## 2023-02-09 DIAGNOSIS — E785 Hyperlipidemia, unspecified: Secondary | ICD-10-CM | POA: Diagnosis present

## 2023-02-09 DIAGNOSIS — Z515 Encounter for palliative care: Secondary | ICD-10-CM | POA: Diagnosis not present

## 2023-02-09 DIAGNOSIS — E1122 Type 2 diabetes mellitus with diabetic chronic kidney disease: Secondary | ICD-10-CM | POA: Diagnosis present

## 2023-02-09 DIAGNOSIS — R0603 Acute respiratory distress: Secondary | ICD-10-CM | POA: Diagnosis not present

## 2023-02-09 DIAGNOSIS — I5032 Chronic diastolic (congestive) heart failure: Secondary | ICD-10-CM | POA: Diagnosis present

## 2023-02-09 DIAGNOSIS — E876 Hypokalemia: Secondary | ICD-10-CM | POA: Diagnosis not present

## 2023-02-09 DIAGNOSIS — Z66 Do not resuscitate: Secondary | ICD-10-CM | POA: Diagnosis not present

## 2023-02-09 DIAGNOSIS — A4159 Other Gram-negative sepsis: Principal | ICD-10-CM | POA: Diagnosis present

## 2023-02-09 DIAGNOSIS — Z79890 Hormone replacement therapy (postmenopausal): Secondary | ICD-10-CM

## 2023-02-09 DIAGNOSIS — I130 Hypertensive heart and chronic kidney disease with heart failure and stage 1 through stage 4 chronic kidney disease, or unspecified chronic kidney disease: Secondary | ICD-10-CM | POA: Diagnosis present

## 2023-02-09 DIAGNOSIS — I4901 Ventricular fibrillation: Secondary | ICD-10-CM | POA: Diagnosis not present

## 2023-02-09 DIAGNOSIS — Z87891 Personal history of nicotine dependence: Secondary | ICD-10-CM

## 2023-02-09 DIAGNOSIS — Z1152 Encounter for screening for COVID-19: Secondary | ICD-10-CM | POA: Diagnosis not present

## 2023-02-09 DIAGNOSIS — J9601 Acute respiratory failure with hypoxia: Secondary | ICD-10-CM | POA: Diagnosis not present

## 2023-02-09 DIAGNOSIS — G9340 Encephalopathy, unspecified: Secondary | ICD-10-CM | POA: Insufficient documentation

## 2023-02-09 DIAGNOSIS — E870 Hyperosmolality and hypernatremia: Secondary | ICD-10-CM | POA: Diagnosis not present

## 2023-02-09 DIAGNOSIS — L409 Psoriasis, unspecified: Secondary | ICD-10-CM | POA: Diagnosis present

## 2023-02-09 DIAGNOSIS — A4152 Sepsis due to Pseudomonas: Secondary | ICD-10-CM | POA: Diagnosis not present

## 2023-02-09 DIAGNOSIS — Z7189 Other specified counseling: Secondary | ICD-10-CM | POA: Diagnosis not present

## 2023-02-09 DIAGNOSIS — J440 Chronic obstructive pulmonary disease with acute lower respiratory infection: Secondary | ICD-10-CM | POA: Diagnosis present

## 2023-02-09 DIAGNOSIS — E662 Morbid (severe) obesity with alveolar hypoventilation: Secondary | ICD-10-CM | POA: Diagnosis present

## 2023-02-09 DIAGNOSIS — E871 Hypo-osmolality and hyponatremia: Secondary | ICD-10-CM | POA: Diagnosis present

## 2023-02-09 DIAGNOSIS — J151 Pneumonia due to Pseudomonas: Secondary | ICD-10-CM | POA: Diagnosis not present

## 2023-02-09 DIAGNOSIS — K922 Gastrointestinal hemorrhage, unspecified: Secondary | ICD-10-CM | POA: Diagnosis present

## 2023-02-09 DIAGNOSIS — Z1624 Resistance to multiple antibiotics: Secondary | ICD-10-CM | POA: Diagnosis present

## 2023-02-09 DIAGNOSIS — J150 Pneumonia due to Klebsiella pneumoniae: Secondary | ICD-10-CM | POA: Diagnosis present

## 2023-02-09 DIAGNOSIS — Z9981 Dependence on supplemental oxygen: Secondary | ICD-10-CM

## 2023-02-09 DIAGNOSIS — I9589 Other hypotension: Secondary | ICD-10-CM | POA: Diagnosis not present

## 2023-02-09 DIAGNOSIS — G9341 Metabolic encephalopathy: Secondary | ICD-10-CM | POA: Diagnosis present

## 2023-02-09 DIAGNOSIS — Z7951 Long term (current) use of inhaled steroids: Secondary | ICD-10-CM

## 2023-02-09 DIAGNOSIS — R579 Shock, unspecified: Secondary | ICD-10-CM | POA: Insufficient documentation

## 2023-02-09 DIAGNOSIS — Z993 Dependence on wheelchair: Secondary | ICD-10-CM

## 2023-02-09 DIAGNOSIS — N184 Chronic kidney disease, stage 4 (severe): Secondary | ICD-10-CM | POA: Diagnosis present

## 2023-02-09 DIAGNOSIS — N179 Acute kidney failure, unspecified: Principal | ICD-10-CM

## 2023-02-09 DIAGNOSIS — M810 Age-related osteoporosis without current pathological fracture: Secondary | ICD-10-CM | POA: Diagnosis present

## 2023-02-09 DIAGNOSIS — E861 Hypovolemia: Secondary | ICD-10-CM | POA: Diagnosis not present

## 2023-02-09 DIAGNOSIS — N170 Acute kidney failure with tubular necrosis: Secondary | ICD-10-CM | POA: Diagnosis present

## 2023-02-09 DIAGNOSIS — R7401 Elevation of levels of liver transaminase levels: Secondary | ICD-10-CM | POA: Insufficient documentation

## 2023-02-09 DIAGNOSIS — J9621 Acute and chronic respiratory failure with hypoxia: Secondary | ICD-10-CM | POA: Diagnosis present

## 2023-02-09 DIAGNOSIS — Z7982 Long term (current) use of aspirin: Secondary | ICD-10-CM

## 2023-02-09 DIAGNOSIS — Z7401 Bed confinement status: Secondary | ICD-10-CM

## 2023-02-09 LAB — BLOOD GAS, ARTERIAL
Acid-base deficit: 3.3 mmol/L — ABNORMAL HIGH (ref 0.0–2.0)
Bicarbonate: 18.6 mmol/L — ABNORMAL LOW (ref 20.0–28.0)
FIO2: 40 %
MECHVT: 550 mL
Mechanical Rate: 20
O2 Saturation: 99.6 %
PEEP: 5 cmH2O
Patient temperature: 37
RATE: 20 {breaths}/min
pCO2 arterial: 25 mm[Hg] — ABNORMAL LOW (ref 32–48)
pH, Arterial: 7.48 — ABNORMAL HIGH (ref 7.35–7.45)
pO2, Arterial: 85 mm[Hg] (ref 83–108)

## 2023-02-09 LAB — URINALYSIS, W/ REFLEX TO CULTURE (INFECTION SUSPECTED)
Bilirubin Urine: NEGATIVE
Glucose, UA: NEGATIVE mg/dL
Ketones, ur: NEGATIVE mg/dL
Leukocytes,Ua: NEGATIVE
Nitrite: NEGATIVE
Protein, ur: NEGATIVE mg/dL
Specific Gravity, Urine: 1.016 (ref 1.005–1.030)
pH: 5 (ref 5.0–8.0)

## 2023-02-09 LAB — COMPREHENSIVE METABOLIC PANEL
ALT: 67 U/L — ABNORMAL HIGH (ref 0–44)
AST: 148 U/L — ABNORMAL HIGH (ref 15–41)
Albumin: 2.8 g/dL — ABNORMAL LOW (ref 3.5–5.0)
Alkaline Phosphatase: 46 U/L (ref 38–126)
Anion gap: 16 — ABNORMAL HIGH (ref 5–15)
BUN: 151 mg/dL — ABNORMAL HIGH (ref 8–23)
CO2: 24 mmol/L (ref 22–32)
Calcium: 9.1 mg/dL (ref 8.9–10.3)
Chloride: 99 mmol/L (ref 98–111)
Creatinine, Ser: 6.97 mg/dL — ABNORMAL HIGH (ref 0.61–1.24)
GFR, Estimated: 7 mL/min — ABNORMAL LOW (ref >60)
Glucose, Bld: 125 mg/dL — ABNORMAL HIGH (ref 70–99)
Potassium: 7.4 mmol/L (ref 3.5–5.1)
Sodium: 139 mmol/L (ref 135–145)
Total Bilirubin: 0.5 mg/dL (ref 0.3–1.2)
Total Protein: 6.7 g/dL (ref 6.5–8.1)

## 2023-02-09 LAB — BASIC METABOLIC PANEL
Anion gap: 18 — ABNORMAL HIGH (ref 5–15)
BUN: 145 mg/dL — ABNORMAL HIGH (ref 8–23)
CO2: 22 mmol/L (ref 22–32)
Calcium: 9.1 mg/dL (ref 8.9–10.3)
Chloride: 100 mmol/L (ref 98–111)
Creatinine, Ser: 6.68 mg/dL — ABNORMAL HIGH (ref 0.61–1.24)
GFR, Estimated: 8 mL/min — ABNORMAL LOW (ref >60)
Glucose, Bld: 147 mg/dL — ABNORMAL HIGH (ref 70–99)
Potassium: 5.6 mmol/L — ABNORMAL HIGH (ref 3.5–5.1)
Sodium: 140 mmol/L (ref 135–145)

## 2023-02-09 LAB — BLOOD GAS, VENOUS
Acid-base deficit: 4 mmol/L — ABNORMAL HIGH (ref 0.0–2.0)
Acid-base deficit: 2.8 mmol/L — ABNORMAL HIGH (ref 0.0–2.0)
Bicarbonate: 25.8 mmol/L (ref 20.0–28.0)
Bicarbonate: 25.7 mmol/L (ref 20.0–28.0)
O2 Saturation: 72.4 %
O2 Saturation: 59.1 %
Patient temperature: 37
Patient temperature: 37
pCO2, Ven: 69 mm[Hg] — ABNORMAL HIGH (ref 44–60)
pCO2, Ven: 60 mm[Hg] (ref 44–60)
pH, Ven: 7.18 — CL (ref 7.25–7.43)
pH, Ven: 7.24 — ABNORMAL LOW (ref 7.25–7.43)
pO2, Ven: 49 mm[Hg] — ABNORMAL HIGH (ref 32–45)
pO2, Ven: 39 mm[Hg] (ref 32–45)

## 2023-02-09 LAB — CBC
HCT: 34.6 % — ABNORMAL LOW (ref 39.0–52.0)
Hemoglobin: 10.7 g/dL — ABNORMAL LOW (ref 13.0–17.0)
MCH: 30.5 pg (ref 26.0–34.0)
MCHC: 30.9 g/dL (ref 30.0–36.0)
MCV: 98.6 fL (ref 80.0–100.0)
Platelets: 148 10*3/uL — ABNORMAL LOW (ref 150–400)
RBC: 3.51 MIL/uL — ABNORMAL LOW (ref 4.22–5.81)
RDW: 14.7 % (ref 11.5–15.5)
WBC: 8.4 10*3/uL (ref 4.0–10.5)
nRBC: 2.4 % — ABNORMAL HIGH (ref 0.0–0.2)

## 2023-02-09 LAB — CBC WITH DIFFERENTIAL/PLATELET
Abs Immature Granulocytes: 2.9 10*3/uL — ABNORMAL HIGH (ref 0.00–0.07)
Band Neutrophils: 4 %
Basophils Absolute: 0 10*3/uL (ref 0.0–0.1)
Basophils Relative: 0 %
Eosinophils Absolute: 0 10*3/uL (ref 0.0–0.5)
Eosinophils Relative: 0 %
HCT: 41.1 % (ref 39.0–52.0)
Hemoglobin: 12.8 g/dL — ABNORMAL LOW (ref 13.0–17.0)
Lymphocytes Relative: 17 %
Lymphs Abs: 2.6 10*3/uL (ref 0.7–4.0)
MCH: 30.5 pg (ref 26.0–34.0)
MCHC: 31.1 g/dL (ref 30.0–36.0)
MCV: 97.9 fL (ref 80.0–100.0)
Metamyelocytes Relative: 19 %
Monocytes Absolute: 1.5 10*3/uL — ABNORMAL HIGH (ref 0.1–1.0)
Monocytes Relative: 10 %
Neutro Abs: 8.2 10*3/uL — ABNORMAL HIGH (ref 1.7–7.7)
Neutrophils Relative %: 50 %
Platelets: 204 10*3/uL (ref 150–400)
RBC: 4.2 MIL/uL — ABNORMAL LOW (ref 4.22–5.81)
RDW: 14.7 % (ref 11.5–15.5)
Smear Review: NORMAL
WBC: 15.2 10*3/uL — ABNORMAL HIGH (ref 4.0–10.5)
nRBC: 1 % — ABNORMAL HIGH (ref 0.0–0.2)

## 2023-02-09 LAB — TROPONIN I (HIGH SENSITIVITY)
Troponin I (High Sensitivity): 182 ng/L (ref <18)
Troponin I (High Sensitivity): 167 ng/L (ref <18)

## 2023-02-09 LAB — APTT: aPTT: 26 s (ref 24–36)

## 2023-02-09 LAB — GLUCOSE, CAPILLARY: Glucose-Capillary: 95 mg/dL (ref 70–99)

## 2023-02-09 LAB — PHOSPHORUS: Phosphorus: 6 mg/dL — ABNORMAL HIGH (ref 2.5–4.6)

## 2023-02-09 LAB — PROTIME-INR
INR: 1.2 (ref 0.8–1.2)
Prothrombin Time: 15.8 s — ABNORMAL HIGH (ref 11.4–15.2)

## 2023-02-09 LAB — MAGNESIUM: Magnesium: 2.8 mg/dL — ABNORMAL HIGH (ref 1.7–2.4)

## 2023-02-09 LAB — LIPASE, BLOOD: Lipase: 31 U/L (ref 11–51)

## 2023-02-09 LAB — RESP PANEL BY RT-PCR (RSV, FLU A&B, COVID)  RVPGX2
Influenza A by PCR: NEGATIVE
Influenza B by PCR: NEGATIVE
Resp Syncytial Virus by PCR: NEGATIVE
SARS Coronavirus 2 by RT PCR: NEGATIVE

## 2023-02-09 LAB — LACTIC ACID, PLASMA
Lactic Acid, Venous: 3.6 mmol/L (ref 0.5–1.9)
Lactic Acid, Venous: 3.4 mmol/L (ref 0.5–1.9)
Lactic Acid, Venous: 3.2 mmol/L (ref 0.5–1.9)
Lactic Acid, Venous: 2.9 mmol/L (ref 0.5–1.9)

## 2023-02-09 LAB — BRAIN NATRIURETIC PEPTIDE: B Natriuretic Peptide: 53.1 pg/mL (ref 0.0–100.0)

## 2023-02-09 LAB — PREPARE RBC (CROSSMATCH)

## 2023-02-09 LAB — MRSA NEXT GEN BY PCR, NASAL: MRSA by PCR Next Gen: NOT DETECTED

## 2023-02-09 LAB — POTASSIUM: Potassium: 5.3 mmol/L — ABNORMAL HIGH (ref 3.5–5.1)

## 2023-02-09 LAB — CREATININE, SERUM
Creatinine, Ser: 6.93 mg/dL — ABNORMAL HIGH (ref 0.61–1.24)
GFR, Estimated: 7 mL/min — ABNORMAL LOW (ref >60)

## 2023-02-09 MED ORDER — CHLORHEXIDINE GLUCONATE CLOTH 2 % EX PADS
6.0000 | MEDICATED_PAD | Freq: Every day | CUTANEOUS | Status: DC
  Administered 2023-02-09 – 2023-02-12 (×4): 6 via TOPICAL

## 2023-02-09 MED ORDER — SODIUM ZIRCONIUM CYCLOSILICATE 5 G PO PACK
10.0000 g | PACK | Freq: Three times a day (TID) | ORAL | Status: DC
  Administered 2023-02-09 – 2023-02-10 (×4): 10 g
  Filled 2023-02-09 (×4): qty 2

## 2023-02-09 MED ORDER — ACETAMINOPHEN 325 MG PO TABS
650.0000 mg | ORAL_TABLET | ORAL | Status: DC | PRN
  Administered 2023-02-14: 650 mg via ORAL
  Filled 2023-02-09: qty 2

## 2023-02-09 MED ORDER — SODIUM CHLORIDE 0.9 % IV BOLUS (SEPSIS)
1000.0000 mL | Freq: Once | INTRAVENOUS | Status: AC
  Administered 2023-02-09: 1000 mL via INTRAVENOUS

## 2023-02-09 MED ORDER — SODIUM BICARBONATE 8.4 % IV SOLN
50.0000 meq | Freq: Once | INTRAVENOUS | Status: AC
  Administered 2023-02-09: 50 meq via INTRAVENOUS
  Filled 2023-02-09: qty 50

## 2023-02-09 MED ORDER — POLYETHYLENE GLYCOL 3350 17 G PO PACK: 17.0000 g | PACK | Freq: Every day | ORAL | Status: DC | PRN

## 2023-02-09 MED ORDER — SODIUM CHLORIDE 0.9% FLUSH: 3.0000 mL | INTRAVENOUS | Status: DC | PRN

## 2023-02-09 MED ORDER — INSULIN ASPART 100 UNIT/ML IJ SOLN
10.0000 [IU] | Freq: Once | INTRAMUSCULAR | Status: AC
  Administered 2023-02-09: 10 [IU] via INTRAVENOUS
  Filled 2023-02-09: qty 1

## 2023-02-09 MED ORDER — ONDANSETRON HCL 4 MG/2ML IJ SOLN: 4.0000 mg | Freq: Four times a day (QID) | INTRAMUSCULAR | Status: DC | PRN

## 2023-02-09 MED ORDER — DEXTROSE 50 % IV SOLN
1.0000 | Freq: Once | INTRAVENOUS | Status: AC
  Administered 2023-02-09: 50 mL via INTRAVENOUS
  Filled 2023-02-09: qty 50

## 2023-02-09 MED ORDER — POLYETHYLENE GLYCOL 3350 17 G PO PACK
17.0000 g | PACK | Freq: Every day | ORAL | Status: DC
  Administered 2023-02-11 – 2023-02-13 (×3): 17 g
  Filled 2023-02-09 (×4): qty 1

## 2023-02-09 MED ORDER — DOCUSATE SODIUM 100 MG PO CAPS: 100.0000 mg | ORAL_CAPSULE | Freq: Two times a day (BID) | ORAL | Status: DC | PRN

## 2023-02-09 MED ORDER — HYDROCORTISONE SOD SUC (PF) 100 MG IJ SOLR
100.0000 mg | Freq: Once | INTRAMUSCULAR | Status: AC
  Administered 2023-02-09: 100 mg via INTRAVENOUS
  Filled 2023-02-09: qty 2

## 2023-02-09 MED ORDER — PANTOPRAZOLE 80MG IVPB - SIMPLE MED
80.0000 mg | Freq: Once | INTRAVENOUS | Status: AC
  Administered 2023-02-09: 80 mg via INTRAVENOUS
  Filled 2023-02-09: qty 100

## 2023-02-09 MED ORDER — ROCURONIUM BROMIDE 50 MG/5ML IV SOLN
100.0000 mg | Freq: Once | INTRAVENOUS | Status: AC
  Administered 2023-02-09: 100 mg via INTRAVENOUS
  Filled 2023-02-09: qty 10

## 2023-02-09 MED ORDER — SODIUM CHLORIDE 0.9 % IV BOLUS
500.0000 mL | Freq: Once | INTRAVENOUS | Status: AC
  Administered 2023-02-09: 500 mL via INTRAVENOUS

## 2023-02-09 MED ORDER — CALCIUM GLUCONATE 10 % IV SOLN
1.0000 g | Freq: Once | INTRAVENOUS | Status: AC
  Administered 2023-02-09: 1 g via INTRAVENOUS
  Filled 2023-02-09: qty 10

## 2023-02-09 MED ORDER — NOREPINEPHRINE 16 MG/250ML-% IV SOLN
0.0000 ug/min | INTRAVENOUS | Status: DC
  Administered 2023-02-09: 22 ug/min via INTRAVENOUS
  Administered 2023-02-10: 15 ug/min via INTRAVENOUS
  Administered 2023-02-11: 13 ug/min via INTRAVENOUS
  Administered 2023-02-13: 2 ug/min via INTRAVENOUS
  Filled 2023-02-09 (×4): qty 250

## 2023-02-09 MED ORDER — DOCUSATE SODIUM 50 MG/5ML PO LIQD
100.0000 mg | Freq: Two times a day (BID) | ORAL | Status: DC
  Administered 2023-02-09 – 2023-02-14 (×8): 100 mg
  Filled 2023-02-09 (×8): qty 10

## 2023-02-09 MED ORDER — SODIUM CHLORIDE 0.9% FLUSH
3.0000 mL | Freq: Two times a day (BID) | INTRAVENOUS | Status: DC
  Administered 2023-02-09 – 2023-02-14 (×8): 3 mL via INTRAVENOUS

## 2023-02-09 MED ORDER — DEXTROSE 50 % IV SOLN
2.0000 | Freq: Once | INTRAVENOUS | Status: AC
  Administered 2023-02-09: 100 mL via INTRAVENOUS
  Filled 2023-02-09: qty 100

## 2023-02-09 MED ORDER — ETOMIDATE 2 MG/ML IV SOLN
INTRAVENOUS | Status: AC
  Administered 2023-02-09: 20 mg
  Filled 2023-02-09: qty 10

## 2023-02-09 MED ORDER — MIDAZOLAM HCL 2 MG/2ML IJ SOLN
1.0000 mg | INTRAMUSCULAR | Status: DC | PRN
  Administered 2023-02-09 – 2023-02-13 (×17): 2 mg via INTRAVENOUS
  Filled 2023-02-09 (×17): qty 2

## 2023-02-09 MED ORDER — NOREPINEPHRINE 4 MG/250ML-% IV SOLN
0.0000 ug/min | INTRAVENOUS | Status: DC
  Administered 2023-02-09: 10 ug/min via INTRAVENOUS
  Administered 2023-02-09: 15 ug/min via INTRAVENOUS
  Filled 2023-02-09 (×2): qty 250

## 2023-02-09 MED ORDER — FENTANYL 2500MCG IN NS 250ML (10MCG/ML) PREMIX INFUSION
0.0000 ug/h | INTRAVENOUS | Status: DC
  Administered 2023-02-09: 25 ug/h via INTRAVENOUS
  Administered 2023-02-10 – 2023-02-11 (×2): 100 ug/h via INTRAVENOUS
  Administered 2023-02-12: 225 ug/h via INTRAVENOUS
  Administered 2023-02-13: 200 ug/h via INTRAVENOUS
  Filled 2023-02-09 (×5): qty 250

## 2023-02-09 MED ORDER — STERILE WATER FOR INJECTION IV SOLN
Freq: Once | INTRAVENOUS | Status: AC
  Filled 2023-02-09: qty 150

## 2023-02-09 MED ORDER — VASOPRESSIN 20 UNITS/100 ML INFUSION FOR SHOCK
0.0000 [IU]/min | INTRAVENOUS | Status: DC
  Administered 2023-02-09: 0.03 [IU]/min via INTRAVENOUS
  Administered 2023-02-10 – 2023-02-11 (×6): 0.04 [IU]/min via INTRAVENOUS
  Administered 2023-02-12 – 2023-02-13 (×3): 0.03 [IU]/min via INTRAVENOUS
  Administered 2023-02-14: 0.04 [IU]/min via INTRAVENOUS
  Filled 2023-02-09 (×13): qty 100

## 2023-02-09 MED ORDER — SODIUM CHLORIDE 0.9 % IV SOLN
2.0000 g | Freq: Once | INTRAVENOUS | Status: AC
  Administered 2023-02-09: 2 g via INTRAVENOUS
  Filled 2023-02-09: qty 12.5

## 2023-02-09 MED ORDER — SODIUM CHLORIDE 0.9 % IV SOLN
250.0000 mL | INTRAVENOUS | Status: AC
  Administered 2023-02-09: 250 mL via INTRAVENOUS

## 2023-02-09 MED ORDER — INSULIN ASPART 100 UNIT/ML IV SOLN
10.0000 [IU] | Freq: Once | INTRAVENOUS | Status: AC
  Administered 2023-02-09: 10 [IU] via INTRAVENOUS
  Filled 2023-02-09: qty 0.1

## 2023-02-09 MED ORDER — VANCOMYCIN HCL IN DEXTROSE 1-5 GM/200ML-% IV SOLN
1000.0000 mg | Freq: Once | INTRAVENOUS | Status: AC
  Administered 2023-02-09: 1000 mg via INTRAVENOUS
  Filled 2023-02-09: qty 200

## 2023-02-09 MED ORDER — HEPARIN SODIUM (PORCINE) 5000 UNIT/ML IJ SOLN
5000.0000 [IU] | Freq: Three times a day (TID) | INTRAMUSCULAR | Status: DC
  Administered 2023-02-09: 5000 [IU] via SUBCUTANEOUS
  Filled 2023-02-09: qty 1

## 2023-02-09 MED ORDER — SODIUM CHLORIDE 0.9 % IV BOLUS (SEPSIS): 250.0000 mL | Freq: Once | INTRAVENOUS | Status: DC

## 2023-02-09 MED ORDER — PIPERACILLIN-TAZOBACTAM IN DEX 2-0.25 GM/50ML IV SOLN
2.2500 g | Freq: Three times a day (TID) | INTRAVENOUS | Status: DC
  Administered 2023-02-09 – 2023-02-13 (×11): 2.25 g via INTRAVENOUS
  Filled 2023-02-09 (×13): qty 50

## 2023-02-09 MED ORDER — CALCIUM GLUCONATE 10 % IV SOLN
1.0000 g | Freq: Once | INTRAVENOUS | Status: AC
  Filled 2023-02-09: qty 10

## 2023-02-09 MED ORDER — SODIUM CHLORIDE 0.9% FLUSH
10.0000 mL | Freq: Two times a day (BID) | INTRAVENOUS | Status: DC
  Administered 2023-02-09 – 2023-02-14 (×10): 10 mL via INTRAVENOUS

## 2023-02-09 NOTE — ED Provider Notes (Signed)
[facility] Provider Note    Event Date/Time   First MD Initiated Contact with Patient 02/09/23 1102     (approximate)   History   Chief Complaint: Diarrhea, Altered Mental Status, and Shortness of Breath   HPI  Jay Sandoval is a 82 y.o. 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  First went to an acute rehab and then home, today EMS was called due to shortness of breath and confusion.  No fall or fever or known trauma.  Patient has been having diarrhea for the last few days.     Physical Exam   Triage Vital Signs: ED Triage Vitals  Encounter Vitals Group     BP      Systolic BP Percentile      Diastolic BP Percentile      Pulse      Resp      Temp      Temp src      SpO2      Weight      Height      Head Circumference      Peak Flow      Pain Score      Pain Loc      Pain Education      Exclude from Growth Chart     Most recent vital signs: Vitals:   02/09/23 1430 02/09/23 1445  BP: (!) 77/44 93/69  Pulse: 63 61  Resp: 20 20  Temp:    SpO2: 100% 100%    General: Awake, moderate distress. CV:  Good peripheral perfusion.  Regular rate and rhythm, heart rate 60 Resp:  Tachypnea, respiratory rate 24, may be related to morbid obesity.  Clear to auscultation bilaterally Abd:  No distention.  Soft nontender Other:  Copious melanotic black diarrhea, strongly Hemoccult positive.  Patient has stage I pressure injury of the skin on the left gluteus without any open wounds or visible necrosis.  No perineal induration or crepitus.  There is a small area of left inguinal anterior intertrigo but no significant soft tissue infection   ED Results / Procedures / Treatments   Labs (all labs ordered are listed, but only abnormal results are displayed) Labs Reviewed  LACTIC ACID, PLASMA - Abnormal; Notable for the following  components:      Result Value   Lactic Acid, Venous 3.6 (*)    All other components within normal limits  LACTIC ACID, PLASMA - Abnormal; Notable for the following components:   Lactic Acid, Venous 3.4 (*)    All other components within normal limits  CBC WITH DIFFERENTIAL/PLATELET - Abnormal; Notable for the following components:   WBC 15.2 (*)    RBC 4.20 (*)    Hemoglobin 12.8 (*)    nRBC 1.0 (*)    Neutro Abs 8.2 (*)    Monocytes Absolute 1.5 (*)    Abs Immature Granulocytes 2.90 (*)    All other components within normal limits  PROTIME-INR - Abnormal; Notable for the following components:   Prothrombin Time 15.8 (*)    All other components within normal limits  BLOOD GAS, VENOUS - Abnormal; Notable for the following components:   pH, Ven 7.18 (*)    pCO2, Ven 69 (*)    pO2, Ven 49 (*)    Acid-base deficit 4.0 (*)    All other components within normal limits  URINALYSIS, W/ REFLEX TO CULTURE (INFECTION SUSPECTED) - Abnormal; Notable for the following components:   Color, Urine YELLOW (*)    APPearance CLOUDY (*)    Hgb urine dipstick SMALL (*)    Bacteria, UA RARE (*)    All other components within normal limits  COMPREHENSIVE METABOLIC PANEL - Abnormal; Notable for the following components:   Potassium 7.4 (*)    Glucose, Bld 125 (*)    BUN 151 (*)    Creatinine, Ser 6.97 (*)    Albumin 2.8 (*)    AST 148 (*)    ALT 67 (*)    GFR, Estimated 7 (*)    Anion gap 16 (*)    All other components within normal limits  CBC - Abnormal; Notable for the following components:   RBC 3.51 (*)    Hemoglobin 10.7 (*)    HCT 34.6 (*)    Platelets 148 (*)    nRBC 2.4 (*)    All other components within normal limits  CREATININE, SERUM - Abnormal; Notable for the following components:   Creatinine, Ser 6.93 (*)    GFR, Estimated 7 (*)    All other components within normal limits  POTASSIUM - Abnormal; Notable for the following components:   Potassium 5.3 (*)    All other  components within normal limits  BLOOD GAS, VENOUS - Abnormal; Notable for the following components:   pH, Ven 7.24 (*)    Acid-base deficit 2.8 (*)    All other components within normal limits  TROPONIN I (HIGH SENSITIVITY) - Abnormal; Notable for the following components:   Troponin I (High Sensitivity) 182 (*)    All other components within normal limits  TROPONIN I (HIGH SENSITIVITY) - Abnormal; Notable for the following components:   Troponin I (High Sensitivity) 167 (*)    All other components within normal limits  RESP PANEL BY RT-PCR (RSV, FLU A&B, COVID)  RVPGX2  CULTURE, BLOOD (ROUTINE X 2)  CULTURE, BLOOD (ROUTINE X 2)  GASTROINTESTINAL PANEL BY PCR, STOOL (REPLACES STOOL CULTURE)  C DIFFICILE QUICK SCREEN W PCR REFLEX    APTT  BRAIN NATRIURETIC PEPTIDE  LIPASE, BLOOD  BASIC METABOLIC PANEL  TYPE AND SCREEN  PREPARE RBC (CROSSMATCH)     EKG Interpreted by me Sinus rhythm rate of 63.  Left axis, normal intervals, right bundle branch block.  No acute ischemic changes.   RADIOLOGY Chest x-ray interpreted by me, negative for pneumonia edema or pleural effusion.  Radiology report reviewed.   PROCEDURES:  .Critical Care  Performed by: Sharman Cheek, MD Authorized by: Sharman Cheek, MD   Critical care provider statement:    Critical care time (minutes):  35   Critical care time was exclusive of:  Separately billable procedures and treating other patients   Critical care was necessary to treat or prevent imminent or life-threatening deterioration of the following conditions:  Respiratory failure and circulatory failure   Critical care was time spent personally by me on the following activities:  Development of treatment plan with patient or surrogate, discussions with consultants, evaluation of patient's response to treatment, examination of patient, obtaining history from patient or surrogate, ordering and performing treatments and interventions, ordering and  review of laboratory studies, ordering and review of radiographic studies, pulse oximetry, re-evaluation of patient's condition and review of old charts   Care discussed with: admitting provider      MEDICATIONS ORDERED IN ED: Medications  sodium bicarbonate 150 mEq in sterile water 1,150 mL  infusion ( Intravenous New Bag/Given 02/09/23 1404)  sodium chloride flush (NS) 0.9 % injection 3 mL (3 mLs Intravenous Not Given 02/09/23 1407)  sodium chloride flush (NS) 0.9 % injection 3 mL (has no administration in time range)  acetaminophen (TYLENOL) tablet 650 mg (has no administration in time range)  docusate sodium (COLACE) capsule 100 mg (has no administration in time range)  polyethylene glycol (MIRALAX / GLYCOLAX) packet 17 g (has no administration in time range)  ondansetron (ZOFRAN) injection 4 mg (has no administration in time range)  heparin injection 5,000 Units (5,000 Units Subcutaneous Given 02/09/23 1415)  sodium chloride flush (NS) 0.9 % injection 10 mL (has no administration in time range)  0.9 %  sodium chloride infusion (has no administration in time range)  norepinephrine (LEVOPHED) 4mg  in (0.016 mg/mL) premix infusion (has no administration in time range)  vancomycin (VANCOCIN) IVPB 1000 mg/200 mL premix (0 mg Intravenous Stopped 02/09/23 1321)  ceFEPIme (MAXIPIME) 2 g in sodium chloride 0.9 % 100 mL IVPB (0 g Intravenous Stopped 02/09/23 1213)  pantoprazole (PROTONIX) 80 mg /NS 100 mL IVPB (0 mg Intravenous Stopped 02/09/23 1218)  sodium chloride 0.9 % bolus 1,000 mL (0 mLs Intravenous Stopped 02/09/23 1343)    And  sodium chloride 0.9 % bolus 1,000 mL (0 mLs Intravenous Stopped 02/09/23 1413)  calcium gluconate inj 10% (1 g) URGENT USE ONLY! (1 g Intravenous Given 02/09/23 1323)  sodium bicarbonate injection 50 mEq (50 mEq Intravenous Given 02/09/23 1324)  insulin aspart (novoLOG) injection 10 Units (10 Units Intravenous Given 02/09/23 1322)  dextrose 50 % solution 100  mL (100 mLs Intravenous Given 02/09/23 1327)  sodium chloride 0.9 % bolus 500 mL (500 mLs Intravenous New Bag/Given 02/09/23 1414)     IMPRESSION / MDM / ASSESSMENT AND PLAN / ED COURSE  I reviewed the triage vital signs and the nursing notes.  DDx: Acute blood loss anemia, upper GI bleed, sepsis, HCAP, dehydration, AKI, electrolyte abnormality, intracranial hemorrhage, pulmonary embolism  Patient's presentation is most consistent with acute presentation with potential threat to life or bodily function.  Patient presents with altered mental status, vital signs are okay, but EMS does report oxygen was saturation in the 80s.  He is post to be on 3 L nasal cannula.  Has clinically apparent upper GI bleed.  Will give Protonix in addition to empiric antibiotics while checking labs.  Will obtain CT head   Clinical Course as of 02/09/23 1529  Thu Feb 09, 2023  1128 Patient is currently breathing adequately and protecting his airway.  With his oxygen dependent COPD, heart failure, and morbid obesity, will need to avoid intubation if possible [PS]  1246 Patient is developed hypotension, concerning for septic shock or hypovolemia from acute blood loss.  BMI is 58.  Ideal body weight based on his height of 5 foot 8 is 68 kg.  Will give 30 mL a liter per kilogram bolus based on ideal body weight, total 2250 mL. [PS]  1247 Patient is on BiPAP due to respiratory acidosis on the VBG, tolerating well. [PS]    Clinical Course User Index [PS] Sharman Cheek, MD    ----------------------------------------- 1:14 PM on 02/09/2023 ----------------------------------------- Chemistry results shows acute renal failure with a creatinine of 7, potassium of 7.4, BUN 150.  Will give calcium bicarb insulin glucose.  ICU team contacted.  ----------------------------------------- 3:29 PM on 02/09/2023 ----------------------------------------- Discussed with nephrology at bedside.  Recommends bicarb infusion x  1000 mL.  Further care by ICU  team.   FINAL CLINICAL IMPRESSION(S) / ED DIAGNOSES   Final diagnoses:  Acute renal failure, unspecified acute renal failure type (HCC)  Acute respiratory failure with hypoxia (HCC)  Upper GI bleeding     Rx / DC Orders   ED Discharge Orders     None        Note:  This document was prepared using Dragon voice recognition software and may include unintentional dictation errors.   Sharman Cheek, MD 02/09/23 1530

## 2023-02-09 NOTE — ED Notes (Signed)
Jay Court, MD, made aware of troponin 182

## 2023-02-09 NOTE — Progress Notes (Signed)
Per Dr.Kasa okay to use central line. X-ray ordered, reviewed by Dr.Kasa.

## 2023-02-09 NOTE — Progress Notes (Signed)
PHARMACY -  BRIEF ANTIBIOTIC NOTE   Pharmacy has received consult(s) for Cefepime and Vancomycin from an ED provider.  The patient's profile has been reviewed for ht/wt/allergies/indication/available labs.    One time order(s) placed for Cefepime 2g and Vancomycin 1g by ED provider  Further antibiotics/pharmacy consults should be ordered by admitting physician if indicated.                       Thank you, Foye Deer 02/09/2023  11:15 AM

## 2023-02-09 NOTE — Consult Note (Signed)
PHARMACY CONSULT NOTE - ELECTROLYTES  Pharmacy Consult for Electrolyte Monitoring and Replacement   Recent Labs: Potassium (mmol/L)  Date Value  02/09/2023 7.4 (HH)   Magnesium (mg/dL)  Date Value  16/01/9603 2.0   Calcium (mg/dL)  Date Value  54/12/8117 9.1   Albumin (g/dL)  Date Value  14/78/2956 2.8 (L)  09/07/2017 4.5   Sodium (mmol/L)  Date Value  02/09/2023 139  09/07/2017 143     CrCl cannot be calculated (Unknown ideal weight.).  Assessment  Jay Sandoval is a 82 y.o. male presenting with altered mental status and diarrhea x several days. PMH significant for COPD / asthma, CHF, HTN, GERD, OSA. Pharmacy has been consulted to monitor and replace electrolytes.  Diet: NPO MIVF: Bicarb gtt at 125 cc/hr Pertinent medications: Status multiple fluids boluses for sepsis, insulin & dextrose, calcium for hyperkalemia  Goal of Therapy: Electrolytes within normal limits  Plan:  K 7.4 >> 5.3 with implemented therapies Re-check BMP at 2000 today. All electrolytes with AM labs tomorrow  Thank you for allowing pharmacy to be a part of this patient's care.  Tressie Ellis 02/09/2023 2:23 PM

## 2023-02-09 NOTE — ED Notes (Signed)
Scotty Court, MD, made aware of lactic 3.6

## 2023-02-09 NOTE — Procedures (Signed)
Central Venous Catheter Insertion Procedure Note  COLTYN HANNING  [id number]  1940/05/22  Date:02/09/23  Time:5:03 PM   Provider Performing:Sidrah Harden Earnest Conroy   Procedure: Insertion of Non-tunneled Central Venous Catheter(36556) with US guidance (40981)   Indication(s) Medication administration and Difficult access  Consent Unable to obtain consent due to emergent nature of procedure.  Anesthesia Topical only with 1% lidocaine   Timeout Verified patient identification, verified procedure, site/side was marked, verified correct patient position, special equipment/implants available, medications/allergies/relevant history reviewed, required imaging and test results available.  Sterile Technique Maximal sterile technique including full sterile barrier drape, hand hygiene, sterile gown, sterile gloves, mask, hair covering, sterile ultrasound probe cover (if used).  Procedure Description Area of catheter insertion was cleaned with chlorhexidine and draped in sterile fashion.  With real-time ultrasound guidance a central venous catheter was placed into the left internal jugular vein. Nonpulsatile blood flow and easy flushing noted in all ports.  The catheter was sutured in place and sterile dressing applied.  Complications/Tolerance None; patient tolerated the procedure well. Chest X-ray is ordered to verify placement for internal jugular or subclavian cannulation.   Chest x-ray is not ordered for femoral cannulation.  EBL Minimal  Specimen(s) None  Zada Girt, AGNP  Pulmonary/Critical Care Pager [phone] (please enter 7 digits) PCCM Consult Pager [phone] (please enter 7 digits)

## 2023-02-09 NOTE — H&P (Signed)
NAME:  Jay Sandoval, MRN:  [id number], DOB:  Jan 18, 1941, LOS: 0 ADMISSION DATE:  01/30/2023 CONSULTATION DATE:  01/30/23  CHIEF COMPLAINT:  AMS and 2 days of diarrhea  BRIEF SYNOPSIS Patient presents in acute respiratory distress   History of Present Illness:  82 year old male brought to the ED for altered mental status with complaint of diarrhea x 2 days. The patient was discharged 2 weeks ago for pneumonia and went home on PO medication. The patient is currently bed bound and family member reports that the patient is minimally responsive. The patient was brought in my EMS who report that O2 sat was 80% on room air.  The patient was put on BiPAP in the ED and consulted ICU team Patient seen on biPAP, severe resp distress, severe encephalopathy  ER Course CODE SEPSIS CALLED Give IVF's Give ABX Placed on biPAP Placed a foley Obtained Cx's ARF-Bun 150 K=7.4 WBC 15 LA 3.4 VBG 7.18/69    Pertinent  Medical History   Past Medical History:  Diagnosis Date   Arthritis    Asthma    Hypertension    Osteoporosis    Sleep apnea      Significant Hospital Events: Including procedures, antibiotic start and stop dates in addition to other pertinent events   10/17 Patient presents to the ED with acute respiratory distress      Micro Data:    Antimicrobials:  Patient indicated for empiric abx as per sepsis protocol  Antibiotics Given (last 72 hours)     Date/Time Action Medication Dose Rate   01/27/2023 1134 New Bag/Given   ceFEPIme (MAXIPIME) 2 g in sodium chloride 0.9 % 100 mL IVPB 2 g 200 mL/hr   02/20/2023 1214 New Bag/Given   vancomycin (VANCOCIN) IVPB 1000 mg/200 mL premix 1,000 mg 200 mL/hr        Interim History / Subjective:  +somnolence +BiPAP +increased work of breathing Patient presents as critically ill     Objective   Blood pressure (!) 77/44, pulse 63, temperature 97.6 F (36.4 C), temperature source Axillary, resp. rate 20, SpO2 100%.     FiO2 (%):  [60 %] 60 %  No intake or output data in the 24 hours ending 02/17/2023 1451 There were no vitals filed for this visit.   REVIEW OF SYSTEMS  PATIENT IS UNABLE TO PROVIDE COMPLETE REVIEW OF SYSTEMS DUE TO SEVERE CRITICAL ILLNESS   PHYSICAL EXAMINATION:  GENERAL:critically ill appearing, +resp distress EYES: Pupils equal, round, reactive to light.  No scleral icterus.  MOUTH: Moist mucosal membrane. On BiPAP NECK: Supple.  PULMONARY: Lungs clear to auscultation, +rhonchi, +wheezing CARDIOVASCULAR: S1 and S2.  Regular rate and rhythm GASTROINTESTINAL: Soft, nontender, -distended. Positive bowel sounds.  MUSCULOSKELETAL: minimal edema.  NEUROLOGIC: Follows commands, somnolent  SKIN:normal, warm to touch, Capillary refill delayed  Pulses present bilaterally     ASSESSMENT AND PLAN SYNOPSIS  82 yo morbidly obese AAM admitted for acute hypoxic resp failure due to probable underlying pneumonia with probable acute cardiac failure with shock likely due to combination of sepsis/cardiogenic/Hypovolumic  complicated by severe metabolic acidosis and acute metabolic encephalopathy with  acute renal failure   Severe ACUTE Hypoxic and Hypercapnic Respiratory Failure -On BiPAP High risk for intubation and cardiac arrest  FiO2 (%):  [60 %] 60 %   CARDIAC FAILURE-check ECHO -oxygen as needed -Lasix as tolerated -follow up cardiac enzymes as indicated   CARDIAC ICU monitoring   ACUTE KIDNEY INJURY/Renal Failure -continue Foley Catheter-assess need -Avoid  nephrotoxic agents -Follow urine output, BMP -Ensure adequate renal perfusion, optimize oxygenation -Renal dose medications   No intake or output data in the 24 hours ending 01/25/2023 1507    NEUROLOGY ACUTE METABOLIC ENCEPHALOPATHY High risk for aspiration  SHOCK  -use vasopressors to keep MAP>65 as needed -follow ABG and LA -follow up cultures -emperic ABX   ENDO - ICU hypoglycemic\Hyperglycemia  protocol -check FSBS per protocol   GI GI PROPHYLAXIS as indicated Diarrhea with previous ABX Check C diff  NUTRITIONAL STATUS DIET- NPO Constipation protocol as indicated   ELECTROLYTES -follow labs as needed -replace as needed -pharmacy consultation and following   ACUTE ANEMIA- TRANSFUSE AS NEEDED CONSIDER TRANSFUSION  IF HGB<7    Best practice (right click and "Reselect all SmartList Selections" daily)  Diet: NPO  DVT prophylaxis: Subcutaneous Heparin GI prophylaxis: H2B Glucose control:  SSI Yes Foley:  Yes, and it is still needed Mobility:  bed rest  Code Status:  FULL Disposition:ICU  Labs   CBC: Recent Labs  Lab 02/11/2023 1116  WBC 15.2*  NEUTROABS 8.2*  HGB 12.8*  HCT 41.1  MCV 97.9  PLT 204    Basic Metabolic Panel: Recent Labs  Lab 02/16/2023 1215  NA 139  K 7.4*  CL 99  CO2 24  GLUCOSE 125*  BUN 151*  CREATININE 6.97*  CALCIUM 9.1   GFR: CrCl cannot be calculated (Unknown ideal weight.). Recent Labs  Lab 02/01/2023 1116 02/07/2023 1332  WBC 15.2*  --   LATICACIDVEN 3.6* 3.4*    Liver Function Tests: Recent Labs  Lab 02/13/2023 1215  AST 148*  ALT 67*  ALKPHOS 46  BILITOT 0.5  PROT 6.7  ALBUMIN 2.8*   Recent Labs  Lab 02/05/2023 1215  LIPASE 31   No results for input(s): "AMMONIA" in the last 168 hours.  ABG    Component Value Date/Time   HCO3 25.8 02/21/2023 1116   ACIDBASEDEF 4.0 (H) 02/01/2023 1116   O2SAT 72.4 02/13/2023 1116     Coagulation Profile: Recent Labs  Lab 02/17/2023 1116  INR 1.2    Surgical History:   Past Surgical History:  Procedure Laterality Date   COLONOSCOPY N/A 07/10/2020   Procedure: COLONOSCOPY;  Surgeon: Pasty Spillers, MD;  Location: [facility] ENDOSCOPY;  Service: Endoscopy;  Laterality: N/A;   KNEE ARTHROPLASTY     SHOULDER ARTHROSCOPY DISTAL CLAVICLE EXCISION AND OPEN ROTATOR CUFF REPAIR       Social History:   reports that he quit smoking about 7 years ago. His smoking use  included cigarettes. He has never used smokeless tobacco. He reports that he does not drink alcohol and does not use drugs.   Family History:  His family history is not on file.   Allergies No Known Allergies   Home Medications  Prior to Admission medications   Medication Sig Start Date End Date Taking? Authorizing Provider  albuterol (PROVENTIL) (2.5 MG/3ML) 0.083% nebulizer solution Take 2.5 mg by nebulization every 6 (six) hours as needed. 03/04/16   [provider]  albuterol (VENTOLIN HFA) 108 (90 Base) MCG/ACT inhaler Inhale 2 puffs into the lungs every 6 (six) hours as needed for wheezing or shortness of breath.    [provider]  allopurinol (ZYLOPRIM) 100 MG tablet Take 200 mg by mouth daily. 05/27/14   [provider]  aspirin EC 81 MG tablet Take 81 mg by mouth daily. Swallow whole.    [provider]  atenolol (TENORMIN) 50 MG tablet Take 50 mg by  mouth daily. 02/15/16   [provider]  atorvastatin (LIPITOR) 10 MG tablet Take 1 tablet (10 mg total) by mouth daily. 01/11/23   Leeroy Bock, MD  benazepril (LOTENSIN) 40 MG tablet Take 40 mg by mouth daily.    [provider]  Coenzyme Q10 100 MG capsule Take 100 mg by mouth 2 (two) times daily.    [provider]  DULoxetine (CYMBALTA) 60 MG capsule Take 60 mg by mouth daily.    [provider]  fluticasone (FLONASE) 50 MCG/ACT nasal spray Place 2 sprays into both nostrils daily as needed.    [provider]  levothyroxine (SYNTHROID) 25 MCG tablet Take 25 mcg by mouth daily before breakfast. 10/13/18   [provider]  meloxicam (MOBIC) 7.5 MG tablet Take 1 tablet (7.5 mg total) by mouth daily as needed for pain. 01/10/23   Leeroy Bock, MD  metolazone (ZAROXOLYN) 2.5 MG tablet Take 2.5 mg by mouth every 7 (seven) days.    [provider]  Multiple Vitamins-Minerals (MULTIVITAL PO) Take 1 tablet by mouth daily.    [provider]  omega-3 acid ethyl esters (LOVAZA) 1 g capsule Take 2 capsules by mouth 2 (two) times daily.    [provider]  potassium chloride SA (K-DUR) 20 MEQ tablet Take 20 mEq by mouth daily. 09/06/18   [provider]  pregabalin (LYRICA) 100 MG capsule Take 1 capsule (100 mg total) by mouth 2 (two) times daily. 01/10/23 02/20/2023  Leeroy Bock, MD  RESTASIS 0.05 % ophthalmic emulsion Place 1 drop into both eyes 2 (two) times daily. 02/25/16   [provider]  SYMBICORT 160-4.5 MCG/ACT inhaler Inhale 2 puffs into the lungs 2 (two) times daily.    [provider]  torsemide 40 MG TABS Take 40 mg by mouth daily. 01/10/23   Leeroy Bock, MD  WEGOVY 0.5 MG/0.5ML SOAJ Inject 0.5 mg into the skin once a week. Saturday    [provider]       DVT/GI PRX  assessed I Assessed the need for Labs I Assessed the need for Foley I Assessed the need for Central Venous Line Family Discussion when available I Assessed the need for Mobilization I made an Assessment of medications to be adjusted accordingly Safety Risk assessment completed  CASE DISCUSSED IN MULTIDISCIPLINARY ROUNDS WITH ICU TEAM     Critical Care Time devoted to patient care services described in this note is 75 minutes.   Critical care was necessary to treat /prevent imminent and life-threatening deterioration.   PATIENT WITH VERY POOR PROGNOSIS I ANTICIPATE PROLONGED ICU LOS  Patient is critically ill. Patient with Multiorgan failure and at high risk for cardiac arrest and death.    Lucie Leather, M.D.  [facility]  Medical Director [facility] Medical Director [facility] Cardio-Pulmonary Department

## 2023-02-09 NOTE — Progress Notes (Signed)
CODE SEPSIS - PHARMACY COMMUNICATION  **Broad Spectrum Antibiotics should be administered within 1 hour of Sepsis diagnosis**  Time Code Sepsis Called/Page Received: 11:09  Antibiotics Ordered: Cefepime and Vancomycin  Time of 1st antibiotic administration: Cefepime given at 11:31  Additional action taken by pharmacy: n/a  If necessary, Name of Provider/Nurse Contacted: n/a    Foye Deer ,PharmD Clinical Pharmacist  02/09/2023  11:16 AM

## 2023-02-09 NOTE — Sepsis Progress Note (Signed)
Elink will follow per sepsis protocol

## 2023-02-09 NOTE — Procedures (Signed)
Endotracheal Intubation: Patient required placement of an artificial airway secondary to unresponsive state   Consent: Emergent.    Hand washing performed prior to starting the procedure.    Medications administered for sedation prior to procedure:  20 mg Etomidate IV, Rocuronium 100 mg IV     A time out procedure was called and correct patient, name, & ID confirmed. Needed supplies and equipment were assembled and checked to include ETT, 10 ml syringe, Glidescope, Mac and Miller blades, suction, oxygen and bag mask valve, end tidal CO2 monitor.    Patient was positioned to align the mouth and pharynx to facilitate visualization of the glottis.    Heart rate, SpO2 and blood pressure was continuously monitored during the procedure. Pre-oxygenation was conducted prior to intubation and endotracheal tube was placed through the vocal cords into the trachea.       The artificial airway was placed under direct visualization via glidescope route using a 8.0 cm ETT on the first attempt.   ETT was secured at 24 cm .   Placement was confirmed by auscuitation of lungs with good breath sounds bilaterally and no stomach sounds.  Condensation was noted on endotracheal tube.   Pulse ox ____  CO2 detector in place with appropriate color change.    Complications: None .        Chest radiograph ordered and pending.   Zada Girt, AGNP  Pulmonary/Critical Care Pager [phone] (please enter 7 digits) PCCM Consult Pager [phone] (please enter 7 digits)

## 2023-02-09 NOTE — Consult Note (Signed)
Consultation Note Date: 02/12/2023   Patient Name: Jay Sandoval  DOB: 08-Sep-1940  MRN: [id number]  Age / Sex: 82 y.o., male  PCP: Barbette Reichmann, MD Referring Physician: Erin Fulling, MD  Reason for Consultation: Establishing goals of care  HPI/Patient Profile: Per H&P:  82 year old male brought to the ED for altered mental status with complaint of diarrhea x 2 days. The patient was discharged 2 weeks ago for pneumonia and went home on PO medication. The patient is currently bed bound and family member reports that the patient is minimally responsive. The patient was brought in my EMS who report that O2 sat was 80% on room air.    Clinical Assessment and Goals of Care:   Notes and labs reviewed. In to see patient, he is currently laying in bed on BIPAP, hypotensive. He does not respond to me.  Nurses working with patient.  No family at bedside.  Per staff and CCM they have been unable to reach patient's niece.  Stepped out and called niece. She discussed that patient lives alone.  She tells me of his recent admission and that he had been on antibiotics following that admission.  She discusses that home health physical therapy has been coming into the home to work with him.  She states since discharge she has been mostly bedbound.  Niece advises that he has been having frequent bowel movements and had minimal to no urination yesterday, and was not eating well.  In regard to care, her niece advises that patient would want any and all care possible to keep him alive.  CCM present at my location and phone handed to CCM to speak with niece.  SUMMARY OF RECOMMENDATIONS   Full code and full scope at this time. PMT will follow       Primary Diagnoses: Present on Admission:  Acute respiratory failure with hypoxia (HCC)   I have reviewed the medical record, interviewed the patient and family, and examined  the patient. The following aspects are pertinent.  Past Medical History:  Diagnosis Date   Arthritis    Asthma    Hypertension    Osteoporosis    Sleep apnea    Social History   Socioeconomic History   Marital status: Single    Spouse name: Not on file   Number of children: Not on file   Years of education: Not on file   Highest education level: Not on file  Occupational History   Not on file  Tobacco Use   Smoking status: Former    Current packs/day: 0.00    Types: Cigarettes    Quit date: 05/15/2015    Years since quitting: 7.7   Smokeless tobacco: Never  Substance and Sexual Activity   Alcohol use: No   Drug use: Never   Sexual activity: Not on file  Other Topics Concern   Not on file  Social History Narrative   Not on file   Social Determinants of Health   Financial Resource Strain: Low Risk  (  12/20/2022)   Received from [facility]   Overall Financial Resource Strain (CARDIA)    Difficulty of Paying Living Expenses: Not hard at all  Food Insecurity: Unknown (01/04/2023)   Hunger Vital Sign    Worried About Running Out of Food in the Last Year: Never true    Ran Out of Food in the Last Year: Patient declined  Transportation Needs: No Transportation Needs (01/04/2023)   PRAPARE - Administrator, Civil Service (Medical): No    Lack of Transportation (Non-Medical): No  Physical Activity: Not on file  Stress: Not on file  Social Connections: Unknown (09/07/2021)   [facility]   Social Network    Social Network: Not on file   History reviewed. No pertinent family history. Scheduled Meds:  heparin  5,000 Units Subcutaneous Q8H   sodium chloride flush  10 mL Intravenous Q12H   sodium chloride flush  3 mL Intravenous Q12H   Continuous Infusions:  sodium chloride     norepinephrine (LEVOPHED) Adult infusion     sodium bicarbonate 150 mEq in sterile water 1,150 mL infusion 125 mL/hr at 02/18/2023 1404    PRN Meds:.acetaminophen, docusate sodium, ondansetron (ZOFRAN) IV, polyethylene glycol, sodium chloride flush Medications Prior to Admission:  Prior to Admission medications   Medication Sig Start Date End Date Taking? Authorizing Provider  albuterol (PROVENTIL) (2.5 MG/3ML) 0.083% nebulizer solution Take 2.5 mg by nebulization every 6 (six) hours as needed. 03/04/16   [provider]  albuterol (VENTOLIN HFA) 108 (90 Base) MCG/ACT inhaler Inhale 2 puffs into the lungs every 6 (six) hours as needed for wheezing or shortness of breath.    [provider]  allopurinol (ZYLOPRIM) 100 MG tablet Take 200 mg by mouth daily. 05/27/14   [provider]  aspirin EC 81 MG tablet Take 81 mg by mouth daily. Swallow whole.    [provider]  atenolol (TENORMIN) 50 MG tablet Take 50 mg by mouth daily. 02/15/16   [provider]  atorvastatin (LIPITOR) 10 MG tablet Take 1 tablet (10 mg total) by mouth daily. 01/11/23   Leeroy Bock, MD  benazepril (LOTENSIN) 40 MG tablet Take 40 mg by mouth daily.    [provider]  Coenzyme Q10 100 MG capsule Take 100 mg by mouth 2 (two) times daily.    [provider]  DULoxetine (CYMBALTA) 60 MG capsule Take 60 mg by mouth daily.    [provider]  fluticasone (FLONASE) 50 MCG/ACT nasal spray Place 2 sprays into both nostrils daily as needed.    [provider]  levothyroxine (SYNTHROID) 25 MCG tablet Take 25 mcg by mouth daily before breakfast. 10/13/18   [provider]  meloxicam (MOBIC) 7.5 MG tablet Take 1 tablet (7.5 mg total) by mouth daily as needed for pain. 01/10/23   Leeroy Bock, MD  metolazone (ZAROXOLYN) 2.5 MG tablet Take 2.5 mg by mouth every 7 (seven) days.    [provider]  Multiple Vitamins-Minerals (MULTIVITAL PO) Take 1 tablet by mouth daily.    [provider]  omega-3 acid ethyl esters (LOVAZA) 1 g capsule Take 2 capsules by mouth  2 (two) times daily.    [provider]  potassium chloride SA (K-DUR) 20 MEQ tablet Take 20 mEq by mouth daily. 09/06/18   [provider]  pregabalin (LYRICA) 100 MG capsule Take 1 capsule (100 mg total) by mouth 2 (two) times daily. 01/10/23 02/02/2023  Leeroy Bock, MD  RESTASIS 0.05 % ophthalmic emulsion Place 1 drop into both eyes 2 (two) times daily. 02/25/16   [provider]  SYMBICORT 160-4.5 MCG/ACT inhaler Inhale 2 puffs into the lungs 2 (two) times daily.    [provider]  torsemide 40 MG TABS Take 40 mg by mouth daily. 01/10/23   Leeroy Bock, MD  WEGOVY 0.5 MG/0.5ML SOAJ Inject 0.5 mg into the skin once a week. Saturday    [provider]   No Known Allergies Review of Systems  Unable to perform ROS   Physical Exam Constitutional:      Comments: Eyes closed  Pulmonary:     Comments: On BiPAP    Vital Signs: BP 93/69   Pulse 61   Temp 97.6 F (36.4 C) (Axillary)   Resp 20   SpO2 100%  Pain Scale: Faces       SpO2: SpO2: 100 % O2 Device:SpO2: 100 % O2 Flow Rate: .O2 Flow Rate (L/min): 15 L/min  IO: Intake/output summary: No intake or output data in the 24 hours ending 01/25/2023 1534  LBM:   Baseline Weight:   Most recent weight:         Signed by: Morton Stall, NP   Please contact Palliative Medicine Team phone at [phone] for questions and concerns.  For individual provider: See Loretha Stapler

## 2023-02-09 NOTE — Progress Notes (Addendum)
Pharmacy Antibiotic Note  Jay Sandoval is a 82 y.o. male admitted on 02/09/2023 with sepsis and AKI. PMH includes asthma, chronic kidney disease (non-HD), congestive heart failure, COPD, GERD, glaucoma, hemorrhoids, hyperlipidemia, hypertension, obesity, psoriasis, sleep apnea.  Pharmacy has been consulted for Zosyn dosing.   Plan: Zosyn (piperacillin/tazobactam) 2.25 grams every 8 hours   Height: 5\' 9"  (175.3 cm) IBW/kg (Calculated) : 70.7  Temp (24hrs), Avg:97 F (36.1 C), Min:96.5 F (35.8 C), Max:97.6 F (36.4 C)  Recent Labs  Lab 02/09/23 1116 02/09/23 1215 02/09/23 1332 02/09/23 1452  WBC 15.2*  --   --  8.4  CREATININE  --  6.97*  --  6.93*  LATICACIDVEN 3.6*  --  3.4*  --     CrCl cannot be calculated (Unknown ideal weight.).    No Known Allergies  Antimicrobials this admission: vancomcyin 10/17 >> 10/17 cefepime 10/17 >> 10/17  Dose adjustments this admission: N/a  Microbiology results: 10/17 BCx: in process 10/17 GI panel: to be collected 10/17 C. Diff screen: to be collected 10/17 MRSA PCR: not detected  Thank you for allowing pharmacy to be a part of this patient's care.    Elliot Gurney, PharmD, BCPS Clinical Pharmacist  02/09/2023 7:18 PM

## 2023-02-09 NOTE — IPAL (Signed)
  Interdisciplinary Goals of Care Family Meeting   Date carried out: 02/09/2023  Location of the meeting: Bedside  Member's involved: Physician, Bedside Registered Nurse, and Family Member or next of kin      GOALS OF CARE DISCUSSION  The Clinical status was relayed to family in detail- 2 [place]ie[place] as bedside  Updated and notified of patients medical condition- Patient remains unresponsive and will not open eyes to command.   Patient is having a weak cough and struggling to remove secretions.   Patient with increased WOB and using accessory muscles to breathe Explained to family course of therapy and the modalities   Patient with Progressive multiorgan failure with a very high probablity of a very minimal chance of meaningful recovery despite all aggressive and optimal medical therapy.  PATIENT REMAINS FULL CODE  Family understands the situation.   Additional CC time 25 mins   Jay Sandoval Jay Sandoval, M.D.  [facility]  Medical Director [facility] Medical Director [facility] Cardio-Pulmonary Department

## 2023-02-09 NOTE — ED Notes (Signed)
Scotty Court, MD, made aware of potassium 7.4

## 2023-02-09 NOTE — Consult Note (Signed)
[facility] Consult Note: 02/09/2023    Date of Admission:  02/09/2023           Reason for Consult:  Acute kidney injury, hyperkalemia   Referring Provider: Erin Fulling, MD Primary Care Provider: Barbette Reichmann, MD   History of Presenting Illness:  Jay Sandoval is a 82 y.o. male with medical problems of asthma, chronic kidney disease, congestive heart failure, COPD, GERD, glaucoma, hemorrhoids, hyperlipidemia, hypertension, obesity, psoriasis, sleep apnea.  Brought to the emergency room by EMS from home for complaints of diarrhea for 2 days and altered mental status.  Patient is not able to provide any information.  All information is obtained from the chart. He was discharged from [facility] 2 weeks ago with pneumonia.  Patient is bedbound.  Per notes, patient is alert and oriented at baseline.  Upon arrival to the ER he was found to be hypotensive with blood pressure of 84/50.  Blood glucose was 128. Initial labs showed a creatinine of 6.97, BUN 151 and potassium of 7.4. He was given shifting measures, IV bicarbonate, medical management.  Potassium has improved to 5.3 at 1452 on 02/09/2023. When seen, patient is on BiPAP.  He is not responding to voice or tactile stimulus.  Review of Systems: ROS-not available  Past Medical History:  Diagnosis Date   Arthritis    Asthma    Hypertension    Osteoporosis    Sleep apnea     Social History   Tobacco Use   Smoking status: Former    Current packs/day: 0.00    Types: Cigarettes    Quit date: 05/15/2015    Years since quitting: 7.7   Smokeless tobacco: Never  Substance Use Topics   Alcohol use: No   Drug use: Never    History reviewed. No pertinent family history.   OBJECTIVE: Blood pressure (!) 147/65, pulse 69, temperature 97.6 F (36.4 C), temperature source Axillary, resp. rate (!) 23, SpO2 100%.  Physical Exam General Appearance-critically ill-appearing, morbidly obese  gentleman HEENT: NIPPV mask in place Pulmonary: Coarse breath sounds bilaterally Cardiac: Irregular  Abdomen: Morbidly obese Extremities: Trace to 1+ dependent edema Neuro: Obtunded.  Lab Results Lab Results  Component Value Date   WBC 8.4 02/09/2023   HGB 10.7 (L) 02/09/2023   HCT 34.6 (L) 02/09/2023   MCV 98.6 02/09/2023   PLT 148 (L) 02/09/2023    Lab Results  Component Value Date   CREATININE 6.93 (H) 02/09/2023   BUN 151 (H) 02/09/2023   NA 139 02/09/2023   K 5.3 (H) 02/09/2023   CL 99 02/09/2023   CO2 24 02/09/2023    Lab Results  Component Value Date   ALT 67 (H) 02/09/2023   AST 148 (H) 02/09/2023   ALKPHOS 46 02/09/2023   BILITOT 0.5 02/09/2023     Microbiology: Recent Results (from the past 240 hour(s))  Resp panel by RT-PCR (RSV, Flu A&B, Covid) Anterior Nasal Swab     Status: None   Collection Time: 02/09/23 11:16 AM   Specimen: Anterior Nasal Swab  Result Value Ref Range Status   SARS Coronavirus 2 by RT PCR NEGATIVE NEGATIVE Final    Comment: (NOTE) SARS-CoV-2 target nucleic acids are NOT DETECTED.  The SARS-CoV-2 RNA is generally detectable in upper respiratory specimens during the acute phase of infection. The lowest concentration of SARS-CoV-2 viral copies this assay can detect is 138 copies/mL. A negative result does not preclude SARS-Cov-2 infection and should not be used as the  sole basis for treatment or other patient management decisions. A negative result may occur with  improper specimen collection/handling, submission of specimen other than nasopharyngeal swab, presence of viral mutation(s) within the areas targeted by this assay, and inadequate number of viral copies(<138 copies/mL). A negative result must be combined with clinical observations, patient history, and epidemiological information. The expected result is Negative.  Fact Sheet for Patients:  BloggerCourse.com  Fact Sheet for Healthcare Providers:   SeriousBroker.it  This test is no t yet approved or cleared by the [place] FDA and  has been authorized for detection and/or diagnosis of SARS-CoV-2 by FDA under an Emergency Use Authorization (EUA). This EUA will remain  in effect (meaning this test can be used) for the duration of the COVID-19 declaration under Section 564(b)(1) of the Act, 21 U.S.C.section 360bbb-3(b)(1), unless the authorization is terminated  or revoked sooner.       Influenza A by PCR NEGATIVE NEGATIVE Final   Influenza B by PCR NEGATIVE NEGATIVE Final    Comment: (NOTE) The Xpert Xpress SARS-CoV-2/FLU/RSV plus assay is intended as an aid in the diagnosis of influenza from Nasopharyngeal swab specimens and should not be used as a sole basis for treatment. Nasal washings and aspirates are unacceptable for Xpert Xpress SARS-CoV-2/FLU/RSV testing.  Fact Sheet for Patients: BloggerCourse.com  Fact Sheet for Healthcare Providers: SeriousBroker.it  This test is not yet approved or cleared by the [place] FDA and has been authorized for detection and/or diagnosis of SARS-CoV-2 by FDA under an Emergency Use Authorization (EUA). This EUA will remain in effect (meaning this test can be used) for the duration of the COVID-19 declaration under Section 564(b)(1) of the Act, 21 U.S.C. section 360bbb-3(b)(1), unless the authorization is terminated or revoked.     Resp Syncytial Virus by PCR NEGATIVE NEGATIVE Final    Comment: (NOTE) Fact Sheet for Patients: BloggerCourse.com  Fact Sheet for Healthcare Providers: SeriousBroker.it  This test is not yet approved or cleared by the [place] FDA and has been authorized for detection and/or diagnosis of SARS-CoV-2 by FDA under an Emergency Use Authorization (EUA). This EUA will remain in effect (meaning this test can be used) for  the duration of the COVID-19 declaration under Section 564(b)(1) of the Act, 21 U.S.C. section 360bbb-3(b)(1), unless the authorization is terminated or revoked.  Performed at [facility], [address] [place], [place] [address]     Medications: Scheduled Meds:  calcium gluconate  1 g Intravenous Once   heparin  5,000 Units Subcutaneous Q8H   hydrocortisone sod succinate (SOLU-CORTEF) inj  100 mg Intravenous Once   sodium chloride flush  10 mL Intravenous Q12H   sodium chloride flush  3 mL Intravenous Q12H   Continuous Infusions:  sodium chloride     fentaNYL infusion INTRAVENOUS     norepinephrine (LEVOPHED) Adult infusion 12 mcg/min (02/09/23 1622)   sodium bicarbonate 150 mEq in sterile water 1,150 mL infusion 125 mL/hr at 02/09/23 1404   PRN Meds:.acetaminophen, docusate sodium, ondansetron (ZOFRAN) IV, polyethylene glycol, sodium chloride flush  No Known Allergies  Urinalysis: Recent Labs    02/09/23 1332  COLORURINE YELLOW*  LABSPEC 1.016  PHURINE 5.0  GLUCOSEU NEGATIVE  HGBUR SMALL*  BILIRUBINUR NEGATIVE  KETONESUR NEGATIVE  PROTEINUR NEGATIVE  NITRITE NEGATIVE  LEUKOCYTESUR NEGATIVE      Imaging: DG Chest Port 1 View  Result Date: 02/09/2023 CLINICAL DATA:  Questionable sepsis - evaluate for abnormality EXAM: PORTABLE CHEST 1 VIEW COMPARISON:  Chest radiographs  dated May 18, 2009. FINDINGS: Patient is rotated to the left with slight obscuration of the left costophrenic angle. Low lung volumes with associated accentuation of the cardiomediastinal silhouette. Aortic calcification. Bibasilar streaky opacities. Left basilar atelectatic changes. No pneumothorax. No acute osseous abnormality. IMPRESSION: Suboptimal examination. Low lung volumes. Bibasilar streaky opacities may represent atelectasis or infiltrate. Electronically Signed   By: Hart Robinsons M.D.   On: 02/09/2023 13:25      Assessment/Plan:  JAAZIAH SCHULKE is a 82 y.o. male  with medical problems of  asthma, chronic kidney disease, congestive heart failure, COPD, GERD, glaucoma, hemorrhoids, hyperlipidemia, hypertension, obesity, psoriasis, sleep apnea   was admitted on 02/09/2023 for :  Acute respiratory failure with hypoxia (HCC) [J96.01]  Acute kidney injury Baseline creatinine of 1.16/GFR greater than 60 on 01/08/2023. Presenting creatinine of 6.97.  BUN 151. Concern for GI bleed in the setting of AKI causing high BUN level. Patient is currently undergoing volume resuscitation.  Monitor urine output closely. He may end up needing hemodialysis if urine output/metabolic parameters do not improve.  Severe hyperkalemia Home meds included potassium chloride supplementation, benazepril, meloxicam Agree with holding all of the above. Shifting measures have been provided, with repeat potassium of 5.3. Monitor closely.  Hypotension/sepsis May need pressor support. He has received empiric antibiotics vancomycin and cefepime.  Acute respiratory failure Requiring noninvasive positive pressure ventilation Initial ABG with pH of 7.18. Repeat pH of 7.4 at 3 PM.   Management as per ICU team.   Mosetta Pigeon 02/09/23

## 2023-02-09 NOTE — ED Notes (Signed)
Scotty Court, MD, made aware of BP 62/55. NS bolus ordered.

## 2023-02-09 NOTE — ED Triage Notes (Signed)
Pt to ED via [facility] from home for c/o diarrhea x2 days and altered mental status. Pt discharged from [facility] 2 weeks ago with pneumonia. Pt ordered 3L nasal cannula, unsure if compliant. Pt bedbound, found by family with altered mental status, somnolent, not answering questions. Pt A&O at baseline. Pt 80% on room air on EMS arrival, placed on non-rebreather and maintained 93%.  Temp 96.7 ax BP 84/50 HR 65 Cbg 128

## 2023-02-09 NOTE — ED Notes (Signed)
Bariatric  bed  called   for

## 2023-02-10 ENCOUNTER — Inpatient Hospital Stay (HOSPITAL_COMMUNITY)
Admit: 2023-02-10 | Discharge: 2023-02-10 | Disposition: A | Discharge status: Home / Self Care | Payer: 59 | Attending: Internal Medicine | Admitting: Internal Medicine

## 2023-02-10 DIAGNOSIS — K922 Gastrointestinal hemorrhage, unspecified: Secondary | ICD-10-CM | POA: Diagnosis not present

## 2023-02-10 DIAGNOSIS — J9601 Acute respiratory failure with hypoxia: Secondary | ICD-10-CM | POA: Diagnosis not present

## 2023-02-10 DIAGNOSIS — Z515 Encounter for palliative care: Secondary | ICD-10-CM

## 2023-02-10 DIAGNOSIS — R0603 Acute respiratory distress: Secondary | ICD-10-CM

## 2023-02-10 DIAGNOSIS — N179 Acute kidney failure, unspecified: Secondary | ICD-10-CM | POA: Diagnosis not present

## 2023-02-10 LAB — CBC
HCT: 41 % (ref 39.0–52.0)
HCT: 38.3 % — ABNORMAL LOW (ref 39.0–52.0)
Hemoglobin: 13.5 g/dL (ref 13.0–17.0)
Hemoglobin: 12.9 g/dL — ABNORMAL LOW (ref 13.0–17.0)
MCH: 30.9 pg (ref 26.0–34.0)
MCH: 30.6 pg (ref 26.0–34.0)
MCHC: 32.9 g/dL (ref 30.0–36.0)
MCHC: 33.7 g/dL (ref 30.0–36.0)
MCV: 93.8 fL (ref 80.0–100.0)
MCV: 90.8 fL (ref 80.0–100.0)
Platelets: 168 10*3/uL (ref 150–400)
Platelets: 175 10*3/uL (ref 150–400)
RBC: 4.37 MIL/uL (ref 4.22–5.81)
RBC: 4.22 MIL/uL (ref 4.22–5.81)
RDW: 15.1 % (ref 11.5–15.5)
RDW: 15.3 % (ref 11.5–15.5)
WBC: 4.3 10*3/uL (ref 4.0–10.5)
WBC: 5.1 10*3/uL (ref 4.0–10.5)
nRBC: 16.8 % — ABNORMAL HIGH (ref 0.0–0.2)
nRBC: 23.5 % — ABNORMAL HIGH (ref 0.0–0.2)

## 2023-02-10 LAB — CBC WITH DIFFERENTIAL/PLATELET
Abs Immature Granulocytes: 0.08 10*3/uL — ABNORMAL HIGH (ref 0.00–0.07)
Basophils Absolute: 0.1 10*3/uL (ref 0.0–0.1)
Basophils Relative: 1 %
Eosinophils Absolute: 0 10*3/uL (ref 0.0–0.5)
Eosinophils Relative: 0 %
HCT: 38.9 % — ABNORMAL LOW (ref 39.0–52.0)
Hemoglobin: 12.6 g/dL — ABNORMAL LOW (ref 13.0–17.0)
Immature Granulocytes: 1 %
Lymphocytes Relative: 8 %
Lymphs Abs: 0.5 10*3/uL — ABNORMAL LOW (ref 0.7–4.0)
MCH: 30.1 pg (ref 26.0–34.0)
MCHC: 32.4 g/dL (ref 30.0–36.0)
MCV: 93.1 fL (ref 80.0–100.0)
Monocytes Absolute: 0.7 10*3/uL (ref 0.1–1.0)
Monocytes Relative: 10 %
Neutro Abs: 5.5 10*3/uL (ref 1.7–7.7)
Neutrophils Relative %: 80 %
Platelets: 173 10*3/uL (ref 150–400)
RBC: 4.18 MIL/uL — ABNORMAL LOW (ref 4.22–5.81)
RDW: 15.4 % (ref 11.5–15.5)
WBC: 6.9 10*3/uL (ref 4.0–10.5)
nRBC: 12.9 % — ABNORMAL HIGH (ref 0.0–0.2)

## 2023-02-10 LAB — TYPE AND SCREEN
ABO/RH(D): O POS
Antibody Screen: NEGATIVE
Unit division: 0
Unit division: 0

## 2023-02-10 LAB — BPAM RBC
Blood Product Expiration Date: 202411132359
Blood Product Expiration Date: 202411132359
ISSUE DATE / TIME: 202410171530
ISSUE DATE / TIME: 202410171829
Unit Type and Rh: 5100
Unit Type and Rh: 5100

## 2023-02-10 LAB — BLOOD GAS, ARTERIAL
Acid-base deficit: 2 mmol/L (ref 0.0–2.0)
Bicarbonate: 23.7 mmol/L (ref 20.0–28.0)
FIO2: 70 %
MECHVT: 550 mL
Mechanical Rate: 16
O2 Saturation: 96.5 %
PEEP: 8 cmH2O
Patient temperature: 37
pCO2 arterial: 43 mm[Hg] (ref 32–48)
pH, Arterial: 7.35 (ref 7.35–7.45)
pO2, Arterial: 75 mm[Hg] — ABNORMAL LOW (ref 83–108)

## 2023-02-10 LAB — GLUCOSE, CAPILLARY
Glucose-Capillary: 115 mg/dL — ABNORMAL HIGH (ref 70–99)
Glucose-Capillary: 128 mg/dL — ABNORMAL HIGH (ref 70–99)
Glucose-Capillary: 140 mg/dL — ABNORMAL HIGH (ref 70–99)
Glucose-Capillary: 147 mg/dL — ABNORMAL HIGH (ref 70–99)
Glucose-Capillary: 149 mg/dL — ABNORMAL HIGH (ref 70–99)
Glucose-Capillary: 171 mg/dL — ABNORMAL HIGH (ref 70–99)

## 2023-02-10 LAB — COMPREHENSIVE METABOLIC PANEL
ALT: 314 U/L — ABNORMAL HIGH (ref 0–44)
AST: 541 U/L — ABNORMAL HIGH (ref 15–41)
Albumin: 2.5 g/dL — ABNORMAL LOW (ref 3.5–5.0)
Alkaline Phosphatase: 50 U/L (ref 38–126)
Anion gap: 17 — ABNORMAL HIGH (ref 5–15)
BUN: 144 mg/dL — ABNORMAL HIGH (ref 8–23)
CO2: 25 mmol/L (ref 22–32)
Calcium: 8.8 mg/dL — ABNORMAL LOW (ref 8.9–10.3)
Chloride: 99 mmol/L (ref 98–111)
Creatinine, Ser: 6.78 mg/dL — ABNORMAL HIGH (ref 0.61–1.24)
GFR, Estimated: 8 mL/min — ABNORMAL LOW (ref >60)
Glucose, Bld: 142 mg/dL — ABNORMAL HIGH (ref 70–99)
Potassium: 5.8 mmol/L — ABNORMAL HIGH (ref 3.5–5.1)
Sodium: 141 mmol/L (ref 135–145)
Total Bilirubin: 1.2 mg/dL (ref 0.3–1.2)
Total Protein: 6.1 g/dL — ABNORMAL LOW (ref 6.5–8.1)

## 2023-02-10 LAB — ECHOCARDIOGRAM COMPLETE
Height: 69.016 in
S' Lateral: 2.2 cm
Weight: 6334.4 [oz_av]

## 2023-02-10 LAB — BLOOD CULTURE ID PANEL (REFLEXED) - BCID2

## 2023-02-10 LAB — BASIC METABOLIC PANEL
Anion gap: 16 — ABNORMAL HIGH (ref 5–15)
BUN: 138 mg/dL — ABNORMAL HIGH (ref 8–23)
CO2: 25 mmol/L (ref 22–32)
Calcium: 8.6 mg/dL — ABNORMAL LOW (ref 8.9–10.3)
Chloride: 101 mmol/L (ref 98–111)
Creatinine, Ser: 6.75 mg/dL — ABNORMAL HIGH (ref 0.61–1.24)
GFR, Estimated: 8 mL/min — ABNORMAL LOW (ref >60)
Glucose, Bld: 152 mg/dL — ABNORMAL HIGH (ref 70–99)
Potassium: 4.9 mmol/L (ref 3.5–5.1)
Sodium: 142 mmol/L (ref 135–145)

## 2023-02-10 LAB — PHOSPHORUS: Phosphorus: 6.4 mg/dL — ABNORMAL HIGH (ref 2.5–4.6)

## 2023-02-10 LAB — MAGNESIUM: Magnesium: 2.5 mg/dL — ABNORMAL HIGH (ref 1.7–2.4)

## 2023-02-10 LAB — C DIFFICILE QUICK SCREEN W PCR REFLEX
C Diff antigen: NEGATIVE
C Diff interpretation: NOT DETECTED
C Diff toxin: NEGATIVE

## 2023-02-10 LAB — LACTIC ACID, PLASMA: Lactic Acid, Venous: 2.9 mmol/L (ref 0.5–1.9)

## 2023-02-10 MED ORDER — INSULIN ASPART 100 UNIT/ML IV SOLN
10.0000 [IU] | Freq: Once | INTRAVENOUS | Status: AC
  Administered 2023-02-10: 10 [IU] via INTRAVENOUS
  Filled 2023-02-10: qty 0.1

## 2023-02-10 MED ORDER — BUDESONIDE 0.5 MG/2ML IN SUSP
0.5000 mg | Freq: Two times a day (BID) | RESPIRATORY_TRACT | Status: DC
  Administered 2023-02-10 – 2023-02-14 (×8): 0.5 mg via RESPIRATORY_TRACT
  Filled 2023-02-10 (×8): qty 2

## 2023-02-10 MED ORDER — HYDROCORTISONE SOD SUC (PF) 100 MG IJ SOLR
50.0000 mg | Freq: Four times a day (QID) | INTRAMUSCULAR | Status: DC
  Administered 2023-02-10 – 2023-02-13 (×12): 50 mg via INTRAVENOUS
  Filled 2023-02-10 (×10): qty 2
  Filled 2023-02-10: qty 1
  Filled 2023-02-10: qty 2

## 2023-02-10 MED ORDER — LACTATED RINGERS IV BOLUS
1000.0000 mL | Freq: Once | INTRAVENOUS | Status: AC
  Administered 2023-02-10: 1000 mL via INTRAVENOUS

## 2023-02-10 MED ORDER — PANTOPRAZOLE SODIUM 40 MG IV SOLR
40.0000 mg | Freq: Two times a day (BID) | INTRAVENOUS | Status: DC
  Administered 2023-02-10 – 2023-02-14 (×9): 40 mg via INTRAVENOUS
  Filled 2023-02-10 (×9): qty 10

## 2023-02-10 MED ORDER — PANTOPRAZOLE SODIUM 40 MG IV SOLR: 40.0000 mg | Freq: Every day | INTRAVENOUS | Status: DC

## 2023-02-10 MED ORDER — HEPARIN SODIUM (PORCINE) 5000 UNIT/ML IJ SOLN: 5000.0000 [IU] | Freq: Three times a day (TID) | INTRAMUSCULAR | Status: DC

## 2023-02-10 MED ORDER — ORAL CARE MOUTH RINSE: 15.0000 mL | OROMUCOSAL | Status: DC | PRN

## 2023-02-10 MED ORDER — IPRATROPIUM-ALBUTEROL 0.5-2.5 (3) MG/3ML IN SOLN
3.0000 mL | RESPIRATORY_TRACT | Status: DC
  Administered 2023-02-10 – 2023-02-13 (×18): 3 mL via RESPIRATORY_TRACT
  Filled 2023-02-10 (×18): qty 3

## 2023-02-10 MED ORDER — DEXTROSE 50 % IV SOLN
1.0000 | Freq: Once | INTRAVENOUS | Status: AC
  Administered 2023-02-10: 50 mL via INTRAVENOUS
  Filled 2023-02-10: qty 50

## 2023-02-10 MED ORDER — ORAL CARE MOUTH RINSE
15.0000 mL | OROMUCOSAL | Status: DC
  Administered 2023-02-10 – 2023-02-14 (×55): 15 mL via OROMUCOSAL

## 2023-02-10 NOTE — Progress Notes (Signed)
*  PRELIMINARY RESULTS* Echocardiogram 2D Echocardiogram has been performed.  Jay Sandoval 02/10/2023, 7:59 AM

## 2023-02-10 NOTE — IPAL (Signed)
  Interdisciplinary Goals of Care Family Meeting   Date carried out: 02/10/2023  Location of the meeting: Conference room  Member's involved: Physician, Family Member or next of kin, and Palliative care team member    GOALS OF CARE DISCUSSION  The Clinical status was relayed to family in detail- 2 [place]  Updated and notified of patients medical condition- Patient remains unresponsive and will not open eyes to command.   Patient is having a weak cough and struggling to remove secretions.   Patient with increased WOB and using accessory muscles to breathe Explained to family course of therapy and the modalities   Patient with Progressive multiorgan failure with a very high probablity of a very minimal chance of meaningful recovery despite all aggressive and optimal medical therapy.    Family understands the situation. Morbidly Obese, Multiple organ failure  They have consented and agreed to DNR.  Family are satisfied with Plan of action and management. All questions answered  Additional CC time 35 mins   Jay Sandoval Jay Sandoval, M.D.  [facility]  Medical Director [facility] Medical Director [facility] Cardio-Pulmonary Department

## 2023-02-10 NOTE — Consult Note (Cosign Needed Addendum)
PHARMACY - PHYSICIAN COMMUNICATION CRITICAL VALUE ALERT - BLOOD CULTURE IDENTIFICATION (BCID)  Jay Sandoval is an 82 y.o. male who presented to [facility] on 01/27/2023 with a chief complaint of acute hypoxia resp failure due to underlying pneumonia with acute cardiac failure. On mechanical ventilator support.   Assessment:  Bcx from 10/17 returned as 1 of 4 bottles GPC; BCID methicillin resistant S. epidermidis. Appears to be consistent with contaminant.   Name of physician (or Provider) Contacted: Dr. Erin Fulling   Current antibiotics:  Zosyn 2.25g IV Q8H  Changes to prescribed antibiotics recommended:  No new orders at this time  Results for orders placed or performed during the hospital encounter of 02/04/2023  Blood Culture ID Panel (Reflexed) (Collected: 01/31/2023 11:17 AM)  Result Value Ref Range   Enterococcus faecalis NOT DETECTED NOT DETECTED   Enterococcus Faecium NOT DETECTED NOT DETECTED   Listeria monocytogenes NOT DETECTED NOT DETECTED   Staphylococcus species DETECTED (A) NOT DETECTED   Staphylococcus aureus (BCID) NOT DETECTED NOT DETECTED   Staphylococcus epidermidis DETECTED (A) NOT DETECTED   Staphylococcus lugdunensis NOT DETECTED NOT DETECTED   Streptococcus species NOT DETECTED NOT DETECTED   Streptococcus agalactiae NOT DETECTED NOT DETECTED   Streptococcus pneumoniae NOT DETECTED NOT DETECTED   Streptococcus pyogenes NOT DETECTED NOT DETECTED   A.calcoaceticus-baumannii NOT DETECTED NOT DETECTED   Bacteroides fragilis NOT DETECTED NOT DETECTED   Enterobacterales NOT DETECTED NOT DETECTED   Enterobacter cloacae complex NOT DETECTED NOT DETECTED   Escherichia coli NOT DETECTED NOT DETECTED   Klebsiella aerogenes NOT DETECTED NOT DETECTED   Klebsiella oxytoca NOT DETECTED NOT DETECTED   Klebsiella pneumoniae NOT DETECTED NOT DETECTED   Proteus species NOT DETECTED NOT DETECTED   Salmonella species NOT DETECTED NOT DETECTED   Serratia marcescens NOT  DETECTED NOT DETECTED   Haemophilus influenzae NOT DETECTED NOT DETECTED   Neisseria meningitidis NOT DETECTED NOT DETECTED   Pseudomonas aeruginosa NOT DETECTED NOT DETECTED   Stenotrophomonas maltophilia NOT DETECTED NOT DETECTED   Candida albicans NOT DETECTED NOT DETECTED   Candida auris NOT DETECTED NOT DETECTED   Candida glabrata NOT DETECTED NOT DETECTED   Candida krusei NOT DETECTED NOT DETECTED   Candida parapsilosis NOT DETECTED NOT DETECTED   Candida tropicalis NOT DETECTED NOT DETECTED   Cryptococcus neoformans/gattii NOT DETECTED NOT DETECTED   Methicillin resistance mecA/C DETECTED (A) NOT DETECTED    Effie Shy, PharmD Pharmacy Resident  02/10/2023 11:16 AM

## 2023-02-10 NOTE — Plan of Care (Signed)
CHL Tonsillectomy/Adenoidectomy, Postoperative PEDS care plan entered in error.

## 2023-02-10 NOTE — Progress Notes (Signed)
Initial Nutrition Assessment  DOCUMENTATION CODES:   Morbid obesity  INTERVENTION:   -If unable to extubate within 48 hours and pt desires aggressive care:  Initiate Vital 1.5 @ 20 ml/hr via OGT and increase by 10 ml every 4 hours to goal rate of 40 ml/hr.   60 ml Prosource TF 5 times daily  If no IVFs, consider 165 ml free water flush very 4 hours  Tube feeding regimen provides 1840 kcal (100% of needs), 165 grams of protein, and 733 ml of H2O. Total free water: 1723 ml daily  NUTRITION DIAGNOSIS:   Inadequate oral intake related to inability to eat as evidenced by NPO status.  GOAL:   Provide needs based on ASPEN/SCCM guidelines  MONITOR:   Vent status  REASON FOR ASSESSMENT:   Ventilator    ASSESSMENT:   Pt admitted for acute hypoxic resp failure due to probable underlying pneumonia with probable acute cardiac failure with shock likely due to combination of sepsis/cardiogenic/Hypovolumic complicated by severe metabolic acidosis and acute metabolic encephalopathy with  acute renal failure  Pt admitted with severe multi-organ failure.   10/17- intubated for aiway protection secondary to unresponsive state  Patient is currently intubated on ventilator support. OGT placed; currently on low, intermittent suction. Per KUB on 02/16/2023; tip of tube and side port in the stomach.  MV: 9 L/min Temp (24hrs), Avg:97.7 F (36.5 C), Min:96.1 F (35.6 C), Max:99 F (37.2 C)  Reviewed I/O's: +2.7 L x 24 hours   UOP: 280 ml x 24 hours  MAP: 93  Per nephrology notes, pt with AKI; no plans for HD at this time, but will continue to monitor closely.   Case discussed with RN and MD. No plans for TF today.   Reviewed wt hx; wt has been stable over the past 2 months.   Palliative care consult pending for goals of care discussions. Per MD notes, pt with poor prognosis and low likelihood for meaningful recovery.   Medications reviewed and include colace, solu-cortef, protonix,  miralax, fentanyl, levophed, and vasopressin.   Obesity is a complex, chronic medical condition that is optimally managed by a multidisciplinary care team. Weight loss is not an ideal goal for an acute inpatient hospitalization. However, if further work-up for obesity is warranted, consider outpatient referral to [facility].    No results found for: "HGBA1C" PTA DM medications are .   Labs reviewed: K: 5.8, Phos: 6.4, Mg: 2.5, CBGS: 115-140 (inpatient orders for glycemic control are ).    NUTRITION - FOCUSED PHYSICAL EXAM:  Flowsheet Row Most Recent Value  Orbital Region No depletion  Upper Arm Region No depletion  Thoracic and Lumbar Region No depletion  Buccal Region No depletion  Temple Region No depletion  Clavicle Bone Region No depletion  Clavicle and Acromion Bone Region No depletion  Scapular Bone Region No depletion  Dorsal Hand No depletion  Patellar Region No depletion  Anterior Thigh Region No depletion  Posterior Calf Region No depletion  Edema (RD Assessment) Mild  Hair Reviewed  Eyes Reviewed  Mouth Reviewed  Skin Reviewed  Nails Reviewed       Diet Order:   Diet Order             Diet NPO time specified  Diet effective now                   EDUCATION NEEDS:   Not appropriate for education at this time  Skin:  Skin Assessment: Reviewed RN Assessment  Last BM:  02/10/23 (type 6)  Height:   Ht Readings from Last 1 Encounters:  02/18/2023 5' 9.02" (1.753 m)    Weight:   Wt Readings from Last 1 Encounters:  02/10/23 (!) 179.6 kg    Ideal Body Weight:  72.7 kg  BMI:  Body mass index is 58.44 kg/m.  Estimated Nutritional Needs:   Kcal:  6440-3474  Protein:  165-180 grams  Fluid:  > 1.5 L    Levada Schilling, RD, LDN, CDCES Registered Dietitian III Certified Diabetes Care and Education Specialist Please refer to [facility] for RD and/or RD on-call/weekend/after hours pager

## 2023-02-10 NOTE — Plan of Care (Signed)
  Problem: Respiratory: Goal: Will regain and/or maintain adequate ventilation Outcome: Progressing   Problem: Clinical Measurements: Goal: Diagnostic test results will improve Outcome: Progressing Goal: Respiratory complications will improve Outcome: Progressing Goal: Cardiovascular complication will be avoided Outcome: Progressing   Problem: Elimination: Goal: Will not experience complications related to bowel motility Outcome: Progressing Goal: Will not experience complications related to urinary retention Outcome: Progressing

## 2023-02-10 NOTE — Progress Notes (Signed)
Pharmacy Antibiotic Note  Jay Sandoval is a 82 y.o. male admitted on 02/17/2023 with sepsis and AKI. PMH includes asthma, chronic kidney disease (non-HD), congestive heart failure, COPD, GERD, glaucoma, hemorrhoids, hyperlipidemia, hypertension, obesity, psoriasis, sleep apnea.  Pharmacy has been consulted for Zosyn dosing.  Today, 02/10/2023 Day 2 of antibiotics  WBC WNL  Afebrile   Plan: Continue Zosyn 2.25 grams Q8H F/u dialysis plans and adjust Zosyn dosing as appropriate  F/u GI panel   Height: 5' 9.02" (175.3 cm) Weight: (!) 179.6 kg (395 lb 14.4 oz) IBW/kg (Calculated) : 70.74  Temp (24hrs), Avg:97.4 F (36.3 C), Min:96.1 F (35.6 C), Max:98.6 F (37 C)  Recent Labs  Lab 2023/02/17 1116 2023/02/17 1215 February 17, 2023 1332 02/17/2023 1452 Feb 17, 2023 1829 02-17-23 2147 02-17-2023 2312 02/10/23 0438  WBC 15.2*  --   --  8.4  --   --  4.3 5.1  CREATININE  --  6.97*  --  6.93* 6.68*  --   --  6.78*  LATICACIDVEN 3.6*  --  3.4*  --   --  3.2* 2.9* 2.9*    Estimated Creatinine Clearance: 14 mL/min (A) (by C-G formula based on SCr of 6.78 mg/dL (H)).    No Known Allergies  Antimicrobials this admission: vancomcyin 02/17/2023 >> February 17, 2023 cefepime 02/17/2023 >> Feb 17, 2023  Dose adjustments this admission: N/A  Microbiology results: 2023/02/17 BCx: 1/4 MRSE - likely contaminant  02/17/23 C. Diff screen: negative  02/17/2023 MRSA PCR: not detected 10/18 GI panel: ordered   Thank you for allowing pharmacy to be a part of this patient's care.  Littie Deeds, PharmD Pharmacy Resident  02/10/2023 11:07 AM

## 2023-02-10 NOTE — Progress Notes (Addendum)
[facility]  ROUNDING NOTE   Subjective:   Patient seen and evaluated at bedside in ICU Sedation: Fentanyl Vent- FiO2 70% with 8 PEEP OG tube with dark aspirate Pressors: Levo and Vaso  Urine output  Objective:  Vital signs in last 24 hours:  Temp:  [96.1 F (35.6 C)-99 F (37.2 C)] 98.4 F (36.9 C) (10/18 1130) Pulse Rate:  [38-79] 77 (10/18 1130) Resp:  [0-26] 16 (10/18 1130) BP: (56-173)/(17-114) 75/40 (10/18 1130) SpO2:  [2 %-100 %] 90 % (10/18 1130) FiO2 (%):  [40 %-100 %] 70 % (10/18 1156) Weight:  [167.9 kg-182 kg] 179.6 kg (10/18 0500)  Weight change:  Filed Weights   02-26-23 1956 02/26/23 2200 02/10/23 0500  Weight: (!) 167.9 kg (!) 182 kg (!) 179.6 kg    Intake/Output: I/O last 3 completed shifts: In: 2978.4 [I.V.:1642.3; Blood:1186.1; Other:50; NG/GT:50; IV Piggyback:50] Out: 280 [Urine:280]   Intake/Output this shift:  Total I/O In: 222.1 [I.V.:222.1] Out: 430 [Urine:200; Emesis/NG output:230]  Physical Exam: General: Ill appearing  Head: Normocephalic, atraumatic. Moist oral mucosal membranes  Eyes: Anicteric  Lungs:  Intubated and vent  Heart: Regular rate and rhythm  Abdomen:  Soft, nontender  Extremities:  Trace peripheral edema.  Neurologic: Sedated  Skin: No lesions       Basic Metabolic Panel: Recent Labs  Lab February 26, 2023 1215 2023-02-26 1452 2023-02-26 1829 02/10/23 0438  NA 139  --  140 141  K 7.4* 5.3* 5.6* 5.8*  CL 99  --  100 99  CO2 24  --  22 25  GLUCOSE 125*  --  147* 142*  BUN 151*  --  145* 144*  CREATININE 6.97* 6.93* 6.68* 6.78*  CALCIUM 9.1  --  9.1 8.8*  MG  --   --  2.8* 2.5*  PHOS  --   --  6.0* 6.4*    Liver Function Tests: Recent Labs  Lab 2023-02-26 1215 02/10/23 0438  AST 148* 541*  ALT 67* 314*  ALKPHOS 46 50  BILITOT 0.5 1.2  PROT 6.7 6.1*  ALBUMIN 2.8* 2.5*   Recent Labs  Lab 02-26-23 1215  LIPASE 31   No results for input(s): "AMMONIA" in the last 168  hours.  CBC: Recent Labs  Lab 2023-02-26 1116 26-Feb-2023 1452 02-26-23 2312 02/10/23 0438  WBC 15.2* 8.4 4.3 5.1  NEUTROABS 8.2*  --   --   --   HGB 12.8* 10.7* 13.5 12.9*  HCT 41.1 34.6* 41.0 38.3*  MCV 97.9 98.6 93.8 90.8  PLT 204 148* 168 175    Cardiac Enzymes: No results for input(s): "CKTOTAL", "CKMB", "CKMBINDEX", "TROPONINI" in the last 168 hours.  BNP: Invalid input(s): "POCBNP"  CBG: Recent Labs  Lab 02/26/23 1721 02/10/23 0120 02/10/23 0448 02/10/23 0803  GLUCAP 95 128* 140* 115*    Microbiology: Results for orders placed or performed during the hospital encounter of 02-26-2023  Resp panel by RT-PCR (RSV, Flu A&B, Covid) Anterior Nasal Swab     Status: None   Collection Time: 26-Feb-2023 11:16 AM   Specimen: Anterior Nasal Swab  Result Value Ref Range Status   SARS Coronavirus 2 by RT PCR NEGATIVE NEGATIVE Final    Comment: (NOTE) SARS-CoV-2 target nucleic acids are NOT DETECTED.  The SARS-CoV-2 RNA is generally detectable in upper respiratory specimens during the acute phase of infection. The lowest concentration of SARS-CoV-2 viral copies this assay can detect is 138 copies/mL. A negative result does not preclude SARS-Cov-2 infection and should not be  used as the sole basis for treatment or other patient management decisions. A negative result may occur with  improper specimen collection/handling, submission of specimen other than nasopharyngeal swab, presence of viral mutation(s) within the areas targeted by this assay, and inadequate number of viral copies(<138 copies/mL). A negative result must be combined with clinical observations, patient history, and epidemiological information. The expected result is Negative.  Fact Sheet for Patients:  BloggerCourse.com  Fact Sheet for Healthcare Providers:  SeriousBroker.it  This test is no t yet approved or cleared by the [place] FDA and  has been  authorized for detection and/or diagnosis of SARS-CoV-2 by FDA under an Emergency Use Authorization (EUA). This EUA will remain  in effect (meaning this test can be used) for the duration of the COVID-19 declaration under Section 564(b)(1) of the Act, 21 U.S.C.section 360bbb-3(b)(1), unless the authorization is terminated  or revoked sooner.       Influenza A by PCR NEGATIVE NEGATIVE Final   Influenza B by PCR NEGATIVE NEGATIVE Final    Comment: (NOTE) The Xpert Xpress SARS-CoV-2/FLU/RSV plus assay is intended as an aid in the diagnosis of influenza from Nasopharyngeal swab specimens and should not be used as a sole basis for treatment. Nasal washings and aspirates are unacceptable for Xpert Xpress SARS-CoV-2/FLU/RSV testing.  Fact Sheet for Patients: BloggerCourse.com  Fact Sheet for Healthcare Providers: SeriousBroker.it  This test is not yet approved or cleared by the [place] FDA and has been authorized for detection and/or diagnosis of SARS-CoV-2 by FDA under an Emergency Use Authorization (EUA). This EUA will remain in effect (meaning this test can be used) for the duration of the COVID-19 declaration under Section 564(b)(1) of the Act, 21 U.S.C. section 360bbb-3(b)(1), unless the authorization is terminated or revoked.     Resp Syncytial Virus by PCR NEGATIVE NEGATIVE Final    Comment: (NOTE) Fact Sheet for Patients: BloggerCourse.com  Fact Sheet for Healthcare Providers: SeriousBroker.it  This test is not yet approved or cleared by the [place] FDA and has been authorized for detection and/or diagnosis of SARS-CoV-2 by FDA under an Emergency Use Authorization (EUA). This EUA will remain in effect (meaning this test can be used) for the duration of the COVID-19 declaration under Section 564(b)(1) of the Act, 21 U.S.C. section 360bbb-3(b)(1), unless the  authorization is terminated or revoked.  Performed at [facility], [address] [place], [place] [address]   Blood Culture (routine x 2)     Status: None (Preliminary result)   Collection Time: 02/07/2023 11:16 AM   Specimen: BLOOD  Result Value Ref Range Status   Specimen Description BLOOD LEFT AC  Final   Special Requests   Final    BOTTLES DRAWN AEROBIC AND ANAEROBIC Blood Culture adequate volume   Culture   Final    NO GROWTH < 24 HOURS Performed at [facility], [address] [place], [place] [address]    Report Status PENDING  Incomplete  Blood Culture (routine x 2)     Status: None (Preliminary result)   Collection Time: 02/10/2023 11:17 AM   Specimen: BLOOD  Result Value Ref Range Status   Specimen Description BLOOD BLOOD RIGHT ARM  Final   Special Requests   Final    BOTTLES DRAWN AEROBIC AND ANAEROBIC Blood Culture adequate volume   Culture  Setup Time   Final    GRAM POSITIVE COCCI AEROBIC BOTTLE ONLY Organism ID to follow CRITICAL RESULT CALLED TO, READ BACK BY AND VERIFIED  WITH: Queen Blossom, PHARMD AT 1052 ON 02/10/23 BY GM Performed at [facility], [address] [place], [place] [address]    Culture [facility] POSITIVE COCCI  Final   Report Status PENDING  Incomplete  Blood Culture ID Panel (Reflexed)     Status: Abnormal   Collection Time: 02/04/2023 11:17 AM  Result Value Ref Range Status   Enterococcus faecalis NOT DETECTED NOT DETECTED Final   Enterococcus Faecium NOT DETECTED NOT DETECTED Final   Listeria monocytogenes NOT DETECTED NOT DETECTED Final   Staphylococcus species DETECTED (A) NOT DETECTED Final    Comment: CRITICAL RESULT CALLED TO, READ BACK BY AND VERIFIED WITH: KRISTEN MERRILL, PHARMD AT 1052 ON 02/10/23 BY GM    Staphylococcus aureus (BCID) NOT DETECTED NOT DETECTED Final   Staphylococcus epidermidis DETECTED (A) NOT DETECTED Final    Comment: Methicillin (oxacillin) resistant coagulase negative  staphylococcus. Possible blood culture contaminant (unless isolated from more than one blood culture draw or clinical case suggests pathogenicity). No antibiotic treatment is indicated for blood  culture contaminants. CRITICAL RESULT CALLED TO, READ BACK BY AND VERIFIED WITH: KRISTEN MERRILL, PHARMD AT 1052 ON 02/10/23 BY GM    Staphylococcus lugdunensis NOT DETECTED NOT DETECTED Final   Streptococcus species NOT DETECTED NOT DETECTED Final   Streptococcus agalactiae NOT DETECTED NOT DETECTED Final   Streptococcus pneumoniae NOT DETECTED NOT DETECTED Final   Streptococcus pyogenes NOT DETECTED NOT DETECTED Final   A.calcoaceticus-baumannii NOT DETECTED NOT DETECTED Final   Bacteroides fragilis NOT DETECTED NOT DETECTED Final   Enterobacterales NOT DETECTED NOT DETECTED Final   Enterobacter cloacae complex NOT DETECTED NOT DETECTED Final   Escherichia coli NOT DETECTED NOT DETECTED Final   Klebsiella aerogenes NOT DETECTED NOT DETECTED Final   Klebsiella oxytoca NOT DETECTED NOT DETECTED Final   Klebsiella pneumoniae NOT DETECTED NOT DETECTED Final   Proteus species NOT DETECTED NOT DETECTED Final   Salmonella species NOT DETECTED NOT DETECTED Final   Serratia marcescens NOT DETECTED NOT DETECTED Final   Haemophilus influenzae NOT DETECTED NOT DETECTED Final   Neisseria meningitidis NOT DETECTED NOT DETECTED Final   Pseudomonas aeruginosa NOT DETECTED NOT DETECTED Final   Stenotrophomonas maltophilia NOT DETECTED NOT DETECTED Final   Candida albicans NOT DETECTED NOT DETECTED Final   Candida auris NOT DETECTED NOT DETECTED Final   Candida glabrata NOT DETECTED NOT DETECTED Final   Candida krusei NOT DETECTED NOT DETECTED Final   Candida parapsilosis NOT DETECTED NOT DETECTED Final   Candida tropicalis NOT DETECTED NOT DETECTED Final   Cryptococcus neoformans/gattii NOT DETECTED NOT DETECTED Final   Methicillin resistance mecA/C DETECTED (A) NOT DETECTED Final    Comment: CRITICAL  RESULT CALLED TO, READ BACK BY AND VERIFIED WITH: Queen Blossom, PHARMD AT 1052 ON 02/10/23 BY GM Performed at [facility], [address] [place], [place] [address]   MRSA Next Gen by PCR, Nasal     Status: None   Collection Time: 02/20/2023  5:23 PM   Specimen: Nasal Mucosa; Nasal Swab  Result Value Ref Range Status   MRSA by PCR Next Gen NOT DETECTED NOT DETECTED Final    Comment: (NOTE) The GeneXpert MRSA Assay (FDA approved for NASAL specimens only), is one component of a comprehensive MRSA colonization surveillance program. It is not intended to diagnose MRSA infection nor to guide or monitor treatment for MRSA infections. Test performance is not FDA approved in patients less than 23 years old. Performed at [facility], [address]  [place], [place] [address]   C Difficile Quick Screen w PCR reflex     Status: None   Collection Time: 02/10/23  2:26 AM   Specimen: STOOL  Result Value Ref Range Status   C Diff antigen NEGATIVE NEGATIVE Final   C Diff toxin NEGATIVE NEGATIVE Final   C Diff interpretation No C. difficile detected.  Final    Comment: Performed at [facility], [address] [place], [place] [address]    Coagulation Studies: Recent Labs    02/08/2023 1116  LABPROT 15.8*  INR 1.2    Urinalysis: Recent Labs    02/12/2023 1332  COLORURINE YELLOW*  LABSPEC 1.016  PHURINE 5.0  GLUCOSEU NEGATIVE  HGBUR SMALL*  BILIRUBINUR NEGATIVE  KETONESUR NEGATIVE  PROTEINUR NEGATIVE  NITRITE NEGATIVE  LEUKOCYTESUR NEGATIVE      Imaging: ECHOCARDIOGRAM COMPLETE  Result Date: 02/10/2023    ECHOCARDIOGRAM REPORT   Patient Name:   Jay Sandoval Date of Exam: 02/10/2023 Medical Rec #:  [id number]     Height:       69.0 in Accession #:    [id number]    Weight:       395.9 lb Date of Birth:  11-11-1940     BSA:          2.762 m Patient Age:    82 years      BP:           116/60 mmHg Patient Gender: M             HR:           74 bpm.  Exam Location:  [facility] Procedure: 2D Echo, Cardiac Doppler and Color Doppler Indications:     Acute respiratory distress R06.03  History:         Patient has no prior history of Echocardiogram examinations.                  Risk Factors:Hypertension and Sleep Apnea.  Sonographer:     Cristela Blue Referring Phys:  [id number] Erin Fulling Diagnosing Phys: Debbe Odea MD  Sonographer Comments: Echo performed with patient supine and on artificial respirator, Technically challenging study due to limited acoustic windows, no apical window and no subcostal window. IMPRESSIONS  1. Left ventricular ejection fraction, by estimation, is 60 to 65%. The left ventricle has normal function. The left ventricle has no regional wall motion abnormalities. There is mild left ventricular hypertrophy. Left ventricular diastolic function could not be evaluated.  2. Right ventricular systolic function is normal. The right ventricular size is not well visualized.  3. The mitral valve is normal in structure. No evidence of mitral valve regurgitation.  4. The aortic valve is grossly normal. Aortic valve regurgitation is not visualized. FINDINGS  Left Ventricle: Left ventricular ejection fraction, by estimation, is 60 to 65%. The left ventricle has normal function. The left ventricle has no regional wall motion abnormalities. The left ventricular internal cavity size was normal in size. There is  mild left ventricular hypertrophy. Left ventricular diastolic function could not be evaluated. Right Ventricle: The right ventricular size is not well visualized. No increase in right ventricular wall thickness. Right ventricular systolic function is normal. Left Atrium: Left atrial size was normal in size. Right Atrium: Right atrial size was not well visualized. Pericardium: There is no evidence of pericardial effusion. Mitral Valve: The mitral valve is normal in structure. No evidence of mitral valve regurgitation. Tricuspid Valve: The tricuspid valve  is normal in  structure. Tricuspid valve regurgitation is trivial. Aortic Valve: The aortic valve is grossly normal. Aortic valve regurgitation is not visualized. Pulmonic Valve: The pulmonic valve was not well visualized. Pulmonic valve regurgitation is not visualized. Aorta: The aortic root is normal in size and structure. Venous: The inferior vena cava was not well visualized. IAS/Shunts: The interatrial septum was not well visualized.  LEFT VENTRICLE PLAX 2D LVIDd:         4.00 cm LVIDs:         2.20 cm LV PW:         0.90 cm LV IVS:        1.70 cm LVOT diam:     2.00 cm LVOT Area:     3.14 cm  LEFT ATRIUM         Index LA diam:    3.60 cm 1.30 cm/m   AORTA Ao Root diam: 2.90 cm TRICUSPID VALVE TR Peak grad:   11.6 mmHg TR Vmax:        170.00 cm/s  SHUNTS Systemic Diam: 2.00 cm Debbe Odea MD Electronically signed by Debbe Odea MD Signature Date/Time: 02/10/2023/10:38:58 AM    Final    DG Abd 1 View  Result Date: 01/27/2023 CLINICAL DATA:  OG tube placement EXAM: ABDOMEN - 1 VIEW COMPARISON:  CT 12/20/2022 FINDINGS: Esophageal tube tip and side port overlie the stomach. Mild gaseous distension of the stomach. IMPRESSION: Esophageal tube tip and side port overlie the stomach. Electronically Signed   By: Jasmine Pang M.D.   On: 02/08/2023 21:09   US RENAL  Result Date: 02/22/2023 CLINICAL DATA:  Acute kidney injury EXAM: RENAL / URINARY TRACT ULTRASOUND COMPLETE COMPARISON:  None Available. FINDINGS: Right Kidney: Renal measurements: 11.0 x 5.3 x 4.3 cm = volume: 131 mL. Echogenicity within normal limits. No mass or hydronephrosis visualized. Left Kidney: Renal measurements: 10.9 x 6.3 x 5.8 cm = volume: 209 mL. Poorly visualized due to poor acoustic windows. No visible hydronephrosis or mass. Bladder: Appears normal for degree of bladder distention. Other: None. IMPRESSION: No acute findings.  No hydronephrosis. Left kidney poorly visualized due to body habitus and poor acoustic windows.  Electronically Signed   By: Charlett Nose M.D.   On: 02/18/2023 19:50   DG Chest Port 1 View  Result Date: 01/24/2023 CLINICAL DATA:  Central line care EXAM: PORTABLE CHEST 1 VIEW COMPARISON:  02/20/2023 FINDINGS: Patient rotation limits evaluation. An endotracheal tube is present with tip measuring about 8 mm above the carina. A left central venous catheter is present with tip projecting over the brachiocephalic region. No pneumothorax. Cardiac enlargement. Probable left pleural effusion with basilar atelectasis or consolidation. Degenerative changes in the spine and shoulders. IMPRESSION: 1. Patient rotation limits examination. 2. Appliances are unchanged in position as above. 3. Cardiac enlargement. 4. Left pleural effusion with basilar atelectasis or consolidation. Electronically Signed   By: Burman Nieves M.D.   On: 01/25/2023 19:37   DG Chest Port 1 View  Result Date: 01/27/2023 CLINICAL DATA:  Central line placement EXAM: PORTABLE CHEST 1 VIEW COMPARISON:  02/11/2023 FINDINGS: The patient is markedly rotated. Interval intubation, tip of endotracheal tube about 14 mm superior to carina. New left IJ central venous catheter crosses the mediastinum, the tip is directed to the patient's right over the brachiocephalic region. No convincing left pneumothorax. Suspected left pleural effusion and left basilar consolidation. Subsegmental atelectasis at the right base. IMPRESSION: 1. Markedly rotated exam. New left IJ central venous catheter crosses the mediastinum, the  tip is directed to the patient's right over the brachiocephalic region. No convincing left pneumothorax. 2. Endotracheal tube tip about 14 mm superior to carina, carina position slightly difficult to assess given marked degree of patient rotation. 3. Suspected left pleural effusion and left basilar consolidation. Subsegmental atelectasis right base Electronically Signed   By: Jasmine Pang M.D.   On: 02/18/2023 19:06   DG Chest Port 1  View  Result Date: 02/22/2023 CLINICAL DATA:  Questionable sepsis - evaluate for abnormality EXAM: PORTABLE CHEST 1 VIEW COMPARISON:  Chest radiographs dated May 18, 2009. FINDINGS: Patient is rotated to the left with slight obscuration of the left costophrenic angle. Low lung volumes with associated accentuation of the cardiomediastinal silhouette. Aortic calcification. Bibasilar streaky opacities. Left basilar atelectatic changes. No pneumothorax. No acute osseous abnormality. IMPRESSION: Suboptimal examination. Low lung volumes. Bibasilar streaky opacities may represent atelectasis or infiltrate. Electronically Signed   By: Hart Robinsons M.D.   On: 01/24/2023 13:25     Medications:    sodium chloride 10 mL/hr at 02/10/23 1200   fentaNYL infusion INTRAVENOUS 75 mcg/hr (02/10/23 1200)   norepinephrine (LEVOPHED) Adult infusion 16 mcg/min (02/10/23 1200)   piperacillin-tazobactam (ZOSYN)  IV 2.25 g (02/10/23 0523)   vasopressin 0.04 Units/min (02/10/23 1200)    budesonide (PULMICORT) nebulizer solution  0.5 mg Nebulization BID   Chlorhexidine Gluconate Cloth  6 each Topical Daily   docusate  100 mg Per Tube BID   hydrocortisone sod succinate (SOLU-CORTEF) inj  50 mg Intravenous Q6H   ipratropium-albuterol  3 mL Nebulization Q4H   mouth rinse  15 mL Mouth Rinse Q2H   pantoprazole (PROTONIX) IV  40 mg Intravenous Q12H   polyethylene glycol  17 g Per Tube Daily   sodium chloride flush  10 mL Intravenous Q12H   sodium chloride flush  3 mL Intravenous Q12H   sodium zirconium cyclosilicate  10 g Per Tube Q8H   acetaminophen, docusate sodium, midazolam, ondansetron (ZOFRAN) IV, mouth rinse, polyethylene glycol, sodium chloride flush  Assessment/ Plan:  Jay Sandoval is a 82 y.o.  male with medical problems of  asthma, chronic kidney disease, congestive heart failure, COPD, GERD, glaucoma, hemorrhoids, hyperlipidemia, hypertension, obesity, psoriasis, sleep apnea   was admitted on  02/16/2023 for : Upper GI bleeding [K92.2] Acute respiratory failure with hypoxia (HCC) [J96.01] Acute renal failure, unspecified acute renal failure type (HCC) [N17.9]   Acute kidney injury Baseline creatinine of 1.16/GFR greater than 60 on 01/08/2023. Presenting creatinine of 6.97.  BUN 151. Concern for GI bleed in the setting of AKI causing high BUN level. Creatinine stable with little urine output Edema stable. No acute need for dialysis but monitoring closely at this time.   Lab Results  Component Value Date   CREATININE 6.78 (H) 02/10/2023   CREATININE 6.68 (H) 02/17/2023   CREATININE 6.93 (H) 02/05/2023    Intake/Output Summary (Last 24 hours) at 02/10/2023 1203 Last data filed at 02/10/2023 1200 Gross per 24 hour  Intake 3200.42 ml  Output 710 ml  Net 2490.42 ml   2. Severe hyperkalemia Home meds included potassium chloride supplementation, benazepril, meloxicam Agree with holding all of the above. Potassium remains 5.8, treating with shifting measures.    3. Hypotension/sepsis Receiving pressor support with Levo and Vaso Empiric antibiotics vancomycin, cefepime and pip-tazo.    4. Acute respiratory failure Requiring noninvasive positive pressure ventilation Initial ABG with pH of 7.18. Repeat pH of 7.4 at 3 PM.   Management as per ICU team.  LOS: 1 Carleena Mires 10/18/202412:04 PM

## 2023-02-10 NOTE — Consult Note (Deleted)
Consultation Note Date: 02/10/2023   Patient Name: Jay Sandoval  DOB: [birth year]  MRN: [id number]  Age / Sex: 82 y.o., male  PCP: Barbette Reichmann, MD Referring Physician: Erin Fulling, MD  Reason for Consultation: Establishing goals of care   HPI/Brief Hospital Course: 82 y.o. male  with past medical history of morbid obesity, HTN, osteoarthritis and OSA admitted from home on 02/04/2023 with acute hypoxic respiratory failure, cardiogenic shock, hypovolemic shock complicated by severe metabolic encephalopathy and acute renal failure.  Required endotracheal intubation, remains sedated on ventilator   Palliative medicine was consulted for assisting with goals of care conversations.  Subjective:  Extensive chart review has been completed prior to meeting patient including labs, vital signs, imaging, progress notes, orders, and available advanced directive documents from current and previous encounters.  Visited with Jay Sandoval at his bedside he remains intubated and sedated.  Nursing staff provided updates concern for upper GI bleed as NG tube with coffee-ground/black output.  Hemoglobins trended remained stable at this time.  Nursing staff shared they were able to communicate with his primary caregiver and she shared at baseline Jay Sandoval is wheelchair dependent and requires assistance with all ADLs.  She has been his primary caregiver for about 3 years.  Called and spoke to Jay Sandoval niece Para March and time set to have in person goals of care discussion later this afternoon.  Introduced myself as a Publishing rights manager as a member of the palliative care team. Explained palliative medicine is specialized medical care for people living with serious illness. It focuses on providing relief from the symptoms and stress of a serious illness. The goal is to improve quality of life for both the patient and the family.   Met with multiple family members including  niece Para March and niece Omega as well as Jeanette's husband.  Both nieces share Jay Sandoval has siblings that were invited to this goals of care conversation but unfortunately were not able to make it and at this time they are not available by phone.  Met in collaboration with ICU attending Dr. Belia Heman. Dr. Belia Heman provided medical updates including that Jay Sandoval remains on the ventilator with high oxygen requirements, remains on 2 vasopressors to support his blood pressure, renal function remains impaired, and now with concern for GI bleed.  Dr. Belia Heman initiated goals of care discussions.  I then discussed long-term goals such as liberating Jay Sandoval from the ventilator and focusing on comfort versus considering trach/PEG tube placement.  Also explained the need for skilled nursing care within a facility if these interventions were pursued.  Both nieces express they do not feel that Jay Sandoval would want long-term life preserving measures.  At this time we explained to both nieces we are not in a place that these decisions have to be made in this moment.  We discussed the risk of soft tissue breakdown from the endotracheal tube if it remains in place after 10 to 12 days and at that time needing to consider our options.  We also discussed CODE STATUS and the difference between full code versus DO NOT RESUSCITATE.  Both nieces share they have an understanding of the differences and at this time wish for Jay Sandoval to be DO NOT RESUSCITATE.  In collaboration with Dr. Belia Heman we discussed providing Jay Sandoval with time for outcomes continuing all interventions and having ongoing discussions with family, both nieces in agreement with this plan moving forward.  I discussed importance of continued conversations with family/support persons and  all members of their medical team regarding overall plan of care and treatment options ensuring decisions are in alignment with patients goals of care.  All questions/concerns  addressed. Emotional support provided to patient/family/support persons. PMT will continue to follow and support patient as needed.  Objective: Primary Diagnoses: Present on Admission:  Acute respiratory failure with hypoxia (HCC)   Vital Signs: BP 133/66   Pulse 71   Temp 98.4 F (36.9 C)   Resp (!) 23   Ht 5' 9.02" (1.753 m)   Wt (!) 179.6 kg   SpO2 (!) 68%   BMI 58.44 kg/m  Pain Scale: CPOT     IO: Intake/output summary:  Intake/Output Summary (Last 24 hours) at 02/10/2023 1719 Last data filed at 02/10/2023 1634 Gross per 24 hour  Intake 5362.83 ml  Output 1905 ml  Net 3457.83 ml    LBM: Last BM Date :  (PTA) Baseline Weight: Weight: (!) 167.9 kg Most recent weight: Weight: (!) 179.6 kg       Assessment and Plan  SUMMARY OF RECOMMENDATIONS   DNR Time for outcomes PMT to continue to follow for ongoing needs and support  Palliative Prophylaxis:   Bowel Regimen, Delirium Protocol and Frequent Pain Assessment  Discussed With: Primary team and nursing staff   Thank you for this consult and allowing Palliative Medicine to participate in the care of Jay Sandoval. Palliative medicine will continue to follow and assist as needed.   Time Total: 75 minutes  Time spent includes: Detailed review of medical records (labs, imaging, vital signs), medically appropriate exam (mental status, respiratory, cardiac, skin), discussed with treatment team, counseling and educating patient, family and staff, documenting clinical information, medication management and coordination of care.   Signed by: Leeanne Deed, DNP, AGNP-C Palliative Medicine    Please contact Palliative Medicine Team phone at [phone] for questions and concerns.  For individual provider: See Loretha Stapler

## 2023-02-10 NOTE — Plan of Care (Signed)
Patient remains intubated and sedated. Requiring Fentanyl drip at 75 mg currently and has received versed doses x 2 this shift. No changes so far in vent settings but ABG improved since admission. Requiring pressors, norepinephrine and vasopressin. Have weaned norepinephrine from 16 mcg/min to currently 12 mcg/min. Urine output greater than 1 liter so far this shift. Pt has received 2 liters of LR with slight improvement in CVP. NGT to low intermittent suction for back coffee ground drainage. Hgb stable at 12.9. BMP this am slightly improved with K 4.9 down from 5.8. Lokelma continues as ordered. Family in to visit with Palliative NP and Dr. Belia Heman.   Problem: Education: Goal: Knowledge of General Education information will improve Description: Including pain rating scale, medication(s)/side effects and non-pharmacologic comfort measures Outcome: Progressing   Problem: Clinical Measurements: Goal: Ability to maintain clinical measurements within normal limits will improve Outcome: Progressing Goal: Will remain free from infection Outcome: Progressing Goal: Diagnostic test results will improve Outcome: Progressing Goal: Respiratory complications will improve Outcome: Not Progressing Goal: Cardiovascular complication will be avoided Outcome: Progressing   Problem: Activity: Goal: Risk for activity intolerance will decrease Outcome: Not Progressing   Problem: Nutrition: Goal: Adequate nutrition will be maintained Outcome: Not Progressing   Problem: Coping: Goal: Level of anxiety will decrease Outcome: Progressing   Problem: Elimination: Goal: Will not experience complications related to bowel motility Outcome: Progressing Goal: Will not experience complications related to urinary retention Outcome: Progressing   Problem: Pain Managment: Goal: General experience of comfort will improve Outcome: Progressing   Problem: Safety: Goal: Ability to remain free from injury will  improve Outcome: Progressing   Problem: Skin Integrity: Goal: Risk for impaired skin integrity will decrease Outcome: Progressing

## 2023-02-10 NOTE — Consult Note (Signed)
PHARMACY CONSULT NOTE - ELECTROLYTES  Pharmacy Consult for Electrolyte Monitoring and Replacement   Recent Labs: Potassium (mmol/L)  Date Value  02/10/2023 5.8 (H)   Magnesium (mg/dL)  Date Value  78/29/5621 2.5 (H)   Calcium (mg/dL)  Date Value  30/86/5784 8.8 (L)   Albumin (g/dL)  Date Value  69/62/9528 2.5 (L)  09/07/2017 4.5   Phosphorus (mg/dL)  Date Value  41/32/4401 6.4 (H)   Sodium (mmol/L)  Date Value  02/10/2023 141  09/07/2017 143   Height: 5' 9.02" (175.3 cm) Weight: (!) 179.6 kg (395 lb 14.4 oz) IBW/kg (Calculated) : 70.74 Estimated Creatinine Clearance: 14 mL/min (A) (by C-G formula based on SCr of 6.78 mg/dL (H)).  Assessment  Jay Sandoval is a 82 y.o. male presenting with altered mental status and diarrhea x several days. PMH significant for COPD / asthma, CHF, HTN, GERD, OSA. Pharmacy has been consulted to monitor and replace electrolytes.  Diet: NPO MIVF: Bicarb gtt at 125 cc/hr Pertinent medications: Status multiple fluids boluses for sepsis, insulin & dextrose, calcium for hyperkalemia  Goal of Therapy: Electrolytes within normal limits  Plan:  K 5.8 - dextrose + insulin + Lokelma ordered Repeat BMP ordered for 1100 today. All electrolytes with AM labs tomorrow.  Thank you for allowing pharmacy to be a part of this patient's care.  Littie Deeds, PharmD Pharmacy Resident  02/10/2023 7:31 AM

## 2023-02-10 NOTE — Progress Notes (Signed)
Daily Progress Note   Patient Name: JOAN SAHNI       Date: 02/10/2023 DOB: 1940/06/19  Age: 82 y.o. MRN#: [id number] Attending Physician: Erin Fulling, MD Primary Care Physician: Barbette Reichmann, MD Admit Date: 02/06/2023  Reason for Consultation/Follow-up: Establishing goals of care  HPI/Brief Hospital Course: 82 y.o. male  with past medical history of morbid obesity, HTN, osteoarthritis and OSA admitted from home on 02/12/2023 with acute hypoxic respiratory failure, cardiogenic shock, hypovolemic shock complicated by severe metabolic encephalopathy and acute renal failure.  Required endotracheal intubation, remains sedated on ventilator   Palliative medicine was consulted for assisting with goals of care conversations.  Subjective:  Extensive chart review has been completed prior to meeting patient including labs, vital signs, imaging, progress notes, orders, and available advanced directive documents from current and previous encounters.  Visited with Mr. Dentinger at his bedside he remains intubated and sedated.  Nursing staff provided updates concern for upper GI bleed as NG tube with coffee-ground/black output.  Hemoglobins trended remained stable at this time.  Nursing staff shared they were able to communicate with his primary caregiver and she shared at baseline Mr. Yepes is wheelchair dependent and requires assistance with all ADLs.  She has been his primary caregiver for about 3 years.  Called and spoke to Mr. Vierling niece Para March and time set to have in person goals of care discussion later this afternoon.  Introduced myself as a Publishing rights manager as a member of the palliative care team. Explained palliative medicine is specialized medical care for people living with serious  illness. It focuses on providing relief from the symptoms and stress of a serious illness. The goal is to improve quality of life for both the patient and the family.   Met with multiple family members including niece Para March and niece Omega as well as Jeanette's husband.  Both nieces share Mr. Manville has siblings that were invited to this goals of care conversation but unfortunately were not able to make it and at this time they are not available by phone.  Met in collaboration with ICU attending Dr. Belia Heman. Dr. Belia Heman provided medical updates including that Mr. Mullineaux remains on the ventilator with high oxygen requirements, remains on 2 vasopressors to support his blood pressure, renal function remains impaired, and now with concern for GI bleed.  Dr. Belia Heman initiated goals of care discussions.  I then discussed long-term goals such as liberating Mr. Vanvactor from the ventilator and focusing on comfort versus considering trach/PEG tube placement.  Also explained the need for skilled nursing care within a facility if these interventions were pursued.  Both nieces express they do not feel that Mr. Boulton would want long-term life preserving measures.  At this time we explained to both nieces we are not in a place that these decisions have to be made in this moment.  We discussed the risk of soft tissue breakdown from the endotracheal tube if it remains in place after 10 to 12 days and at that time needing to consider our options.  We also discussed CODE STATUS and the difference between full code versus DO NOT RESUSCITATE.  Both nieces share they have an understanding of the differences and at this time wish for Mr. Miramon to be DO NOT RESUSCITATE.  In collaboration with Dr. Belia Heman we discussed providing Mr. Modzelewski with time for outcomes continuing all interventions and having ongoing discussions with family, both nieces in agreement with this plan moving forward.  I discussed importance of continued conversations with  family/support persons and all members of their medical team regarding overall plan of care and treatment options ensuring decisions are in alignment with patients goals of care.  All questions/concerns addressed. Emotional support provided to patient/family/support persons. PMT will continue to follow and support patient as needed.  Objective: Primary Diagnoses: Present on Admission:  Acute respiratory failure with hypoxia (HCC)   Vital Signs: BP 133/66   Pulse 71   Temp 98.4 F (36.9 C)   Resp (!) 23   Ht 5' 9.02" (1.753 m)   Wt (!) 179.6 kg   SpO2 (!) 68%   BMI 58.44 kg/m  Pain Scale: CPOT     IO: Intake/output summary:  Intake/Output Summary (Last 24 hours) at 02/10/2023 1719 Last data filed at 02/10/2023 1634 Gross per 24 hour  Intake 5362.83 ml  Output 1905 ml  Net 3457.83 ml    LBM: Last BM Date :  (PTA) Baseline Weight: Weight: (!) 167.9 kg Most recent weight: Weight: (!) 179.6 kg       Assessment and Plan  SUMMARY OF RECOMMENDATIONS   DNR Time for outcomes PMT to continue to follow for ongoing needs and support  Palliative Prophylaxis:   Bowel Regimen, Delirium Protocol and Frequent Pain Assessment  Discussed With: Primary team and nursing staff   Thank you for this consult and allowing Palliative Medicine to participate in the care of [name]. Palliative medicine will continue to follow and assist as needed.   Total time:  50 minutes  Time spent includes: Detailed review of medical records (labs, imaging, vital signs), medically appropriate exam (mental status, respiratory, cardiac, skin), discussed with treatment team, counseling and educating patient, family and staff, documenting clinical information, medication management and coordination of care.  Leeanne Deed, DNP, AGNP-C Palliative Medicine   Please contact Palliative Medicine Team phone at [phone] for questions and concerns.

## 2023-02-10 NOTE — Progress Notes (Signed)
NAME:  Jay Sandoval, MRN:  [id number], DOB:  15-Mar-1941, LOS: 1 ADMISSION DATE:  02-26-23 CONSULTATION DATE:  01/30/23  CHIEF COMPLAINT:  AMS and 2 days of diarrhea  BRIEF SYNOPSIS Patient presents in acute respiratory distress   History of Present Illness:  82 year old male brought to the ED for altered mental status with complaint of diarrhea x 2 days. The patient was discharged 2 weeks ago for pneumonia and went home on PO medication. The patient is currently bed bound and family member reports that the patient is minimally responsive. The patient was brought in my EMS who report that O2 sat was 80% on room air.  The patient was put on BiPAP in the ED and consulted ICU team Patient seen on biPAP, severe resp distress, severe encephalopathy  ER Course CODE SEPSIS CALLED Give IVF's Give ABX Placed on biPAP Placed a foley Obtained Cx's ARF-Bun 150 K=7.4 WBC 15 LA 3.4 VBG 7.18/69    Pertinent  Medical History   Past Medical History:  Diagnosis Date   Arthritis    Asthma    Hypertension    Osteoporosis    Sleep apnea      Significant Hospital Events: Including procedures, antibiotic start and stop dates in addition to other pertinent events   February 26, 2023 Patient presents to the ED with acute respiratory distress 10/18 severe multiorgan failure    Antimicrobials:  Patient indicated for empiric abx as per sepsis protocol  Antibiotics Given (last 72 hours)     Date/Time Action Medication Dose Rate   2023/02/26 1134 New Bag/Given   ceFEPIme (MAXIPIME) 2 g in sodium chloride 0.9 % 100 mL IVPB 2 g 200 mL/hr   Feb 26, 2023 1214 New Bag/Given   vancomycin (VANCOCIN) IVPB 1000 mg/200 mL premix 1,000 mg 200 mL/hr   2023-02-26 2143 New Bag/Given   piperacillin-tazobactam (ZOSYN) IVPB 2.25 g 2.25 g 100 mL/hr   02/10/23 0523 New Bag/Given   piperacillin-tazobactam (ZOSYN) IVPB 2.25 g 2.25 g 100 mL/hr        Interim History / Subjective:  Remains critically ill Severe  hypoxia Severe renal failure Multiorgan failure Low chance of meaningful recovery    Objective   Blood pressure 116/60, pulse 74, temperature 98.2 F (36.8 C), resp. rate 16, height 5' 9.02" (1.753 m), weight (!) 179.6 kg, SpO2 93%.    Vent Mode: PRVC FiO2 (%):  [40 %-100 %] 70 % Set Rate:  [16 bmp-20 bmp] 16 bmp Vt Set:  [550 mL] 550 mL PEEP:  [5 cmH20-8 cmH20] 8 cmH20 Plateau Pressure:  [28 cmH20] 28 cmH20   Intake/Output Summary (Last 24 hours) at 02/10/2023 0718 Last data filed at 02/10/2023 0650 Gross per 24 hour  Intake 2971.6 ml  Output 280 ml  Net 2691.6 ml   Filed Weights   Feb 26, 2023 1956 26-Feb-2023 2200 02/10/23 0500  Weight: (!) 167.9 kg (!) 182 kg (!) 179.6 kg     REVIEW OF SYSTEMS  PATIENT IS UNABLE TO PROVIDE COMPLETE REVIEW OF SYSTEMS DUE TO SEVERE CRITICAL ILLNESS   PHYSICAL EXAMINATION:  GENERAL:critically ill appearing, +resp distress EYES: Pupils equal, round, reactive to light.  No scleral icterus.  MOUTH: Moist mucosal membrane. INTUBATED NECK: Supple.  PULMONARY: Lungs clear to auscultation, +rhonchi, +wheezing CARDIOVASCULAR: S1 and S2.  Regular rate and rhythm GASTROINTESTINAL: Soft, nontender, -distended. Positive bowel sounds.  MUSCULOSKELETAL:  edema.  NEUROLOGIC: obtunded,sedated SKIN:normal, warm to touch, Capillary refill delayed  Pulses present bilaterally    ASSESSMENT AND PLAN SYNOPSIS  82 yo morbidly obese AAM admitted for acute hypoxic resp failure due to probable underlying pneumonia with probable acute cardiac failure with shock likely due to combination of sepsis/cardiogenic/Hypovolumic  complicated by severe metabolic acidosis and acute metabolic encephalopathy with  acute renal failure  Severe ACUTE Hypoxic and Hypercapnic Respiratory Failure -continue Mechanical Ventilator support -Wean Fio2 and PEEP as tolerated -VAP/VENT bundle implementation - Wean PEEP & FiO2 as tolerated, maintain SpO2 > 88% - Head of bed elevated  30 degrees, VAP protocol in place - Plateau pressures less than 30 cm H20  - Intermittent chest x-ray & ABG PRN - Ensure adequate pulmonary hygiene   Vent Mode: PRVC FiO2 (%):  [40 %-100 %] 70 % Set Rate:  [16 bmp-20 bmp] 16 bmp Vt Set:  [550 mL] 550 mL PEEP:  [5 cmH20-8 cmH20] 8 cmH20 Plateau Pressure:  [28 cmH20] 28 cmH20   CARDIAC FAILURE-check ECHO -oxygen as needed -Lasix as tolerated -follow up cardiac enzymes as indicated   CARDIAC ICU monitoring  ACUTE KIDNEY INJURY/Renal Failure -continue Foley Catheter-assess need -Avoid nephrotoxic agents -Follow urine output, BMP -Ensure adequate renal perfusion, optimize oxygenation -Renal dose medications Overall prognosis is poor   Intake/Output Summary (Last 24 hours) at 02/10/2023 0720 Last data filed at 02/10/2023 5409 Gross per 24 hour  Intake 2971.6 ml  Output 280 ml  Net 2691.6 ml    NEUROLOGY ACUTE METABOLIC ENCEPHALOPATHY -need for sedation -Goal RASS -1   SHOCK  -use vasopressors to keep MAP>65 as needed -follow ABG and LA -follow up cultures -emperic ABX   ENDO - ICU hypoglycemic\Hyperglycemia protocol -check FSBS per protocol   GI GI PROPHYLAXIS as indicated  NUTRITIONAL STATUS DIET-->TF's as tolerated Constipation protocol as indicated   ELECTROLYTES -follow labs as needed -replace as needed -pharmacy consultation and following  ACUTE ANEMIA- TRANSFUSE AS NEEDED CONSIDER TRANSFUSION  IF HGB<7      Best practice (right click and "Reselect all SmartList Selections" daily)  Diet: NPO  DVT prophylaxis: Subcutaneous Heparin GI prophylaxis: H2B Glucose control:  SSI Yes Foley:  Yes, and it is still needed Mobility:  bed rest  Code Status:  FULL Disposition:ICU  Labs   CBC: Recent Labs  Lab 02/21/2023 1116 01/31/2023 1452 02/20/2023 2312 02/10/23 0438  WBC 15.2* 8.4 4.3 5.1  NEUTROABS 8.2*  --   --   --   HGB 12.8* 10.7* 13.5 12.9*  HCT 41.1 34.6* 41.0 38.3*  MCV 97.9  98.6 93.8 90.8  PLT 204 148* 168 175    Basic Metabolic Panel: Recent Labs  Lab 01/27/2023 1215 02/20/2023 1452 01/31/2023 1829 02/10/23 0438  NA 139  --  140 141  K 7.4* 5.3* 5.6* 5.8*  CL 99  --  100 99  CO2 24  --  22 25  GLUCOSE 125*  --  147* 142*  BUN 151*  --  145* 144*  CREATININE 6.97* 6.93* 6.68* 6.78*  CALCIUM 9.1  --  9.1 8.8*  MG  --   --  2.8* 2.5*  PHOS  --   --  6.0* 6.4*   GFR: Estimated Creatinine Clearance: 14 mL/min (A) (by C-G formula based on SCr of 6.78 mg/dL (H)). Recent Labs  Lab 02/21/2023 1116 02/08/2023 1332 02/12/2023 1452 02/19/2023 2147 01/31/2023 2312 02/10/23 0438  WBC 15.2*  --  8.4  --  4.3 5.1  LATICACIDVEN 3.6* 3.4*  --  3.2* 2.9* 2.9*    Liver Function Tests: Recent Labs  Lab 02/20/2023 1215 02/10/23 0438  AST  148* 541*  ALT 67* 314*  ALKPHOS 46 50  BILITOT 0.5 1.2  PROT 6.7 6.1*  ALBUMIN 2.8* 2.5*   Recent Labs  Lab 01/30/2023 1215  LIPASE 31   No results for input(s): "AMMONIA" in the last 168 hours.  ABG    Component Value Date/Time   PHART 7.48 (H) 02/04/2023 1712   PCO2ART 25 (L) 02/03/2023 1712   PO2ART 85 02/10/2023 1712   HCO3 18.6 (L) 02/21/2023 1712   ACIDBASEDEF 3.3 (H) 02/05/2023 1712   O2SAT 99.6 02/04/2023 1712     Coagulation Profile: Recent Labs  Lab 02/23/2023 1116  INR 1.2   Home Medications  Prior to Admission medications   Medication Sig Start Date End Date Taking? Authorizing Provider  albuterol (PROVENTIL) (2.5 MG/3ML) 0.083% nebulizer solution Take 2.5 mg by nebulization every 6 (six) hours as needed. 03/04/16   [provider]  albuterol (VENTOLIN HFA) 108 (90 Base) MCG/ACT inhaler Inhale 2 puffs into the lungs every 6 (six) hours as needed for wheezing or shortness of breath.    [provider]  allopurinol (ZYLOPRIM) 100 MG tablet Take 200 mg by mouth daily. 05/27/14   [provider]  aspirin EC 81 MG tablet Take 81 mg by mouth daily. Swallow whole.    [provider]  atenolol (TENORMIN) 50 MG tablet Take 50 mg by mouth daily. 02/15/16   [provider]  atorvastatin (LIPITOR) 10 MG tablet Take 1 tablet (10 mg total) by mouth daily. 01/11/23   Leeroy Bock, MD  benazepril (LOTENSIN) 40 MG tablet Take 40 mg by mouth daily.    [provider]  Coenzyme Q10 100 MG capsule Take 100 mg by mouth 2 (two) times daily.    [provider]  DULoxetine (CYMBALTA) 60 MG capsule Take 60 mg by mouth daily.    [provider]  fluticasone (FLONASE) 50 MCG/ACT nasal spray Place 2 sprays into both nostrils daily as needed.    [provider]  levothyroxine (SYNTHROID) 25 MCG tablet Take 25 mcg by mouth daily before breakfast. 10/13/18   [provider]  meloxicam (MOBIC) 7.5 MG tablet Take 1 tablet (7.5 mg total) by mouth daily as needed for pain. 01/10/23   Leeroy Bock, MD  metolazone (ZAROXOLYN) 2.5 MG tablet Take 2.5 mg by mouth every 7 (seven) days.    [provider]  Multiple Vitamins-Minerals (MULTIVITAL PO) Take 1 tablet by mouth daily.    [provider]  omega-3 acid ethyl esters (LOVAZA) 1 g capsule Take 2 capsules by mouth 2 (two) times daily.    [provider]  potassium chloride SA (K-DUR) 20 MEQ tablet Take 20 mEq by mouth daily. 09/06/18   [provider]  pregabalin (LYRICA) 100 MG capsule Take 1 capsule (100 mg total) by mouth 2 (two) times daily. 01/10/23 02/01/2023  Leeroy Bock, MD  RESTASIS 0.05 % ophthalmic emulsion Place 1 drop into both eyes 2 (two) times daily. 02/25/16   [provider]  SYMBICORT 160-4.5 MCG/ACT inhaler Inhale 2 puffs into the lungs 2 (two) times daily.    [provider]  torsemide 40 MG TABS Take 40 mg by mouth daily. 01/10/23   Leeroy Bock, MD  WEGOVY 0.5 MG/0.5ML SOAJ Inject 0.5 mg into the skin once a week. Saturday    [provider]        PATIENT WITH VERY POOR  PROGNOSIS I ANTICIPATE PROLONGED ICU LOS  DVT/GI PRX  assessed I Assessed the need for Labs I Assessed the need for Foley I Assessed the need for Central Venous Line Family Discussion when available I Assessed the need for Mobilization I made an Assessment of medications to be adjusted accordingly Safety Risk assessment completed  CASE DISCUSSED IN MULTIDISCIPLINARY ROUNDS WITH ICU TEAM     Critical Care Time devoted to patient care services described in this note is 65 minutes.  Critical care was necessary to treat /prevent imminent and life-threatening deterioration. Overall, patient is critically ill, prognosis is guarded.  Patient with Multiorgan failure and at high risk for cardiac arrest and death.    Lucie Leather, M.D.  [facility]  Medical Director [facility] Medical Director [facility] Cardio-Pulmonary Department

## 2023-02-11 ENCOUNTER — Other Ambulatory Visit: Payer: Self-pay

## 2023-02-11 DIAGNOSIS — N179 Acute kidney failure, unspecified: Secondary | ICD-10-CM | POA: Diagnosis not present

## 2023-02-11 DIAGNOSIS — Z515 Encounter for palliative care: Secondary | ICD-10-CM | POA: Diagnosis not present

## 2023-02-11 DIAGNOSIS — K922 Gastrointestinal hemorrhage, unspecified: Secondary | ICD-10-CM

## 2023-02-11 DIAGNOSIS — J9601 Acute respiratory failure with hypoxia: Secondary | ICD-10-CM | POA: Diagnosis not present

## 2023-02-11 LAB — CBC WITH DIFFERENTIAL/PLATELET
Abs Immature Granulocytes: 0.03 10*3/uL (ref 0.00–0.07)
Basophils Absolute: 0 10*3/uL (ref 0.0–0.1)
Basophils Relative: 0 %
Eosinophils Absolute: 0 10*3/uL (ref 0.0–0.5)
Eosinophils Relative: 0 %
HCT: 35.4 % — ABNORMAL LOW (ref 39.0–52.0)
Hemoglobin: 11.8 g/dL — ABNORMAL LOW (ref 13.0–17.0)
Immature Granulocytes: 0 %
Lymphocytes Relative: 6 %
Lymphs Abs: 0.9 10*3/uL (ref 0.7–4.0)
MCH: 30.2 pg (ref 26.0–34.0)
MCHC: 33.3 g/dL (ref 30.0–36.0)
MCV: 90.5 fL (ref 80.0–100.0)
Monocytes Absolute: 0.9 10*3/uL (ref 0.1–1.0)
Monocytes Relative: 7 %
Neutro Abs: 12.2 10*3/uL — ABNORMAL HIGH (ref 1.7–7.7)
Neutrophils Relative %: 87 %
Platelets: 170 10*3/uL (ref 150–400)
RBC: 3.91 MIL/uL — ABNORMAL LOW (ref 4.22–5.81)
RDW: 15.6 % — ABNORMAL HIGH (ref 11.5–15.5)
Smear Review: NORMAL
WBC: 14.3 10*3/uL — ABNORMAL HIGH (ref 4.0–10.5)
nRBC: 1.5 % — ABNORMAL HIGH (ref 0.0–0.2)

## 2023-02-11 LAB — GLUCOSE, CAPILLARY
Glucose-Capillary: 178 mg/dL — ABNORMAL HIGH (ref 70–99)
Glucose-Capillary: 176 mg/dL — ABNORMAL HIGH (ref 70–99)
Glucose-Capillary: 151 mg/dL — ABNORMAL HIGH (ref 70–99)
Glucose-Capillary: 166 mg/dL — ABNORMAL HIGH (ref 70–99)
Glucose-Capillary: 172 mg/dL — ABNORMAL HIGH (ref 70–99)

## 2023-02-11 LAB — BASIC METABOLIC PANEL
Anion gap: 20 — ABNORMAL HIGH (ref 5–15)
BUN: 142 mg/dL — ABNORMAL HIGH (ref 8–23)
CO2: 23 mmol/L (ref 22–32)
Calcium: 8.4 mg/dL — ABNORMAL LOW (ref 8.9–10.3)
Chloride: 102 mmol/L (ref 98–111)
Creatinine, Ser: 5.83 mg/dL — ABNORMAL HIGH (ref 0.61–1.24)
GFR, Estimated: 9 mL/min — ABNORMAL LOW (ref >60)
Glucose, Bld: 188 mg/dL — ABNORMAL HIGH (ref 70–99)
Potassium: 4 mmol/L (ref 3.5–5.1)
Sodium: 145 mmol/L (ref 135–145)

## 2023-02-11 LAB — MAGNESIUM: Magnesium: 2.3 mg/dL (ref 1.7–2.4)

## 2023-02-11 LAB — PHOSPHORUS: Phosphorus: 6.6 mg/dL — ABNORMAL HIGH (ref 2.5–4.6)

## 2023-02-11 MED ORDER — ALTEPLASE 2 MG IJ SOLR
2.0000 mg | Freq: Once | INTRAMUSCULAR | Status: AC
  Administered 2023-02-11: 2 mg

## 2023-02-11 MED ORDER — ALTEPLASE 2 MG IJ SOLR
2.0000 mg | Freq: Once | INTRAMUSCULAR | Status: AC
  Administered 2023-02-11: 2 mg
  Filled 2023-02-11: qty 2

## 2023-02-11 MED ORDER — SODIUM CHLORIDE 0.9% FLUSH
10.0000 mL | Freq: Two times a day (BID) | INTRAVENOUS | Status: DC
  Administered 2023-02-11: 10 mL
  Administered 2023-02-11: 30 mL
  Administered 2023-02-12 – 2023-02-14 (×5): 10 mL

## 2023-02-11 NOTE — Plan of Care (Signed)
Discussed with in front of patient plan of care for the evening, pain management and going over bedtime medications with no evidence of learning. What is important is rest tonight.   Problem: Education: Goal: Knowledge of General Education information will improve Description: Including pain rating scale, medication(s)/side effects and non-pharmacologic comfort measures Outcome: Not Progressing   Problem: Health Behavior/Discharge Planning: Goal: Ability to manage health-related needs will improve Outcome: Not Progressing

## 2023-02-11 NOTE — Progress Notes (Signed)
[facility]  PROGRESS NOTE   Subjective:   Patient is vented and sedated. Still hypotensive on Levophed and vasopressin. Urine output is acceptable.  Objective:  Vital signs: Blood pressure 136/65, pulse 98, temperature 98.6 F (37 C), resp. rate 20, height 5' 9.02" (1.753 m), weight (!) 181.4 kg, SpO2 92%.  Intake/Output Summary (Last 24 hours) at 02/11/2023 1505 Last data filed at 02/11/2023 1447 Gross per 24 hour  Intake 3214.11 ml  Output 3440 ml  Net -225.89 ml   Filed Weights   02/04/2023 2200 02/10/23 0500 02/11/23 0500  Weight: (!) 182 kg (!) 179.6 kg (!) 181.4 kg     Physical Exam: General:  No acute distress  Head:  Normocephalic, atraumatic. Moist oral mucosal membranes  Eyes:  Anicteric  Neck:  Supple  Lungs:   Clear to auscultation, normal effort  Heart:  S1S2 no rubs  Abdomen:   Soft, nontender, bowel sounds present  Extremities:  peripheral edema.  Neurologic: Vented and sedated.  Skin:  No lesions  Access:     Basic Metabolic Panel: Recent Labs  Lab 02/22/2023 1215 02/04/2023 1452 02/11/2023 1829 02/10/23 0438 02/10/23 1158 02/11/23 0530  NA 139  --  140 141 142 145  K 7.4* 5.3* 5.6* 5.8* 4.9 4.0  CL 99  --  100 99 101 102  CO2 24  --  22 25 25 23   GLUCOSE 125*  --  147* 142* 152* 188*  BUN 151*  --  145* 144* 138* 142*  CREATININE 6.97* 6.93* 6.68* 6.78* 6.75* 5.83*  CALCIUM 9.1  --  9.1 8.8* 8.6* 8.4*  MG  --   --  2.8* 2.5*  --  2.3  PHOS  --   --  6.0* 6.4*  --  6.6*   GFR: Estimated Creatinine Clearance: 16.4 mL/min (A) (by C-G formula based on SCr of 5.83 mg/dL (H)).  Liver Function Tests: Recent Labs  Lab 02/05/2023 1215 02/10/23 0438  AST 148* 541*  ALT 67* 314*  ALKPHOS 46 50  BILITOT 0.5 1.2  PROT 6.7 6.1*  ALBUMIN 2.8* 2.5*   Recent Labs  Lab 02/16/2023 1215  LIPASE 31   No results for input(s): "AMMONIA" in the last 168 hours.  CBC: Recent Labs  Lab 02/07/2023 1116 02/16/2023 1452 02/16/2023 2312  02/10/23 0438 02/10/23 1159 02/11/23 0953  WBC 15.2* 8.4 4.3 5.1 6.9 14.3*  NEUTROABS 8.2*  --   --   --  5.5 12.2*  HGB 12.8* 10.7* 13.5 12.9* 12.6* 11.8*  HCT 41.1 34.6* 41.0 38.3* 38.9* 35.4*  MCV 97.9 98.6 93.8 90.8 93.1 90.5  PLT 204 148* 168 175 173 170     HbA1C: No results found for: "HGBA1C"  Urinalysis: Recent Labs    02/05/2023 1332  COLORURINE YELLOW*  LABSPEC 1.016  PHURINE 5.0  GLUCOSEU NEGATIVE  HGBUR SMALL*  BILIRUBINUR NEGATIVE  KETONESUR NEGATIVE  PROTEINUR NEGATIVE  NITRITE NEGATIVE  LEUKOCYTESUR NEGATIVE      Imaging: ECHOCARDIOGRAM COMPLETE  Result Date: 02/10/2023    ECHOCARDIOGRAM REPORT   Patient Name:   Jay Sandoval Date of Exam: 02/10/2023 Medical Rec #:  [id number]     Height:       69.0 in Accession #:    [id number]    Weight:       395.9 lb Date of Birth:  1940-10-13     BSA:          2.762 m Patient Age:    82 years  BP:           116/60 mmHg Patient Gender: M             HR:           74 bpm. Exam Location:  [facility] Procedure: 2D Echo, Cardiac Doppler and Color Doppler Indications:     Acute respiratory distress R06.03  History:         Patient has no prior history of Echocardiogram examinations.                  Risk Factors:Hypertension and Sleep Apnea.  Sonographer:     Cristela Blue Referring Phys:  [id number] Erin Fulling Diagnosing Phys: Debbe Odea MD  Sonographer Comments: Echo performed with patient supine and on artificial respirator, Technically challenging study due to limited acoustic windows, no apical window and no subcostal window. IMPRESSIONS  1. Left ventricular ejection fraction, by estimation, is 60 to 65%. The left ventricle has normal function. The left ventricle has no regional wall motion abnormalities. There is mild left ventricular hypertrophy. Left ventricular diastolic function could not be evaluated.  2. Right ventricular systolic function is normal. The right ventricular size is not well visualized.  3. The mitral valve is  normal in structure. No evidence of mitral valve regurgitation.  4. The aortic valve is grossly normal. Aortic valve regurgitation is not visualized. FINDINGS  Left Ventricle: Left ventricular ejection fraction, by estimation, is 60 to 65%. The left ventricle has normal function. The left ventricle has no regional wall motion abnormalities. The left ventricular internal cavity size was normal in size. There is  mild left ventricular hypertrophy. Left ventricular diastolic function could not be evaluated. Right Ventricle: The right ventricular size is not well visualized. No increase in right ventricular wall thickness. Right ventricular systolic function is normal. Left Atrium: Left atrial size was normal in size. Right Atrium: Right atrial size was not well visualized. Pericardium: There is no evidence of pericardial effusion. Mitral Valve: The mitral valve is normal in structure. No evidence of mitral valve regurgitation. Tricuspid Valve: The tricuspid valve is normal in structure. Tricuspid valve regurgitation is trivial. Aortic Valve: The aortic valve is grossly normal. Aortic valve regurgitation is not visualized. Pulmonic Valve: The pulmonic valve was not well visualized. Pulmonic valve regurgitation is not visualized. Aorta: The aortic root is normal in size and structure. Venous: The inferior vena cava was not well visualized. IAS/Shunts: The interatrial septum was not well visualized.  LEFT VENTRICLE PLAX 2D LVIDd:         4.00 cm LVIDs:         2.20 cm LV PW:         0.90 cm LV IVS:        1.70 cm LVOT diam:     2.00 cm LVOT Area:     3.14 cm  LEFT ATRIUM         Index LA diam:    3.60 cm 1.30 cm/m   AORTA Ao Root diam: 2.90 cm TRICUSPID VALVE TR Peak grad:   11.6 mmHg TR Vmax:        170.00 cm/s  SHUNTS Systemic Diam: 2.00 cm Debbe Odea MD Electronically signed by Debbe Odea MD Signature Date/Time: 02/10/2023/10:38:58 AM    Final    DG Abd 1 View  Result Date: 02/19/2023 CLINICAL  DATA:  OG tube placement EXAM: ABDOMEN - 1 VIEW COMPARISON:  CT 12/20/2022 FINDINGS: Esophageal tube tip and side port overlie the stomach. Mild  gaseous distension of the stomach. IMPRESSION: Esophageal tube tip and side port overlie the stomach. Electronically Signed   By: Jasmine Pang M.D.   On: 02/12/2023 21:09   US RENAL  Result Date: 02/01/2023 CLINICAL DATA:  Acute kidney injury EXAM: RENAL / URINARY TRACT ULTRASOUND COMPLETE COMPARISON:  None Available. FINDINGS: Right Kidney: Renal measurements: 11.0 x 5.3 x 4.3 cm = volume: 131 mL. Echogenicity within normal limits. No mass or hydronephrosis visualized. Left Kidney: Renal measurements: 10.9 x 6.3 x 5.8 cm = volume: 209 mL. Poorly visualized due to poor acoustic windows. No visible hydronephrosis or mass. Bladder: Appears normal for degree of bladder distention. Other: None. IMPRESSION: No acute findings.  No hydronephrosis. Left kidney poorly visualized due to body habitus and poor acoustic windows. Electronically Signed   By: Charlett Nose M.D.   On: 02/08/2023 19:50   DG Chest Port 1 View  Result Date: 02/05/2023 CLINICAL DATA:  Central line care EXAM: PORTABLE CHEST 1 VIEW COMPARISON:  02/05/2023 FINDINGS: Patient rotation limits evaluation. An endotracheal tube is present with tip measuring about 8 mm above the carina. A left central venous catheter is present with tip projecting over the brachiocephalic region. No pneumothorax. Cardiac enlargement. Probable left pleural effusion with basilar atelectasis or consolidation. Degenerative changes in the spine and shoulders. IMPRESSION: 1. Patient rotation limits examination. 2. Appliances are unchanged in position as above. 3. Cardiac enlargement. 4. Left pleural effusion with basilar atelectasis or consolidation. Electronically Signed   By: Burman Nieves M.D.   On: 02/01/2023 19:37   DG Chest Port 1 View  Result Date: 02/10/2023 CLINICAL DATA:  Central line placement EXAM: PORTABLE CHEST  1 VIEW COMPARISON:  02/05/2023 FINDINGS: The patient is markedly rotated. Interval intubation, tip of endotracheal tube about 14 mm superior to carina. New left IJ central venous catheter crosses the mediastinum, the tip is directed to the patient's right over the brachiocephalic region. No convincing left pneumothorax. Suspected left pleural effusion and left basilar consolidation. Subsegmental atelectasis at the right base. IMPRESSION: 1. Markedly rotated exam. New left IJ central venous catheter crosses the mediastinum, the tip is directed to the patient's right over the brachiocephalic region. No convincing left pneumothorax. 2. Endotracheal tube tip about 14 mm superior to carina, carina position slightly difficult to assess given marked degree of patient rotation. 3. Suspected left pleural effusion and left basilar consolidation. Subsegmental atelectasis right base Electronically Signed   By: Jasmine Pang M.D.   On: 02/16/2023 19:06     Medications:    fentaNYL infusion INTRAVENOUS 150 mcg/hr (02/11/23 1400)   norepinephrine (LEVOPHED) Adult infusion 12 mcg/min (02/11/23 1400)   piperacillin-tazobactam (ZOSYN)  IV 2.25 g (02/11/23 1259)   vasopressin 0.04 Units/min (02/11/23 1400)    budesonide (PULMICORT) nebulizer solution  0.5 mg Nebulization BID   Chlorhexidine Gluconate Cloth  6 each Topical Daily   docusate  100 mg Per Tube BID   hydrocortisone sod succinate (SOLU-CORTEF) inj  50 mg Intravenous Q6H   ipratropium-albuterol  3 mL Nebulization Q4H   mouth rinse  15 mL Mouth Rinse Q2H   pantoprazole (PROTONIX) IV  40 mg Intravenous Q12H   polyethylene glycol  17 g Per Tube Daily   sodium chloride flush  10 mL Intravenous Q12H   sodium chloride flush  10-40 mL Intracatheter Q12H   sodium chloride flush  3 mL Intravenous Q12H    Assessment/ Plan:     Jay Sandoval is a 82 y.o.  male with  medical problems of  asthma, chronic kidney disease, congestive heart failure, COPD, GERD,  glaucoma, hemorrhoids, hyperlipidemia, hypertension, obesity, psoriasis, sleep apnea   was admitted on 02-28-23 for : Upper GI bleeding [K92.2] Acute respiratory failure with hypoxia (HCC) [J96.01] Acute renal failure, unspecified acute renal failure type (HCC) [N17.9]  #1: Acute kidney injury: Acute kidney injury most likely secondary to acute tubular necrosis due to hemodynamic compromise.  Urine output has slowly improved.  Will continue to monitor closely.  Patient is a poor candidate for renal replacement therapy.  #2: Hyperkalemia: Potassium is now improved.  #3: Hypotension/sepsis: Continue pressors and wean as tolerated.  Continue Zosyn as ordered.  ICU attending Dr. Belia Heman note appreciated. Patient is now DNR and DNI. Labs and medications reviewed. Will continue to follow along with you.   LOS: 2 Lorain Childes, MD [facility] 10/19/20243:05 PM

## 2023-02-11 NOTE — Progress Notes (Signed)
NAME:  Jay Sandoval, MRN:  [id number], DOB:  1940-08-28, LOS: 2 ADMISSION DATE:  02/03/2023 CONSULTATION DATE:  01/30/23  CHIEF COMPLAINT:  AMS and 2 days of diarrhea  BRIEF SYNOPSIS Patient presents in acute respiratory distress   History of Present Illness:  82 year old male brought to the ED for altered mental status with complaint of diarrhea x 2 days. The patient was discharged 2 weeks ago for pneumonia and went home on PO medication. The patient is currently bed bound and family member reports that the patient is minimally responsive. The patient was brought in my EMS who report that O2 sat was 80% on room air.  The patient was put on BiPAP in the ED and consulted ICU team Patient seen on biPAP, severe resp distress, severe encephalopathy  ER Course CODE SEPSIS CALLED Give IVF's Give ABX Placed on biPAP Placed a foley Obtained Cx's ARF-Bun 150 K=7.4 WBC 15 LA 3.4 VBG 7.18/69    Pertinent  Medical History   Past Medical History:  Diagnosis Date   Arthritis    Asthma    Hypertension    Osteoporosis    Sleep apnea      Significant Hospital Events: Including procedures, antibiotic start and stop dates in addition to other pertinent events   10/17 Patient presents to the ED with acute respiratory distress 10/18 severe multiorgan failure 10/19 remains on pressors, remains critically ill    Antimicrobials:  Patient indicated for empiric abx as per sepsis protocol  Antibiotics Given (last 72 hours)     Date/Time Action Medication Dose Rate   02/18/2023 1134 New Bag/Given   ceFEPIme (MAXIPIME) 2 g in sodium chloride 0.9 % 100 mL IVPB 2 g 200 mL/hr   02/22/2023 1214 New Bag/Given   vancomycin (VANCOCIN) IVPB 1000 mg/200 mL premix 1,000 mg 200 mL/hr   01/25/2023 2143 New Bag/Given   piperacillin-tazobactam (ZOSYN) IVPB 2.25 g 2.25 g 100 mL/hr   02/10/23 0523 New Bag/Given   piperacillin-tazobactam (ZOSYN) IVPB 2.25 g 2.25 g 100 mL/hr   02/10/23 1558 New  Bag/Given  [med not available at previously scheduled time]   piperacillin-tazobactam (ZOSYN) IVPB 2.25 g 2.25 g 100 mL/hr   02/10/23 2154 New Bag/Given   piperacillin-tazobactam (ZOSYN) IVPB 2.25 g 2.25 g 100 mL/hr   02/11/23 6213 New Bag/Given   piperacillin-tazobactam (ZOSYN) IVPB 2.25 g 2.25 g 100 mL/hr   02/11/23 1259 New Bag/Given   piperacillin-tazobactam (ZOSYN) IVPB 2.25 g 2.25 g 100 mL/hr        Interim History / Subjective: Remains critically ill Severe hypoxia Renal failure   multiorgan failure     Objective   Blood pressure 98/62, pulse 94, temperature 98.6 F (37 C), resp. rate 15, height 5' 9.02" (1.753 m), weight (!) 181.4 kg, SpO2 95%. CVP:  [1 mmHg-17 mmHg] 2 mmHg  Vent Mode: PRVC FiO2 (%):  [45 %-60 %] 45 % Set Rate:  [16 bmp] 16 bmp Vt Set:  [550 mL] 550 mL PEEP:  [8 cmH20] 8 cmH20 Plateau Pressure:  [18 cmH20-20 cmH20] 20 cmH20   Intake/Output Summary (Last 24 hours) at 02/11/2023 1644 Last data filed at 02/11/2023 1617 Gross per 24 hour  Intake 1797.44 ml  Output 3205 ml  Net -1407.56 ml   Filed Weights   02/11/2023 2200 02/10/23 0500 02/11/23 0500  Weight: (!) 182 kg (!) 179.6 kg (!) 181.4 kg     REVIEW OF SYSTEMS  PATIENT IS UNABLE TO PROVIDE COMPLETE REVIEW OF SYSTEMS DUE  TO SEVERE CRITICAL ILLNESS   PHYSICAL EXAMINATION:  GENERAL:critically ill appearing, +resp distress EYES: Pupils equal, round, reactive to light.  No scleral icterus.  MOUTH: Moist mucosal membrane. INTUBATED NECK: Supple.  PULMONARY: Lungs clear to auscultation, +rhonchi, +wheezing CARDIOVASCULAR: S1 and S2.  Regular rate and rhythm GASTROINTESTINAL: Soft, nontender, -distended. Positive bowel sounds.  MUSCULOSKELETAL: No swelling, clubbing, or edema.  NEUROLOGIC: obtunded,sedated SKIN:normal, warm to touch, Capillary refill delayed  Pulses present bilaterally   ASSESSMENT AND PLAN SYNOPSIS  82 yo morbidly obese AAM admitted for acute hypoxic resp failure  due to probable underlying pneumonia with probable acute cardiac failure with shock likely due to combination of sepsis/cardiogenic/Hypovolumic  complicated by severe metabolic acidosis and acute metabolic encephalopathy with  acute renal failure  Severe ACUTE Hypoxic and Hypercapnic Respiratory Failure -continue Mechanical Ventilator support -Wean Fio2 and PEEP as tolerated -VAP/VENT bundle implementation - Wean PEEP & FiO2 as tolerated, maintain SpO2 > 88% - Head of bed elevated 30 degrees, VAP protocol in place - Plateau pressures less than 30 cm H20  - Intermittent chest x-ray & ABG PRN - Ensure adequate pulmonary hygiene  -will perform SAT/SBT when respiratory parameters are met  Vent Mode: PRVC FiO2 (%):  [45 %-60 %] 45 % Set Rate:  [16 bmp] 16 bmp Vt Set:  [550 mL] 550 mL PEEP:  [8 cmH20] 8 cmH20 Plateau Pressure:  [18 cmH20-20 cmH20] 20 cmH20   CARDIAC ECHO reports no significant issues -oxygen as needed -Lasix as tolerated -follow up cardiac enzymes as indicated   CARDIAC ICU monitoring   ACUTE KIDNEY INJURY/Renal Failure -continue Foley Catheter-assess need -Avoid nephrotoxic agents -Follow urine output, BMP -Ensure adequate renal perfusion, optimize oxygenation -Renal dose medications   Intake/Output Summary (Last 24 hours) at 02/11/2023 1646 Last data filed at 02/11/2023 1617 Gross per 24 hour  Intake 1797.44 ml  Output 3205 ml  Net -1407.56 ml    NEUROLOGY ACUTE METABOLIC ENCEPHALOPATHY -need for sedation -Goal RASS -2   SHOCK volume depleted -use vasopressors to keep MAP>65 as needed -follow ABG and LA -follow up cultures -emperic ABX   ENDO - ICU hypoglycemic\Hyperglycemia protocol -check FSBS per protocol   GI GI PROPHYLAXIS as indicated  NUTRITIONAL STATUS DIET-->consider TF's as tolerated tomorrow Constipation protocol as indicated PPI BID  ELECTROLYTES -follow labs as needed -replace as needed -pharmacy consultation and  following  ACUTE ANEMIA- TRANSFUSE AS NEEDED CONSIDER TRANSFUSION  IF HGB<7      Best practice (right click and "Reselect all SmartList Selections" daily)  Diet: NPO  DVT prophylaxis: Subcutaneous Heparin GI prophylaxis: H2B Glucose control:  SSI Yes Foley:  Yes, and it is still needed Mobility:  bed rest  Code Status:  DNR Disposition:ICU  Labs   CBC: Recent Labs  Lab 02/18/2023 1116 02/07/2023 1452 01/25/2023 2312 02/10/23 0438 02/10/23 1159 02/11/23 0953  WBC 15.2* 8.4 4.3 5.1 6.9 14.3*  NEUTROABS 8.2*  --   --   --  5.5 12.2*  HGB 12.8* 10.7* 13.5 12.9* 12.6* 11.8*  HCT 41.1 34.6* 41.0 38.3* 38.9* 35.4*  MCV 97.9 98.6 93.8 90.8 93.1 90.5  PLT 204 148* 168 175 173 170    Basic Metabolic Panel: Recent Labs  Lab 02/22/2023 1215 02/22/2023 1452 02/21/2023 1829 02/10/23 0438 02/10/23 1158 02/11/23 0530  NA 139  --  140 141 142 145  K 7.4* 5.3* 5.6* 5.8* 4.9 4.0  CL 99  --  100 99 101 102  CO2 24  --  22 25 25  23  GLUCOSE 125*  --  147* 142* 152* 188*  BUN 151*  --  145* 144* 138* 142*  CREATININE 6.97* 6.93* 6.68* 6.78* 6.75* 5.83*  CALCIUM 9.1  --  9.1 8.8* 8.6* 8.4*  MG  --   --  2.8* 2.5*  --  2.3  PHOS  --   --  6.0* 6.4*  --  6.6*   GFR: Estimated Creatinine Clearance: 16.4 mL/min (A) (by C-G formula based on SCr of 5.83 mg/dL (H)). Recent Labs  Lab 01/29/2023 1332 02/16/2023 1452 02/12/2023 2147 02/08/2023 2312 02/10/23 0438 02/10/23 1159 02/11/23 0953  WBC  --    < >  --  4.3 5.1 6.9 14.3*  LATICACIDVEN 3.4*  --  3.2* 2.9* 2.9*  --   --    < > = values in this interval not displayed.    Liver Function Tests: Recent Labs  Lab 02/11/2023 1215 02/10/23 0438  AST 148* 541*  ALT 67* 314*  ALKPHOS 46 50  BILITOT 0.5 1.2  PROT 6.7 6.1*  ALBUMIN 2.8* 2.5*   Recent Labs  Lab 01/25/2023 1215  LIPASE 31   No results for input(s): "AMMONIA" in the last 168 hours.  ABG    Component Value Date/Time   PHART 7.35 02/10/2023 1029   PCO2ART 43 02/10/2023  1029   PO2ART 75 (L) 02/10/2023 1029   HCO3 23.7 02/10/2023 1029   ACIDBASEDEF 2.0 02/10/2023 1029   O2SAT 96.5 02/10/2023 1029     Coagulation Profile: Recent Labs  Lab 02/23/2023 1116  INR 1.2   Home Medications  Prior to Admission medications   Medication Sig Start Date End Date Taking? Authorizing Provider  albuterol (PROVENTIL) (2.5 MG/3ML) 0.083% nebulizer solution Take 2.5 mg by nebulization every 6 (six) hours as needed. 03/04/16   [provider]  albuterol (VENTOLIN HFA) 108 (90 Base) MCG/ACT inhaler Inhale 2 puffs into the lungs every 6 (six) hours as needed for wheezing or shortness of breath.    [provider]  allopurinol (ZYLOPRIM) 100 MG tablet Take 200 mg by mouth daily. 05/27/14   [provider]  aspirin EC 81 MG tablet Take 81 mg by mouth daily. Swallow whole.    [provider]  atenolol (TENORMIN) 50 MG tablet Take 50 mg by mouth daily. 02/15/16   [provider]  atorvastatin (LIPITOR) 10 MG tablet Take 1 tablet (10 mg total) by mouth daily. 01/11/23   Leeroy Bock, MD  benazepril (LOTENSIN) 40 MG tablet Take 40 mg by mouth daily.    [provider]  Coenzyme Q10 100 MG capsule Take 100 mg by mouth 2 (two) times daily.    [provider]  DULoxetine (CYMBALTA) 60 MG capsule Take 60 mg by mouth daily.    [provider]  fluticasone (FLONASE) 50 MCG/ACT nasal spray Place 2 sprays into both nostrils daily as needed.    [provider]  levothyroxine (SYNTHROID) 25 MCG tablet Take 25 mcg by mouth daily before breakfast. 10/13/18   [provider]  meloxicam (MOBIC) 7.5 MG tablet Take 1 tablet (7.5 mg total) by mouth daily as needed for pain. 01/10/23   Leeroy Bock, MD  metolazone (ZAROXOLYN) 2.5 MG tablet Take 2.5 mg by mouth every 7 (seven) days.    [provider]  Multiple Vitamins-Minerals (MULTIVITAL PO) Take 1 tablet by mouth daily.    [provider]  omega-3 acid ethyl esters (LOVAZA) 1 g capsule Take 2 capsules  by mouth 2 (two) times daily.    [provider]  potassium chloride SA (K-DUR) 20 MEQ tablet Take 20 mEq by mouth daily. 09/06/18   [provider]  pregabalin (LYRICA) 100 MG capsule Take 1 capsule (100 mg total) by mouth 2 (two) times daily. 01/10/23 04-Mar-2023  Leeroy Bock, MD  RESTASIS 0.05 % ophthalmic emulsion Place 1 drop into both eyes 2 (two) times daily. 02/25/16   [provider]  SYMBICORT 160-4.5 MCG/ACT inhaler Inhale 2 puffs into the lungs 2 (two) times daily.    [provider]  torsemide 40 MG TABS Take 40 mg by mouth daily. 01/10/23   Leeroy Bock, MD  WEGOVY 0.5 MG/0.5ML SOAJ Inject 0.5 mg into the skin once a week. Saturday    [provider]        PATIENT WITH VERY POOR PROGNOSIS I ANTICIPATE PROLONGED ICU LOS     DVT/GI PRX  assessed I Assessed the need for Labs I Assessed the need for Foley I Assessed the need for Central Venous Line Family Discussion when available I Assessed the need for Mobilization I made an Assessment of medications to be adjusted accordingly Safety Risk assessment completed  CASE DISCUSSED IN MULTIDISCIPLINARY ROUNDS WITH ICU TEAM     Critical Care Time devoted to patient care services described in this note is 60 minutes.  Critical care was necessary to treat /prevent imminent and life-threatening deterioration. Overall, patient is critically ill, prognosis is guarded.  Patient with Multiorgan failure and at high risk for cardiac arrest and death.    Lucie Leather, M.D.  [facility]  Medical Director [facility] Medical Director [facility] Cardio-Pulmonary Department

## 2023-02-11 NOTE — Consult Note (Signed)
PHARMACY CONSULT NOTE - ELECTROLYTES  Pharmacy Consult for Electrolyte Monitoring and Replacement   Recent Labs: Potassium (mmol/L)  Date Value  02/11/2023 4.0   Magnesium (mg/dL)  Date Value  08/65/7846 2.3   Calcium (mg/dL)  Date Value  96/29/5284 8.4 (L)   Albumin (g/dL)  Date Value  13/24/4010 2.5 (L)  09/07/2017 4.5   Phosphorus (mg/dL)  Date Value  27/25/3664 6.6 (H)   Sodium (mmol/L)  Date Value  02/11/2023 145  09/07/2017 143   Height: 5' 9.02" (175.3 cm) Weight: (!) 181.4 kg (400 lb) IBW/kg (Calculated) : 70.74 Estimated Creatinine Clearance: 16.4 mL/min (A) (by C-G formula based on SCr of 5.83 mg/dL (H)).  Assessment  Jay Sandoval is a 82 y.o. male presenting with altered mental status and diarrhea x several days. PMH significant for COPD / asthma, CHF, HTN, GERD, OSA. Pharmacy has been consulted to monitor and replace electrolytes.  Diet: NPO MIVF: N/A, intermittent fluid boluses Pertinent medications: Lokelma  Goal of Therapy: Electrolytes within normal limits  Plan:  Potassium has normalized with Lokelma and other implemented therapies Repeat BMP tomorrow AM  Thank you for allowing pharmacy to be a part of this patient's care.  Tressie Ellis 02/11/2023 9:55 AM

## 2023-02-11 NOTE — Progress Notes (Signed)
Daily Progress Note   Patient Name: WILLBERT MOLIERE       Date: 02/11/2023 DOB: 12-10-1940  Age: 82 y.o. MRN#: [id number] Attending Physician: Erin Fulling, MD Primary Care Physician: Barbette Reichmann, MD Admit Date: 02/10/2023  Reason for Consultation/Follow-up: Establishing goals of care  HPI/Brief Hospital Review: 82 y.o. male  with past medical history of morbid obesity, HTN, osteoarthritis and OSA admitted from home on 02/10/2023 with acute hypoxic respiratory failure, cardiogenic shock, hypovolemic shock complicated by severe metabolic encephalopathy and acute renal failure.   Required endotracheal intubation, remains sedated on ventilator    Palliative medicine was consulted for assisting with goals of care conversations.  Subjective: Extensive chart review has been completed prior to meeting patient including labs, vital signs, imaging, progress notes, orders, and available advanced directive documents from current and previous encounters.    Visited with Mr. Elkind at his bedside.  He remains intubated and sedated.  Hemoglobin remained stable.  He remains on vasopressor support.  No family at bedside during time of visit.  As discussed with family yesterday plan remains to allow time for outcomes.  No acute palliative needs at this time.  PMT to remain available for ongoing needs and support.  Care plan was discussed with CCU team and nursing staff  Thank you for allowing the Palliative Medicine Team to assist in the care of this patient.  Total time:  25 minutes  Time spent includes: Detailed review of medical records (labs, imaging, vital signs), medically appropriate exam (mental status, respiratory, cardiac, skin), discussed with treatment team, counseling and educating  patient, family and staff, documenting clinical information, medication management and coordination of care.  Leeanne Deed, DNP, AGNP-C Palliative Medicine   Please contact Palliative Medicine Team phone at [phone] for questions and concerns.

## 2023-02-12 ENCOUNTER — Inpatient Hospital Stay: Payer: 59

## 2023-02-12 DIAGNOSIS — Z515 Encounter for palliative care: Secondary | ICD-10-CM | POA: Diagnosis not present

## 2023-02-12 DIAGNOSIS — J9601 Acute respiratory failure with hypoxia: Secondary | ICD-10-CM | POA: Diagnosis not present

## 2023-02-12 DIAGNOSIS — N179 Acute kidney failure, unspecified: Secondary | ICD-10-CM | POA: Diagnosis not present

## 2023-02-12 DIAGNOSIS — K922 Gastrointestinal hemorrhage, unspecified: Secondary | ICD-10-CM | POA: Diagnosis not present

## 2023-02-12 LAB — BASIC METABOLIC PANEL
Anion gap: 16 — ABNORMAL HIGH (ref 5–15)
BUN: 118 mg/dL — ABNORMAL HIGH (ref 8–23)
CO2: 27 mmol/L (ref 22–32)
Calcium: 8.4 mg/dL — ABNORMAL LOW (ref 8.9–10.3)
Chloride: 107 mmol/L (ref 98–111)
Creatinine, Ser: 4.62 mg/dL — ABNORMAL HIGH (ref 0.61–1.24)
GFR, Estimated: 12 mL/min — ABNORMAL LOW (ref >60)
Glucose, Bld: 167 mg/dL — ABNORMAL HIGH (ref 70–99)
Potassium: 2.6 mmol/L — CL (ref 3.5–5.1)
Sodium: 150 mmol/L — ABNORMAL HIGH (ref 135–145)

## 2023-02-12 LAB — CBC
HCT: 31.4 % — ABNORMAL LOW (ref 39.0–52.0)
Hemoglobin: 10.8 g/dL — ABNORMAL LOW (ref 13.0–17.0)
MCH: 30.4 pg (ref 26.0–34.0)
MCHC: 34.4 g/dL (ref 30.0–36.0)
MCV: 88.5 fL (ref 80.0–100.0)
Platelets: 151 10*3/uL (ref 150–400)
RBC: 3.55 MIL/uL — ABNORMAL LOW (ref 4.22–5.81)
RDW: 15.3 % (ref 11.5–15.5)
WBC: 19.3 10*3/uL — ABNORMAL HIGH (ref 4.0–10.5)
nRBC: 0.6 % — ABNORMAL HIGH (ref 0.0–0.2)

## 2023-02-12 LAB — MAGNESIUM
Magnesium: 2.3 mg/dL (ref 1.7–2.4)
Magnesium: 3 mg/dL — ABNORMAL HIGH (ref 1.7–2.4)
Magnesium: 2.7 mg/dL — ABNORMAL HIGH (ref 1.7–2.4)

## 2023-02-12 LAB — GLUCOSE, CAPILLARY
Glucose-Capillary: 150 mg/dL — ABNORMAL HIGH (ref 70–99)
Glucose-Capillary: 163 mg/dL — ABNORMAL HIGH (ref 70–99)
Glucose-Capillary: 165 mg/dL — ABNORMAL HIGH (ref 70–99)
Glucose-Capillary: 169 mg/dL — ABNORMAL HIGH (ref 70–99)
Glucose-Capillary: 171 mg/dL — ABNORMAL HIGH (ref 70–99)
Glucose-Capillary: 186 mg/dL — ABNORMAL HIGH (ref 70–99)
Glucose-Capillary: 84 mg/dL (ref 70–99)

## 2023-02-12 LAB — CULTURE, BLOOD (ROUTINE X 2): Special Requests: ADEQUATE

## 2023-02-12 LAB — PHOSPHORUS
Phosphorus: 4.4 mg/dL (ref 2.5–4.6)
Phosphorus: 3.6 mg/dL (ref 2.5–4.6)

## 2023-02-12 LAB — POTASSIUM: Potassium: 2.9 mmol/L — ABNORMAL LOW (ref 3.5–5.1)

## 2023-02-12 MED ORDER — POTASSIUM CHLORIDE 10 MEQ/50ML IV SOLN
10.0000 meq | INTRAVENOUS | Status: AC
  Administered 2023-02-12 (×6): 10 meq via INTRAVENOUS
  Filled 2023-02-12 (×6): qty 50

## 2023-02-12 MED ORDER — CHLORHEXIDINE GLUCONATE CLOTH 2 % EX PADS
6.0000 | MEDICATED_PAD | Freq: Every day | CUTANEOUS | Status: DC
  Administered 2023-02-12 – 2023-02-14 (×3): 6 via TOPICAL

## 2023-02-12 MED ORDER — VITAL 1.5 CAL PO LIQD
1200.0000 mL | ORAL | Status: DC
  Administered 2023-02-12: 1200 mL

## 2023-02-12 MED ORDER — LORAZEPAM 2 MG/ML IJ SOLN
INTRAMUSCULAR | Status: AC
  Administered 2023-02-12: 4 mg via INTRAVENOUS
  Filled 2023-02-12: qty 2

## 2023-02-12 MED ORDER — LORAZEPAM 2 MG/ML IJ SOLN: 4.0000 mg | Freq: Once | INTRAMUSCULAR | Status: AC

## 2023-02-12 MED ORDER — PROSOURCE TF20 ENFIT COMPATIBL EN LIQD
60.0000 mL | Freq: Every day | ENTERAL | Status: DC
  Administered 2023-02-12 – 2023-02-13 (×2): 60 mL
  Filled 2023-02-12 (×2): qty 60

## 2023-02-12 MED ORDER — FREE WATER
150.0000 mL | Status: DC
  Administered 2023-02-12 – 2023-02-13 (×5): 150 mL

## 2023-02-12 MED ORDER — MAGNESIUM SULFATE 2 GM/50ML IV SOLN
2.0000 g | Freq: Once | INTRAVENOUS | Status: AC
  Administered 2023-02-12: 2 g via INTRAVENOUS
  Filled 2023-02-12: qty 50

## 2023-02-12 MED ORDER — POTASSIUM CHLORIDE 10 MEQ/100ML IV SOLN: 10.0000 meq | INTRAVENOUS | Status: DC

## 2023-02-12 NOTE — Progress Notes (Signed)
Daily Progress Note   Patient Name: Jay Sandoval       Date: 02/12/2023 DOB: 05-30-1940  Age: 82 y.o. MRN#: [id number] Attending Physician: Erin Fulling, MD Primary Care Physician: Barbette Reichmann, MD Admit Date: 01/31/2023  Reason for Consultation/Follow-up: Establishing goals of care  HPI/Brief Hospital Review: 82 y.o. male  with past medical history of morbid obesity, HTN, osteoarthritis and OSA admitted from home on 01/30/2023 with acute hypoxic respiratory failure, cardiogenic shock, hypovolemic shock complicated by severe metabolic encephalopathy and acute renal failure.   Required endotracheal intubation, remains sedated on ventilator   Remains on 45% FiO2 and on vasopressor as well as Levophed   Palliative medicine was consulted for assisting with goals of care conversations.  Subjective: Extensive chart review has been completed prior to meeting patient including labs, vital signs, imaging, progress notes, orders, and available advanced directive documents from current and previous encounters.    Visited with Jay Sandoval at his bedside he remains intubated and sedated.  Niece Para March at bedside during time of visit.  Provided Para March with most recent medical updates such as Jay Sandoval continues to require quite a bit of support from mechanical ventilation as well as remaining going to vasopressor medications to support his blood pressure.  Unfortunately since our initial conversation had with Dr. Belia Heman there have not been significant improvements.  Para March shares at this time she continues to request time for outcomes with an understanding of the severity of Jay Sandoval condition as well as an understanding of the potential timeframe for Jay Sandoval to remain on mechanical ventilation  with the associated risks of prolonged intubation.  Answered and addressed all questions and concerns.  PMT to continue to follow for ongoing needs and support.  Care plan was discussed with nursing staff.  Thank you for allowing the Palliative Medicine Team to assist in the care of this patient.  Total time:  25 minutes  Time spent includes: Detailed review of medical records (labs, imaging, vital signs), medically appropriate exam (mental status, respiratory, cardiac, skin), discussed with treatment team, counseling and educating patient, family and staff, documenting clinical information, medication management and coordination of care.  Leeanne Deed, DNP, AGNP-C Palliative Medicine   Please contact Palliative Medicine Team phone at [phone] for questions and concerns.

## 2023-02-12 NOTE — Progress Notes (Signed)
Nutrition Follow-up  DOCUMENTATION CODES:   Morbid obesity  INTERVENTION:  Initiate tube feeding via OGT: Vital 1.5 at 50 ml/h (1200 ml per day) Start at 7ml/hr, increase 10ml every 8 hrs till goal of 54ml/hr is met.  Prosource TF20 60 ml  1 x day FWF every 4 hours Monitor magnesium, potassium, and phosphorus BID for at least 3 days, MD to replete as needed, as pt is at risk for refeeding syndrome given inadequate oral intake.   Provides 1880 kcal, 110 gm protein, 973 + 900=1873 ml free water daily    NUTRITION DIAGNOSIS:   Inadequate oral intake related to inability to eat as evidenced by NPO status.    GOAL:   Provide needs based on ASPEN/SCCM guidelines    MONITOR:   Vent status  REASON FOR ASSESSMENT:   Consult Enteral/tube feeding initiation and management  ASSESSMENT:   Pt admitted for acute hypoxic resp failure due to probable underlying pneumonia with probable acute cardiac failure with shock likely due to combination of sepsis/cardiogenic/Hypovolumic complicated by severe metabolic acidosis and acute metabolic encephalopathy with  acute renal failure. PMHX Arthitis, Asthma, HTN  RD consulted for TF initiation. And free water flushes.  Pt continues to be intubated and sedated.  Remote assessment all information obtained Through EMR and team.  02-16-23- NG/OG vented/dual lumen 90F oral marking at corner of mouth 68cm. Low intermittent suction NUTRITION - FOCUSED PHYSICAL EXAM:  Flowsheet Row Most Recent Value  Orbital Region No depletion  Upper Arm Region No depletion  Thoracic and Lumbar Region No depletion  Buccal Region No depletion  Temple Region No depletion  Clavicle Bone Region No depletion  Clavicle and Acromion Bone Region No depletion  Scapular Bone Region No depletion  Dorsal Hand No depletion  Patellar Region No depletion  Anterior Thigh Region No depletion  Posterior Calf Region No depletion  Edema (RD Assessment) Mild  Hair  Reviewed  Eyes Reviewed  Mouth Reviewed  Skin Reviewed  Nails Reviewed       Diet Order:   Diet Order             Diet NPO time specified  Diet effective now                   EDUCATION NEEDS:   Not appropriate for education at this time  Skin:  Skin Assessment: Reviewed RN Assessment  Last BM:  10/18  Height:   Ht Readings from Last 1 Encounters:  16-Feb-2023 5' 9.02" (1.753 m)    Weight:   Wt Readings from Last 1 Encounters:  02/12/23 (!) 176.8 kg    Ideal Body Weight:  72.7 kg  BMI:  Body mass index is 57.52 kg/m.  Estimated Nutritional Needs:   Kcal:  1700-2100 kcal  Protein:  110-145 g  Fluid:  50ml/kcal    Jamelle Haring RDN, LDN Clinical Dietitian  RDN pager # available on Amion

## 2023-02-12 NOTE — Plan of Care (Signed)
Pt agitated this morning despite prn versed. Verbal order for 4mg  ativan iv administered and effective. Pt bites tube aggressively when agitated or awake. Unable to follow commands at any time this shift. Tube feeds initiated per orders. Will continue to monitor.

## 2023-02-12 NOTE — Consult Note (Signed)
PHARMACY CONSULT NOTE - ELECTROLYTES  Pharmacy Consult for Electrolyte Monitoring and Replacement   Recent Labs: Potassium (mmol/L)  Date Value  02/12/2023 2.6 (LL)   Magnesium (mg/dL)  Date Value  16/01/9603 2.3   Calcium (mg/dL)  Date Value  54/12/8117 8.4 (L)   Albumin (g/dL)  Date Value  14/78/2956 2.5 (L)  09/07/2017 4.5   Phosphorus (mg/dL)  Date Value  21/30/8657 6.6 (H)   Sodium (mmol/L)  Date Value  02/12/2023 150 (H)  09/07/2017 143   Height: 5' 9.02" (175.3 cm) Weight: (!) 176.8 kg (389 lb 11.2 oz) IBW/kg (Calculated) : 70.74 Estimated Creatinine Clearance: 20.4 mL/min (A) (by C-G formula based on SCr of 4.62 mg/dL (H)).  Assessment  Jay Sandoval is a 82 y.o. male presenting with altered mental status and diarrhea x several days. PMH significant for COPD / asthma, CHF, HTN, GERD, OSA. Pharmacy has been consulted to monitor and replace electrolytes.  Diet: NPO MIVF: N/A, intermittent fluid boluses Pertinent medications: N/A  Goal of Therapy: Electrolytes within normal limits  Plan:  Na 150, suspect secondary to free water deficit. Defer management to PCCM K 2.6, Kcl 10 mEq IV q1h x 6 & magnesium sulfate 2 g IV x 1 per PCCM Repeat K+ after completion of potassium replacement at 1500. Check all electrolytes again tomorrow AM  Thank you for allowing pharmacy to be a part of this patient's care.  Tressie Ellis 02/12/2023 6:59 AM

## 2023-02-12 NOTE — Progress Notes (Signed)
[facility]  PROGRESS NOTE   Subjective:   Patient is vented and sedated. Still hypotensive on Levophed and vasopressin. Urine output is acceptable. Patient is now DNR. He also has diarrhea.  Objective:  Vital signs: Blood pressure (!) 116/54, pulse 94, temperature 97.9 F (36.6 C), temperature source Esophageal, resp. rate 16, height 5' 9.02" (1.753 m), weight (!) 176.8 kg, SpO2 (!) 88%.  Intake/Output Summary (Last 24 hours) at 02/12/2023 1221 Last data filed at 02/12/2023 7829 Gross per 24 hour  Intake 724.39 ml  Output 1800 ml  Net -1075.61 ml   Filed Weights   02/10/23 0500 02/11/23 0500 02/12/23 0434  Weight: (!) 179.6 kg (!) 181.4 kg (!) 176.8 kg     Physical Exam: General:  No acute distress  Head:  Normocephalic, atraumatic. Moist oral mucosal membranes  Eyes:  Anicteric  Neck:  Supple  Lungs:   Clear to auscultation, normal effort  Heart:  S1S2 no rubs  Abdomen:   Soft, nontender, bowel sounds present  Extremities:  peripheral edema.  Neurologic: Sedated on the vent  Skin:  No lesions  Access:     Basic Metabolic Panel: Recent Labs  Lab 02/03/2023 1829 02/10/23 0438 02/10/23 1158 02/11/23 0530 02/12/23 0412  NA 140 141 142 145 150*  K 5.6* 5.8* 4.9 4.0 2.6*  CL 100 99 101 102 107  CO2 22 25 25 23 27   GLUCOSE 147* 142* 152* 188* 167*  BUN 145* 144* 138* 142* 118*  CREATININE 6.68* 6.78* 6.75* 5.83* 4.62*  CALCIUM 9.1 8.8* 8.6* 8.4* 8.4*  MG 2.8* 2.5*  --  2.3 2.3  PHOS 6.0* 6.4*  --  6.6*  --    GFR: Estimated Creatinine Clearance: 20.4 mL/min (A) (by C-G formula based on SCr of 4.62 mg/dL (H)).  Liver Function Tests: Recent Labs  Lab 02/10/2023 1215 02/10/23 0438  AST 148* 541*  ALT 67* 314*  ALKPHOS 46 50  BILITOT 0.5 1.2  PROT 6.7 6.1*  ALBUMIN 2.8* 2.5*   Recent Labs  Lab 02/12/2023 1215  LIPASE 31   No results for input(s): "AMMONIA" in the last 168 hours.  CBC: Recent Labs  Lab 02/18/2023 1116 02/23/2023 1452  01/31/2023 2312 02/10/23 0438 02/10/23 1159 02/11/23 0953 02/12/23 0413  WBC 15.2*   < > 4.3 5.1 6.9 14.3* 19.3*  NEUTROABS 8.2*  --   --   --  5.5 12.2*  --   HGB 12.8*   < > 13.5 12.9* 12.6* 11.8* 10.8*  HCT 41.1   < > 41.0 38.3* 38.9* 35.4* 31.4*  MCV 97.9   < > 93.8 90.8 93.1 90.5 88.5  PLT 204   < > 168 175 173 170 151   < > = values in this interval not displayed.     HbA1C: No results found for: "HGBA1C"  Urinalysis: Recent Labs    02/17/2023 1332  COLORURINE YELLOW*  LABSPEC 1.016  PHURINE 5.0  GLUCOSEU NEGATIVE  HGBUR SMALL*  BILIRUBINUR NEGATIVE  KETONESUR NEGATIVE  PROTEINUR NEGATIVE  NITRITE NEGATIVE  LEUKOCYTESUR NEGATIVE      Imaging: DG Chest Port 1 View  Result Date: 02/12/2023 CLINICAL DATA:  Respiratory failure. EXAM: PORTABLE CHEST 1 VIEW COMPARISON:  One-view chest x-ray 02/06/2023 FINDINGS: The heart is enlarged. Left pleural effusion is present. Increasing interstitial and airspace opacities are present in the left lung. Mild right-sided edema has progressed. Endotracheal tube is stable and in satisfactory position. Gastric tube courses off the inferior border of the film.  A left IJ line terminates in the innominate vein. IMPRESSION: 1. Increasing interstitial and airspace opacities in the left lung likely reflecting edema. Infection is not excluded. 2. Stable left pleural effusion. 3. Progressive right-sided edema. Electronically Signed   By: Marin Roberts M.D.   On: 02/12/2023 10:42     Medications:    feeding supplement (VITAL 1.5 CAL)     fentaNYL infusion INTRAVENOUS 225 mcg/hr (02/12/23 1156)   norepinephrine (LEVOPHED) Adult infusion 4 mcg/min (02/12/23 0828)   piperacillin-tazobactam (ZOSYN)  IV 2.25 g (02/12/23 0501)   potassium chloride 10 mEq (02/12/23 1155)   vasopressin 0.03 Units/min (02/12/23 0914)    budesonide (PULMICORT) nebulizer solution  0.5 mg Nebulization BID   Chlorhexidine Gluconate Cloth  6 each Topical Daily    docusate  100 mg Per Tube BID   feeding supplement (PROSource TF20)  60 mL Per Tube Daily   free water  150 mL Per Tube Q4H   hydrocortisone sod succinate (SOLU-CORTEF) inj  50 mg Intravenous Q6H   ipratropium-albuterol  3 mL Nebulization Q4H   mouth rinse  15 mL Mouth Rinse Q2H   pantoprazole (PROTONIX) IV  40 mg Intravenous Q12H   polyethylene glycol  17 g Per Tube Daily   sodium chloride flush  10 mL Intravenous Q12H   sodium chloride flush  10-40 mL Intracatheter Q12H   sodium chloride flush  3 mL Intravenous Q12H    Assessment/ Plan:     Jay Sandoval is a 82 y.o.  male with medical problems of  asthma, chronic kidney disease, congestive heart failure, COPD, GERD, glaucoma, hemorrhoids, hyperlipidemia, hypertension, obesity, psoriasis, sleep apnea   was admitted on 02/08/2023 for : Upper GI bleeding [K92.2] Acute respiratory failure with hypoxia (HCC) [J96.01] Acute renal failure, unspecified acute renal failure type (HCC) [N17.9]   #1: Acute kidney injury: Acute kidney injury most likely secondary to acute tubular necrosis due to hemodynamic compromise.  Urine output has slowly improved.  Will continue to monitor closely.  Patient is a poor candidate for renal replacement therapy.   #2: Hypokalemia: Most likely secondary to diarrhea.  Agree with supplementation.    #3: Hypotension/sepsis: Continue pressors and wean as tolerated.  Continue Zosyn as ordered.  #4: Hyponatremia: This is most likely secondary to decreased free water intake.  Recommend free water flushes via NGT.  #5: Hypoxic respiratory failure requiring ventilation: Patient is DNR now.  Continue supportive care.   ICU attending Dr. Belia Heman note appreciated. Patient is now DNR and DNI. Labs and medications reviewed. Will continue to follow along with you.  Labs and medications reviewed. Will continue to follow along with you.   LOS: 3 Lorain Childes, MD [facility] 10/20/202412:21  PM

## 2023-02-12 NOTE — Consult Note (Signed)
PHARMACY CONSULT NOTE - ELECTROLYTES  Pharmacy Consult for Electrolyte Monitoring and Replacement   Recent Labs: Potassium (mmol/L)  Date Value  02/12/2023 2.9 (L)   Magnesium (mg/dL)  Date Value  16/01/9603 3.0 (H)   Calcium (mg/dL)  Date Value  54/12/8117 8.4 (L)   Albumin (g/dL)  Date Value  14/78/2956 2.5 (L)  09/07/2017 4.5   Phosphorus (mg/dL)  Date Value  21/30/8657 4.4   Sodium (mmol/L)  Date Value  02/12/2023 150 (H)  09/07/2017 143   Height: 5' 9.02" (175.3 cm) Weight: (!) 176.8 kg (389 lb 11.2 oz) IBW/kg (Calculated) : 70.74 Estimated Creatinine Clearance: 20.4 mL/min (A) (by C-G formula based on SCr of 4.62 mg/dL (H)).  Assessment  Jay Sandoval is a 82 y.o. male presenting with altered mental status and diarrhea x several days. PMH significant for COPD / asthma, CHF, HTN, GERD, OSA. Pharmacy has been consulted to monitor and replace electrolytes.  Diet: NPO MIVF: N/A, intermittent fluid boluses Pertinent medications: N/A  Goal of Therapy: Electrolytes within normal limits  Plan:  K 2.6 > 2.9 after aggressive supplementation this AM. Will order additional Kcl 10 mEq IV q1h x 6 for this evening Check all electrolytes again tomorrow AM  Thank you for allowing pharmacy to be a part of this patient's care.  Elliot Gurney, PharmD, BCPS Clinical Pharmacist  02/12/2023 4:05 PM

## 2023-02-12 NOTE — Plan of Care (Signed)
Patient remains in AR-ICU at time of writing. Patient remains intubated and sedated; requiring vasopressor support. CVC, Foley, and OGT remain in place. Patient remains on infusions of Levophed, Vasopressin, and Fentanyl. Potassium replacement on-going. Patient is arousable to stimuli but is not following commands.  Edit: 0430 hrs: Spoke with Webb Silversmith, NP regarding patient's isolation status, blood glucose trend, and potassium level. See new orders.   Problem: Education: Goal: Knowledge of General Education information will improve Description: Including pain rating scale, medication(s)/side effects and non-pharmacologic comfort measures Outcome: Not Progressing   Problem: Health Behavior/Discharge Planning: Goal: Ability to manage health-related needs will improve Outcome: Not Progressing   Problem: Clinical Measurements: Goal: Ability to maintain clinical measurements within normal limits will improve Outcome: Not Progressing Goal: Will remain free from infection Outcome: Not Progressing Goal: Diagnostic test results will improve Outcome: Not Progressing Goal: Respiratory complications will improve Outcome: Not Progressing Goal: Cardiovascular complication will be avoided Outcome: Not Progressing   Problem: Activity: Goal: Risk for activity intolerance will decrease Outcome: Not Progressing   Problem: Nutrition: Goal: Adequate nutrition will be maintained Outcome: Not Progressing   Problem: Coping: Goal: Level of anxiety will decrease Outcome: Not Progressing   Problem: Elimination: Goal: Will not experience complications related to bowel motility Outcome: Not Progressing Goal: Will not experience complications related to urinary retention Outcome: Not Progressing   Problem: Pain Managment: Goal: General experience of comfort will improve Outcome: Not Progressing   Problem: Safety: Goal: Ability to remain free from injury will improve Outcome: Not  Progressing   Problem: Skin Integrity: Goal: Risk for impaired skin integrity will decrease Outcome: Not Progressing

## 2023-02-13 ENCOUNTER — Inpatient Hospital Stay: Payer: 59

## 2023-02-13 DIAGNOSIS — J9601 Acute respiratory failure with hypoxia: Secondary | ICD-10-CM | POA: Diagnosis not present

## 2023-02-13 DIAGNOSIS — A419 Sepsis, unspecified organism: Secondary | ICD-10-CM

## 2023-02-13 DIAGNOSIS — K922 Gastrointestinal hemorrhage, unspecified: Secondary | ICD-10-CM | POA: Diagnosis not present

## 2023-02-13 DIAGNOSIS — N179 Acute kidney failure, unspecified: Secondary | ICD-10-CM | POA: Diagnosis not present

## 2023-02-13 LAB — HEPATIC FUNCTION PANEL
ALT: 139 U/L — ABNORMAL HIGH (ref 0–44)
AST: 67 U/L — ABNORMAL HIGH (ref 15–41)
Albumin: 2 g/dL — ABNORMAL LOW (ref 3.5–5.0)
Alkaline Phosphatase: 83 U/L (ref 38–126)
Bilirubin, Direct: 0.2 mg/dL (ref 0.0–0.2)
Indirect Bilirubin: 0.9 mg/dL (ref 0.3–0.9)
Total Bilirubin: 1.1 mg/dL (ref 0.3–1.2)
Total Protein: 5.6 g/dL — ABNORMAL LOW (ref 6.5–8.1)

## 2023-02-13 LAB — BASIC METABOLIC PANEL
Anion gap: 14 (ref 5–15)
BUN: 139 mg/dL — ABNORMAL HIGH (ref 8–23)
CO2: 27 mmol/L (ref 22–32)
Calcium: 8.7 mg/dL — ABNORMAL LOW (ref 8.9–10.3)
Chloride: 114 mmol/L — ABNORMAL HIGH (ref 98–111)
Creatinine, Ser: 3.36 mg/dL — ABNORMAL HIGH (ref 0.61–1.24)
GFR, Estimated: 18 mL/min — ABNORMAL LOW (ref >60)
Glucose, Bld: 214 mg/dL — ABNORMAL HIGH (ref 70–99)
Potassium: 3 mmol/L — ABNORMAL LOW (ref 3.5–5.1)
Sodium: 155 mmol/L — ABNORMAL HIGH (ref 135–145)

## 2023-02-13 LAB — GASTROINTESTINAL PANEL BY PCR, STOOL (REPLACES STOOL CULTURE)

## 2023-02-13 LAB — CBC WITH DIFFERENTIAL/PLATELET
Abs Immature Granulocytes: 0.45 10*3/uL — ABNORMAL HIGH (ref 0.00–0.07)
Basophils Absolute: 0.1 10*3/uL (ref 0.0–0.1)
Basophils Relative: 1 %
Eosinophils Absolute: 0 10*3/uL (ref 0.0–0.5)
Eosinophils Relative: 0 %
HCT: 33.5 % — ABNORMAL LOW (ref 39.0–52.0)
Hemoglobin: 11 g/dL — ABNORMAL LOW (ref 13.0–17.0)
Immature Granulocytes: 2 %
Lymphocytes Relative: 8 %
Lymphs Abs: 1.6 10*3/uL (ref 0.7–4.0)
MCH: 30.1 pg (ref 26.0–34.0)
MCHC: 32.8 g/dL (ref 30.0–36.0)
MCV: 91.8 fL (ref 80.0–100.0)
Monocytes Absolute: 1.8 10*3/uL — ABNORMAL HIGH (ref 0.1–1.0)
Monocytes Relative: 10 %
Neutro Abs: 15.1 10*3/uL — ABNORMAL HIGH (ref 1.7–7.7)
Neutrophils Relative %: 79 %
Platelets: 149 10*3/uL — ABNORMAL LOW (ref 150–400)
RBC: 3.65 MIL/uL — ABNORMAL LOW (ref 4.22–5.81)
RDW: 16 % — ABNORMAL HIGH (ref 11.5–15.5)
Smear Review: NORMAL
WBC: 19 10*3/uL — ABNORMAL HIGH (ref 4.0–10.5)
nRBC: 0.4 % — ABNORMAL HIGH (ref 0.0–0.2)

## 2023-02-13 LAB — POTASSIUM
Potassium: 2.5 mmol/L — CL (ref 3.5–5.1)
Potassium: 5.3 mmol/L — ABNORMAL HIGH (ref 3.5–5.1)

## 2023-02-13 LAB — MAGNESIUM
Magnesium: 2.9 mg/dL — ABNORMAL HIGH (ref 1.7–2.4)
Magnesium: 2.8 mg/dL — ABNORMAL HIGH (ref 1.7–2.4)

## 2023-02-13 LAB — TROPONIN I (HIGH SENSITIVITY): Troponin I (High Sensitivity): 205 ng/L (ref <18)

## 2023-02-13 LAB — GLUCOSE, CAPILLARY
Glucose-Capillary: 184 mg/dL — ABNORMAL HIGH (ref 70–99)
Glucose-Capillary: 201 mg/dL — ABNORMAL HIGH (ref 70–99)
Glucose-Capillary: 210 mg/dL — ABNORMAL HIGH (ref 70–99)
Glucose-Capillary: 190 mg/dL — ABNORMAL HIGH (ref 70–99)
Glucose-Capillary: 162 mg/dL — ABNORMAL HIGH (ref 70–99)
Glucose-Capillary: 152 mg/dL — ABNORMAL HIGH (ref 70–99)

## 2023-02-13 LAB — PHOSPHORUS
Phosphorus: 3.3 mg/dL (ref 2.5–4.6)
Phosphorus: 2 mg/dL — ABNORMAL LOW (ref 2.5–4.6)
Phosphorus: 4.9 mg/dL — ABNORMAL HIGH (ref 2.5–4.6)

## 2023-02-13 LAB — PROCALCITONIN: Procalcitonin: 5.63 ng/mL

## 2023-02-13 LAB — HEMOGLOBIN A1C
Hgb A1c MFr Bld: 5.9 % — ABNORMAL HIGH (ref 4.8–5.6)
Mean Plasma Glucose: 122.63 mg/dL

## 2023-02-13 LAB — TSH: TSH: 1.182 u[IU]/mL (ref 0.350–4.500)

## 2023-02-13 LAB — ACETAMINOPHEN LEVEL: Acetaminophen (Tylenol), Serum: <10 ug/mL — ABNORMAL LOW (ref 10–30)

## 2023-02-13 MED ORDER — LOPERAMIDE HCL 1 MG/7.5ML PO SUSP
2.0000 mg | ORAL | Status: DC | PRN
  Administered 2023-02-13: 2 mg
  Filled 2023-02-13 (×2): qty 15

## 2023-02-13 MED ORDER — PROPOFOL 1000 MG/100ML IV EMUL
5.0000 ug/kg/min | INTRAVENOUS | Status: DC
  Administered 2023-02-13: 30 ug/kg/min via INTRAVENOUS
  Filled 2023-02-13 (×2): qty 100

## 2023-02-13 MED ORDER — POTASSIUM CHLORIDE 2 MEQ/ML IV SOLN
INTRAVENOUS | Status: DC
  Filled 2023-02-13 (×3): qty 1000

## 2023-02-13 MED ORDER — PROSOURCE TF20 ENFIT COMPATIBL EN LIQD
60.0000 mL | Freq: Every day | ENTERAL | Status: DC
  Administered 2023-02-13 – 2023-02-14 (×6): 60 mL
  Filled 2023-02-13: qty 60

## 2023-02-13 MED ORDER — HEPARIN SODIUM (PORCINE) 5000 UNIT/ML IJ SOLN
5000.0000 [IU] | Freq: Two times a day (BID) | INTRAMUSCULAR | Status: DC
  Administered 2023-02-13 – 2023-02-14 (×3): 5000 [IU] via SUBCUTANEOUS
  Filled 2023-02-13 (×3): qty 1

## 2023-02-13 MED ORDER — ROCURONIUM BROMIDE 10 MG/ML (PF) SYRINGE
100.0000 mg | PREFILLED_SYRINGE | Freq: Once | INTRAVENOUS | Status: AC
  Administered 2023-02-13: 100 mg via INTRAVENOUS
  Filled 2023-02-13: qty 10

## 2023-02-13 MED ORDER — ETOMIDATE 2 MG/ML IV SOLN
20.0000 mg | Freq: Once | INTRAVENOUS | Status: AC
  Administered 2023-02-13: 20 mg via INTRAVENOUS
  Filled 2023-02-13: qty 10

## 2023-02-13 MED ORDER — FENTANYL CITRATE PF 50 MCG/ML IJ SOSY
25.0000 ug | PREFILLED_SYRINGE | INTRAMUSCULAR | Status: DC | PRN
  Administered 2023-02-14: 25 ug via INTRAVENOUS
  Filled 2023-02-13: qty 1

## 2023-02-13 MED ORDER — POTASSIUM CHLORIDE 20 MEQ PO PACK
40.0000 meq | PACK | Freq: Three times a day (TID) | ORAL | Status: DC
  Administered 2023-02-13 (×2): 40 meq
  Filled 2023-02-13 (×2): qty 2

## 2023-02-13 MED ORDER — FREE WATER
200.0000 mL | Status: DC
  Administered 2023-02-13 – 2023-02-14 (×10): 200 mL

## 2023-02-13 MED ORDER — POTASSIUM CHLORIDE CRYS ER 20 MEQ PO TBCR: 40.0000 meq | EXTENDED_RELEASE_TABLET | Freq: Three times a day (TID) | ORAL | Status: DC

## 2023-02-13 MED ORDER — DEXTROSE 5 % IV SOLN: INTRAVENOUS | Status: DC

## 2023-02-13 MED ORDER — FENTANYL CITRATE PF 50 MCG/ML IJ SOSY
25.0000 ug | PREFILLED_SYRINGE | INTRAMUSCULAR | Status: DC | PRN
  Administered 2023-02-13 – 2023-02-14 (×7): 100 ug via INTRAVENOUS
  Filled 2023-02-13 (×7): qty 2

## 2023-02-13 MED ORDER — POTASSIUM CHLORIDE 10 MEQ/100ML IV SOLN: 10.0000 meq | INTRAVENOUS | Status: DC

## 2023-02-13 MED ORDER — ADULT MULTIVITAMIN LIQUID CH
15.0000 mL | Freq: Every day | ORAL | Status: DC
  Administered 2023-02-14: 15 mL
  Filled 2023-02-13: qty 15

## 2023-02-13 MED ORDER — POTASSIUM PHOSPHATES 15 MMOLE/5ML IV SOLN
45.0000 mmol | Freq: Once | INTRAVENOUS | Status: DC
  Administered 2023-02-13: 45 mmol via INTRAVENOUS
  Filled 2023-02-13: qty 15

## 2023-02-13 MED ORDER — AMIODARONE IV BOLUS ONLY 150 MG/100ML
150.0000 mg | Freq: Once | INTRAVENOUS | Status: AC
  Administered 2023-02-13: 150 mg via INTRAVENOUS
  Filled 2023-02-13: qty 100

## 2023-02-13 MED ORDER — POTASSIUM CHLORIDE 10 MEQ/50ML IV SOLN
10.0000 meq | INTRAVENOUS | Status: AC
  Administered 2023-02-13 (×6): 10 meq via INTRAVENOUS
  Filled 2023-02-13 (×6): qty 50

## 2023-02-13 MED ORDER — POTASSIUM CL IN DEXTROSE 5% 20 MEQ/L IV SOLN
20.0000 meq | INTRAVENOUS | Status: DC
  Administered 2023-02-13: 20 meq via INTRAVENOUS
  Filled 2023-02-13 (×2): qty 1000

## 2023-02-13 MED ORDER — VITAL 1.5 CAL PO LIQD
1000.0000 mL | ORAL | Status: DC
  Administered 2023-02-13: 1000 mL

## 2023-02-13 MED ORDER — PIPERACILLIN-TAZOBACTAM 3.375 G IVPB
3.3750 g | Freq: Three times a day (TID) | INTRAVENOUS | Status: DC
  Administered 2023-02-13 – 2023-02-14 (×4): 3.375 g via INTRAVENOUS
  Filled 2023-02-13 (×5): qty 50

## 2023-02-13 MED ORDER — INSULIN ASPART 100 UNIT/ML IJ SOLN
0.0000 [IU] | INTRAMUSCULAR | Status: DC
  Administered 2023-02-13 (×3): 2 [IU] via SUBCUTANEOUS
  Administered 2023-02-14 (×3): 1 [IU] via SUBCUTANEOUS
  Filled 2023-02-13 (×3): qty 1

## 2023-02-13 MED ORDER — FREE WATER
200.0000 mL | Status: DC
  Administered 2023-02-13: 200 mL

## 2023-02-13 MED ORDER — IPRATROPIUM-ALBUTEROL 0.5-2.5 (3) MG/3ML IN SOLN
3.0000 mL | Freq: Two times a day (BID) | RESPIRATORY_TRACT | Status: DC
  Administered 2023-02-13 – 2023-02-14 (×2): 3 mL via RESPIRATORY_TRACT
  Filled 2023-02-13 (×2): qty 3

## 2023-02-13 MED ORDER — POTASSIUM CHLORIDE 10 MEQ/50ML IV SOLN
10.0000 meq | INTRAVENOUS | Status: AC
  Administered 2023-02-13 (×5): 10 meq via INTRAVENOUS
  Filled 2023-02-13 (×6): qty 50

## 2023-02-13 NOTE — Progress Notes (Addendum)
DETECTED Final   Candida glabrata NOT DETECTED NOT DETECTED Final   Candida krusei NOT DETECTED NOT DETECTED Final   Candida parapsilosis NOT DETECTED NOT DETECTED Final   Candida tropicalis NOT DETECTED NOT DETECTED Final   Cryptococcus neoformans/gattii NOT DETECTED NOT DETECTED Final   Methicillin resistance mecA/C DETECTED (A) NOT DETECTED Final    Comment: CRITICAL RESULT CALLED TO, READ BACK BY AND VERIFIED WITH: Queen Blossom, PHARMD AT 1052 ON 02/10/23 BY GM Performed at [facility], [address] [place], [place] [address]   MRSA Next Gen by PCR, Nasal     Status: None   Collection Time: 02/18/2023  5:23 PM   Specimen: Nasal Mucosa; Nasal Swab  Result Value Ref Range Status   MRSA by PCR Next Gen NOT DETECTED NOT DETECTED Final    Comment: (NOTE) The GeneXpert MRSA Assay (FDA approved for NASAL specimens only), is one component of a comprehensive MRSA colonization surveillance program. It is not intended to diagnose MRSA infection nor to guide or monitor treatment for MRSA infections. Test performance is not FDA approved in patients less than 6 years old. Performed at [facility], [address] [place], [place] [address]   C Difficile Quick Screen w PCR reflex     Status: None   Collection Time: 02/10/23  2:26 AM   Specimen: STOOL  Result Value Ref Range Status   C Diff antigen NEGATIVE NEGATIVE Final   C Diff toxin NEGATIVE NEGATIVE Final   C Diff interpretation No C. difficile detected.  Final    Comment: Performed  at [facility], [address] [place], [place] [address]  Gastrointestinal Panel by PCR , Stool     Status: None   Collection Time: 02/12/23 11:48 PM   Specimen: Stool  Result Value Ref Range Status   Campylobacter species NOT DETECTED NOT DETECTED Final   Plesimonas shigelloides NOT DETECTED NOT DETECTED Final   Salmonella species NOT DETECTED NOT DETECTED Final   Yersinia enterocolitica NOT DETECTED NOT DETECTED Final   Vibrio species NOT DETECTED NOT DETECTED Final   Vibrio cholerae NOT DETECTED NOT DETECTED Final   Enteroaggregative E coli (EAEC) NOT DETECTED NOT DETECTED Final   Enteropathogenic E coli (EPEC) NOT DETECTED NOT DETECTED Final   Enterotoxigenic E coli (ETEC) NOT DETECTED NOT DETECTED Final   Shiga like toxin producing E coli (STEC) NOT DETECTED NOT DETECTED Final   Shigella/Enteroinvasive E coli (EIEC) NOT DETECTED NOT DETECTED Final   Cryptosporidium NOT DETECTED NOT DETECTED Final   Cyclospora cayetanensis NOT DETECTED NOT DETECTED Final   Entamoeba histolytica NOT DETECTED NOT DETECTED Final   Giardia lamblia NOT DETECTED NOT DETECTED Final   Adenovirus F40/41 NOT DETECTED NOT DETECTED Final   Astrovirus NOT DETECTED NOT DETECTED Final   Norovirus GI/GII NOT DETECTED NOT DETECTED Final   Rotavirus A NOT DETECTED NOT DETECTED Final   Sapovirus (I, II, IV, and V) NOT DETECTED NOT DETECTED Final    Comment: Performed at [facility], [address] [place], [place] [address]    Coagulation Studies: No results for input(s): "LABPROT", "INR" in the last 72 hours.   Urinalysis: No results for input(s): "COLORURINE", "LABSPEC", "PHURINE", "GLUCOSEU", "HGBUR", "BILIRUBINUR", "KETONESUR", "PROTEINUR", "UROBILINOGEN", "NITRITE", "LEUKOCYTESUR" in the last 72 hours.  Invalid input(s): "APPERANCEUR"     Imaging: DG Chest Port 1 View  Result Date: 02/12/2023 CLINICAL DATA:  Respiratory failure. EXAM: PORTABLE CHEST 1 VIEW COMPARISON:   One-view chest x-ray 01/31/2023 FINDINGS: The  [facility]  ROUNDING NOTE   Subjective:   Patient seen and evaluated at bedside in ICU Sedation: Fentanyl stopped this morning  Vent- FiO2 45% with 8 PEEP OG tube-decreased output Pressors: Levo   Urine output Sodium 155  Objective:  Vital signs in last 24 hours:  Temp:  [96.4 F (35.8 C)-100 F (37.8 C)] 96.6 F (35.9 C) (10/21 0800) Pulse Rate:  [62-113] 77 (10/21 0800) Resp:  [14-24] 15 (10/21 0800) BP: (85-156)/(47-74) 133/64 (10/21 0800) SpO2:  [88 %-98 %] 94 % (10/21 0800) FiO2 (%):  [45 %] 45 % (10/21 0800) Weight:  [177.8 kg] 177.8 kg (10/21 0300)  Weight change: 1.043 kg Filed Weights   02/11/23 0500 02/12/23 0434 02/13/23 0300  Weight: (!) 181.4 kg (!) 176.8 kg (!) 177.8 kg    Intake/Output: I/O last 3 completed shifts: In: 3111.2 [I.V.:1149.7; NG/GT:1263.8; IV Piggyback:697.6] Out: 3690 [Urine:3530; Emesis/NG output:160]   Intake/Output this shift:  Total I/O In: 114.5 [I.V.:50.5; NG/GT:64] Out: -   Physical Exam: General: Ill appearing  Head: Normocephalic, atraumatic. Moist oral mucosal membranes  Eyes: Anicteric  Lungs:  Intubated and vent  Heart: Regular rate and rhythm  Abdomen:  Soft, nontender  Extremities:  Trace peripheral edema.  Neurologic: Sedated  Skin: No lesions       Basic Metabolic Panel: Recent Labs  Lab 02/10/23 0438 02/10/23 1158 02/11/23 0530 02/12/23 0412 02/12/23 1240 02/12/23 1525 02/12/23 1647 02/13/23 0316  NA 141 142 145 150*  --   --   --  155*  K 5.8* 4.9 4.0 2.6*  --  2.9*  --  3.0*  CL 99 101 102 107  --   --   --  114*  CO2 25 25 23 27   --   --   --  27  GLUCOSE 142* 152* 188* 167*  --   --   --  214*  BUN 144* 138* 142* 118*  --   --   --  139*  CREATININE 6.78* 6.75* 5.83* 4.62*  --   --   --  3.36*  CALCIUM 8.8* 8.6* 8.4* 8.4*  --   --   --  8.7*  MG 2.5*  --  2.3 2.3 3.0*  --  2.7* 2.9*  PHOS 6.4*  --  6.6*  --  4.4  --  3.6 3.3    Liver Function Tests: Recent Labs   Lab 2023-03-05 1215 02/10/23 0438  AST 148* 541*  ALT 67* 314*  ALKPHOS 46 50  BILITOT 0.5 1.2  PROT 6.7 6.1*  ALBUMIN 2.8* 2.5*   Recent Labs  Lab 03/05/23 1215  LIPASE 31   No results for input(s): "AMMONIA" in the last 168 hours.  CBC: Recent Labs  Lab 05-Mar-2023 1116 03-05-2023 1452 05-Mar-2023 2312 02/10/23 0438 02/10/23 1159 02/11/23 0953 02/12/23 0413  WBC 15.2*   < > 4.3 5.1 6.9 14.3* 19.3*  NEUTROABS 8.2*  --   --   --  5.5 12.2*  --   HGB 12.8*   < > 13.5 12.9* 12.6* 11.8* 10.8*  HCT 41.1   < > 41.0 38.3* 38.9* 35.4* 31.4*  MCV 97.9   < > 93.8 90.8 93.1 90.5 88.5  PLT 204   < > 168 175 173 170 151   < > = values in this interval not displayed.    Cardiac Enzymes: No results for input(s): "CKTOTAL", "CKMB", "CKMBINDEX", "TROPONINI" in the last 168 hours.  BNP: Invalid input(s): "POCBNP"  CBG:  [facility]  ROUNDING NOTE   Subjective:   Patient seen and evaluated at bedside in ICU Sedation: Fentanyl stopped this morning  Vent- FiO2 45% with 8 PEEP OG tube-decreased output Pressors: Levo   Urine output Sodium 155  Objective:  Vital signs in last 24 hours:  Temp:  [96.4 F (35.8 C)-100 F (37.8 C)] 96.6 F (35.9 C) (10/21 0800) Pulse Rate:  [62-113] 77 (10/21 0800) Resp:  [14-24] 15 (10/21 0800) BP: (85-156)/(47-74) 133/64 (10/21 0800) SpO2:  [88 %-98 %] 94 % (10/21 0800) FiO2 (%):  [45 %] 45 % (10/21 0800) Weight:  [177.8 kg] 177.8 kg (10/21 0300)  Weight change: 1.043 kg Filed Weights   02/11/23 0500 02/12/23 0434 02/13/23 0300  Weight: (!) 181.4 kg (!) 176.8 kg (!) 177.8 kg    Intake/Output: I/O last 3 completed shifts: In: 3111.2 [I.V.:1149.7; NG/GT:1263.8; IV Piggyback:697.6] Out: 3690 [Urine:3530; Emesis/NG output:160]   Intake/Output this shift:  Total I/O In: 114.5 [I.V.:50.5; NG/GT:64] Out: -   Physical Exam: General: Ill appearing  Head: Normocephalic, atraumatic. Moist oral mucosal membranes  Eyes: Anicteric  Lungs:  Intubated and vent  Heart: Regular rate and rhythm  Abdomen:  Soft, nontender  Extremities:  Trace peripheral edema.  Neurologic: Sedated  Skin: No lesions       Basic Metabolic Panel: Recent Labs  Lab 02/10/23 0438 02/10/23 1158 02/11/23 0530 02/12/23 0412 02/12/23 1240 02/12/23 1525 02/12/23 1647 02/13/23 0316  NA 141 142 145 150*  --   --   --  155*  K 5.8* 4.9 4.0 2.6*  --  2.9*  --  3.0*  CL 99 101 102 107  --   --   --  114*  CO2 25 25 23 27   --   --   --  27  GLUCOSE 142* 152* 188* 167*  --   --   --  214*  BUN 144* 138* 142* 118*  --   --   --  139*  CREATININE 6.78* 6.75* 5.83* 4.62*  --   --   --  3.36*  CALCIUM 8.8* 8.6* 8.4* 8.4*  --   --   --  8.7*  MG 2.5*  --  2.3 2.3 3.0*  --  2.7* 2.9*  PHOS 6.4*  --  6.6*  --  4.4  --  3.6 3.3    Liver Function Tests: Recent Labs   Lab 2023-03-05 1215 02/10/23 0438  AST 148* 541*  ALT 67* 314*  ALKPHOS 46 50  BILITOT 0.5 1.2  PROT 6.7 6.1*  ALBUMIN 2.8* 2.5*   Recent Labs  Lab 03/05/23 1215  LIPASE 31   No results for input(s): "AMMONIA" in the last 168 hours.  CBC: Recent Labs  Lab 05-Mar-2023 1116 03-05-2023 1452 05-Mar-2023 2312 02/10/23 0438 02/10/23 1159 02/11/23 0953 02/12/23 0413  WBC 15.2*   < > 4.3 5.1 6.9 14.3* 19.3*  NEUTROABS 8.2*  --   --   --  5.5 12.2*  --   HGB 12.8*   < > 13.5 12.9* 12.6* 11.8* 10.8*  HCT 41.1   < > 41.0 38.3* 38.9* 35.4* 31.4*  MCV 97.9   < > 93.8 90.8 93.1 90.5 88.5  PLT 204   < > 168 175 173 170 151   < > = values in this interval not displayed.    Cardiac Enzymes: No results for input(s): "CKTOTAL", "CKMB", "CKMBINDEX", "TROPONINI" in the last 168 hours.  BNP: Invalid input(s): "POCBNP"  CBG:  DETECTED Final   Candida glabrata NOT DETECTED NOT DETECTED Final   Candida krusei NOT DETECTED NOT DETECTED Final   Candida parapsilosis NOT DETECTED NOT DETECTED Final   Candida tropicalis NOT DETECTED NOT DETECTED Final   Cryptococcus neoformans/gattii NOT DETECTED NOT DETECTED Final   Methicillin resistance mecA/C DETECTED (A) NOT DETECTED Final    Comment: CRITICAL RESULT CALLED TO, READ BACK BY AND VERIFIED WITH: Queen Blossom, PHARMD AT 1052 ON 02/10/23 BY GM Performed at [facility], [address] [place], [place] [address]   MRSA Next Gen by PCR, Nasal     Status: None   Collection Time: 02/18/2023  5:23 PM   Specimen: Nasal Mucosa; Nasal Swab  Result Value Ref Range Status   MRSA by PCR Next Gen NOT DETECTED NOT DETECTED Final    Comment: (NOTE) The GeneXpert MRSA Assay (FDA approved for NASAL specimens only), is one component of a comprehensive MRSA colonization surveillance program. It is not intended to diagnose MRSA infection nor to guide or monitor treatment for MRSA infections. Test performance is not FDA approved in patients less than 6 years old. Performed at [facility], [address] [place], [place] [address]   C Difficile Quick Screen w PCR reflex     Status: None   Collection Time: 02/10/23  2:26 AM   Specimen: STOOL  Result Value Ref Range Status   C Diff antigen NEGATIVE NEGATIVE Final   C Diff toxin NEGATIVE NEGATIVE Final   C Diff interpretation No C. difficile detected.  Final    Comment: Performed  at [facility], [address] [place], [place] [address]  Gastrointestinal Panel by PCR , Stool     Status: None   Collection Time: 02/12/23 11:48 PM   Specimen: Stool  Result Value Ref Range Status   Campylobacter species NOT DETECTED NOT DETECTED Final   Plesimonas shigelloides NOT DETECTED NOT DETECTED Final   Salmonella species NOT DETECTED NOT DETECTED Final   Yersinia enterocolitica NOT DETECTED NOT DETECTED Final   Vibrio species NOT DETECTED NOT DETECTED Final   Vibrio cholerae NOT DETECTED NOT DETECTED Final   Enteroaggregative E coli (EAEC) NOT DETECTED NOT DETECTED Final   Enteropathogenic E coli (EPEC) NOT DETECTED NOT DETECTED Final   Enterotoxigenic E coli (ETEC) NOT DETECTED NOT DETECTED Final   Shiga like toxin producing E coli (STEC) NOT DETECTED NOT DETECTED Final   Shigella/Enteroinvasive E coli (EIEC) NOT DETECTED NOT DETECTED Final   Cryptosporidium NOT DETECTED NOT DETECTED Final   Cyclospora cayetanensis NOT DETECTED NOT DETECTED Final   Entamoeba histolytica NOT DETECTED NOT DETECTED Final   Giardia lamblia NOT DETECTED NOT DETECTED Final   Adenovirus F40/41 NOT DETECTED NOT DETECTED Final   Astrovirus NOT DETECTED NOT DETECTED Final   Norovirus GI/GII NOT DETECTED NOT DETECTED Final   Rotavirus A NOT DETECTED NOT DETECTED Final   Sapovirus (I, II, IV, and V) NOT DETECTED NOT DETECTED Final    Comment: Performed at [facility], [address] [place], [place] [address]    Coagulation Studies: No results for input(s): "LABPROT", "INR" in the last 72 hours.   Urinalysis: No results for input(s): "COLORURINE", "LABSPEC", "PHURINE", "GLUCOSEU", "HGBUR", "BILIRUBINUR", "KETONESUR", "PROTEINUR", "UROBILINOGEN", "NITRITE", "LEUKOCYTESUR" in the last 72 hours.  Invalid input(s): "APPERANCEUR"     Imaging: DG Chest Port 1 View  Result Date: 02/12/2023 CLINICAL DATA:  Respiratory failure. EXAM: PORTABLE CHEST 1 VIEW COMPARISON:   One-view chest x-ray 01/31/2023 FINDINGS: The  DETECTED Final   Candida glabrata NOT DETECTED NOT DETECTED Final   Candida krusei NOT DETECTED NOT DETECTED Final   Candida parapsilosis NOT DETECTED NOT DETECTED Final   Candida tropicalis NOT DETECTED NOT DETECTED Final   Cryptococcus neoformans/gattii NOT DETECTED NOT DETECTED Final   Methicillin resistance mecA/C DETECTED (A) NOT DETECTED Final    Comment: CRITICAL RESULT CALLED TO, READ BACK BY AND VERIFIED WITH: Queen Blossom, PHARMD AT 1052 ON 02/10/23 BY GM Performed at [facility], [address] [place], [place] [address]   MRSA Next Gen by PCR, Nasal     Status: None   Collection Time: 02/18/2023  5:23 PM   Specimen: Nasal Mucosa; Nasal Swab  Result Value Ref Range Status   MRSA by PCR Next Gen NOT DETECTED NOT DETECTED Final    Comment: (NOTE) The GeneXpert MRSA Assay (FDA approved for NASAL specimens only), is one component of a comprehensive MRSA colonization surveillance program. It is not intended to diagnose MRSA infection nor to guide or monitor treatment for MRSA infections. Test performance is not FDA approved in patients less than 6 years old. Performed at [facility], [address] [place], [place] [address]   C Difficile Quick Screen w PCR reflex     Status: None   Collection Time: 02/10/23  2:26 AM   Specimen: STOOL  Result Value Ref Range Status   C Diff antigen NEGATIVE NEGATIVE Final   C Diff toxin NEGATIVE NEGATIVE Final   C Diff interpretation No C. difficile detected.  Final    Comment: Performed  at [facility], [address] [place], [place] [address]  Gastrointestinal Panel by PCR , Stool     Status: None   Collection Time: 02/12/23 11:48 PM   Specimen: Stool  Result Value Ref Range Status   Campylobacter species NOT DETECTED NOT DETECTED Final   Plesimonas shigelloides NOT DETECTED NOT DETECTED Final   Salmonella species NOT DETECTED NOT DETECTED Final   Yersinia enterocolitica NOT DETECTED NOT DETECTED Final   Vibrio species NOT DETECTED NOT DETECTED Final   Vibrio cholerae NOT DETECTED NOT DETECTED Final   Enteroaggregative E coli (EAEC) NOT DETECTED NOT DETECTED Final   Enteropathogenic E coli (EPEC) NOT DETECTED NOT DETECTED Final   Enterotoxigenic E coli (ETEC) NOT DETECTED NOT DETECTED Final   Shiga like toxin producing E coli (STEC) NOT DETECTED NOT DETECTED Final   Shigella/Enteroinvasive E coli (EIEC) NOT DETECTED NOT DETECTED Final   Cryptosporidium NOT DETECTED NOT DETECTED Final   Cyclospora cayetanensis NOT DETECTED NOT DETECTED Final   Entamoeba histolytica NOT DETECTED NOT DETECTED Final   Giardia lamblia NOT DETECTED NOT DETECTED Final   Adenovirus F40/41 NOT DETECTED NOT DETECTED Final   Astrovirus NOT DETECTED NOT DETECTED Final   Norovirus GI/GII NOT DETECTED NOT DETECTED Final   Rotavirus A NOT DETECTED NOT DETECTED Final   Sapovirus (I, II, IV, and V) NOT DETECTED NOT DETECTED Final    Comment: Performed at [facility], [address] [place], [place] [address]    Coagulation Studies: No results for input(s): "LABPROT", "INR" in the last 72 hours.   Urinalysis: No results for input(s): "COLORURINE", "LABSPEC", "PHURINE", "GLUCOSEU", "HGBUR", "BILIRUBINUR", "KETONESUR", "PROTEINUR", "UROBILINOGEN", "NITRITE", "LEUKOCYTESUR" in the last 72 hours.  Invalid input(s): "APPERANCEUR"     Imaging: DG Chest Port 1 View  Result Date: 02/12/2023 CLINICAL DATA:  Respiratory failure. EXAM: PORTABLE CHEST 1 VIEW COMPARISON:   One-view chest x-ray 01/31/2023 FINDINGS: The

## 2023-02-13 NOTE — Significant Event (Signed)
Brief Progress Note    Notified by bedside RN pt tachycardic hr 140's and hypotensive map 50. Arrived at bedside cardiac rhythm vfib with hr 146, pt never lost his pulse.  Administered amiodarone bolus iv 150 mg x1 dose.  Pt converted back to NSR hr 80-90's and bp stable levophed gtt infusing @6  mcg/min. Will continue to monitor and assess pt.   Zada Girt, AGNP  Pulmonary/Critical Care Pager [phone] (please enter 7 digits) PCCM Consult Pager [phone] (please enter 7 digits)

## 2023-02-13 NOTE — Progress Notes (Signed)
Pharmacy Antibiotic Note  Jay Sandoval is a 82 y.o. male admitted on 02/12/23 with sepsis and AKI. PMH includes asthma, chronic kidney disease (non-HD), congestive heart failure, COPD, GERD, glaucoma, hemorrhoids, hyperlipidemia, hypertension, obesity, psoriasis, sleep apnea. Pharmacy has been consulted for Zosyn dosing.  Today, 02/13/2023 Day 5 of antibiotics  SCr improving  WBC up-trending (receiving steroids)  Afebrile   Plan: Increase Zosyn to 3.375 grams Q8H based on renal function Pharmacy will continue to monitor and dose adjust appropriately   Height: 5' 9.02" (175.3 cm) Weight: (!) 177.8 kg (392 lb) IBW/kg (Calculated) : 70.74  Temp (24hrs), Avg:98.5 F (36.9 C), Min:96.4 F (35.8 C), Max:100 F (37.8 C)  Recent Labs  Lab 02/12/23 1116 02-12-23 1215 02-12-23 1332 Feb 12, 2023 1452 2023/02/12 2147 02/12/23 2312 02/10/23 0438 02/10/23 1158 02/10/23 1159 02/11/23 0530 02/11/23 0953 02/12/23 0412 02/12/23 0413 02/13/23 0316  WBC 15.2*  --   --    < >  --  4.3 5.1  --  6.9  --  14.3*  --  19.3*  --   CREATININE  --    < >  --    < >  --   --  6.78* 6.75*  --  5.83*  --  4.62*  --  3.36*  LATICACIDVEN 3.6*  --  3.4*  --  3.2* 2.9* 2.9*  --   --   --   --   --   --   --    < > = values in this interval not displayed.    Estimated Creatinine Clearance: 28.1 mL/min (A) (by C-G formula based on SCr of 3.36 mg/dL (H)).    No Known Allergies  Antimicrobials this admission: Vancomycin 02-12-2023 >> 2023-02-12 Cefepime 12-Feb-2023 >> 02-12-23  Dose adjustments this admission: N/A  Microbiology results: 12-Feb-2023 BCx: 1/4 MRSE - likely contaminant  2023-02-12 C. Diff screen: negative  2023/02/12 MRSA PCR: not detected 10/18 GI panel: negative   Thank you for allowing pharmacy to be a part of this patient's care.  Littie Deeds, PharmD Pharmacy Resident  02/13/2023 8:52 AM

## 2023-02-13 NOTE — Progress Notes (Signed)
Nutrition Follow-up  DOCUMENTATION CODES:   Morbid obesity  INTERVENTION:   Vital 1.5@40ml /hr + ProSource TF 20- Give 60ml 5 times daily via tube  Free water flushes q2 hours   Regimen provides 1840kcal/day, 165g/day protein and 3184ml/day of free water.   MVI daily via tube   Daily weights   NUTRITION DIAGNOSIS:   Inadequate oral intake related to inability to eat as evidenced by NPO status.  GOAL:   Provide needs based on ASPEN/SCCM guidelines -tfs on hold   MONITOR:   Vent status, Labs, Weight trends, TF tolerance, I & O's, Skin  ASSESSMENT:   82 y/o male with h/o DDD, CHF, COPD, GERD, HLD, OSA, HTN, morbid obesity and chronic pain who is admitted with acute hypoxic respiratory failure, cardiogenic shock, hypovolemic shock, severe metabolic encephalopathy and acute renal failure.  Pt remains sedated and ventilated. OGT in place. Tube feeds on hold for SBTs and possible extubation. Will resume tube feeds if pt does not extubate. Pt with hypernatremia; free water increased today per MD. Per chart, pt is down 9lbs since admission but appears to be around his UBW.   Medications reviewed and include: colace, heparin, insulin, protonix, miralax, 5% @ 24ml/hr, levophed, zosyn  Labs reviewed: Na 155(H), K 3.0(L), BUN 139(H), creat 3.36(H), P 3.3 wnl, Mg 2.9(H) Wbc- 19.0(H) Cbgs- 190, 210, 201, 184 x 24 hrs   Patient is currently intubated on ventilator support MV: 11.2 L/min Temp (24hrs), Avg:97.9 F (36.6 C), Min:95 F (35 C), Max:100 F (37.8 C)  Propofol: none   MAP- >51mmHg   UOP-  Diet Order:   Diet Order             Diet NPO time specified  Diet effective now                  EDUCATION NEEDS:   No education needs have been identified at this time  Skin:  Skin Assessment: Reviewed RN Assessment  Last BM:  10/21- type 6  Height:   Ht Readings from Last 1 Encounters:  02-12-2023 5' 9.02" (1.753 m)    Weight:   Wt Readings  from Last 1 Encounters:  02/13/23 (!) 177.8 kg    Ideal Body Weight:  72.7 kg  BMI:  Body mass index is 57.86 kg/m.  Estimated Nutritional Needs:   Kcal:  1600-1800kcal/day  Protein:  160-180g/day  Fluid:  1.9-2.2L/day  Betsey Holiday MS, RD, LDN Please refer to [facility] for RD and/or RD on-call/weekend/after hours pager

## 2023-02-13 NOTE — Plan of Care (Signed)

## 2023-02-13 NOTE — Inpatient Diabetes Management (Signed)
Inpatient Diabetes Program Recommendations  AACE/ADA: New Consensus Statement on Inpatient Glycemic Control  Target Ranges:  Prepandial:   less than 140 mg/dL      Peak postprandial:   less than 180 mg/dL (1-2 hours)      Critically ill patients:  140 - 180 mg/dL    Latest Reference Range & Units 02/13/23 03:15 02/13/23 07:29  Glucose-Capillary 70 - 99 mg/dL 161 (H) 096 (H)    Review of Glycemic Control  Diabetes history: No Outpatient Diabetes medications: NA Current orders for Inpatient glycemic control: None; Solucortef 50 mg Q6H and Vital @ 50 ml/hr  Inpatient Diabetes Program Recommendations:    Insulin: If steroids and tube feeding is continued, please consider ordering CBGs Q4H with Novolog 0-9 units Q4H.  Thanks, Orlando Penner, RN, MSN, CDCES Diabetes Coordinator Inpatient Diabetes Program [phone] (Team Pager from 8am to 5pm)

## 2023-02-13 NOTE — Progress Notes (Signed)
Brief Progress Note:  Called to the bedside due to air leak around the ETT, requiring addition of air continuously over the course of the day. We initiated our assessment by administering paralysis as well as etomidate for sedation. Once patient was relaxed, we proceeded to assess the ETT. The cuff and cuff tubing were examined, noted to be intact. Air added to the balloon, ETT pushed in, with leak improved. I then used a slim disposable flexible bronchoscope to evaluate the ETT. I passed the bronchoscope through the mouth to the epiglottis. Secretions suctioned from the airway. I was then able to visualize the balloon and noted it to be inflated at the level of the cords. We deflated the balloon and pushed the ETT forward to below the cords, then re-inflated the balloon. This resulted in resolution of the leak. CXR was ordered.  Raechel Chute, MD [facility] 02/13/2023 7:01 PM

## 2023-02-13 NOTE — Progress Notes (Signed)
NAME:  Jay Sandoval, MRN:  [id number], DOB:  12-23-1940, LOS: 4 ADMISSION DATE:  02/01/2023,  CHIEF COMPLAINT:  Altered Mental Status    History of Present Illness:  82 year old male brought to the ED for altered mental status with complaint of diarrhea x 2 days. The patient was discharged 2 weeks ago for pneumonia and went home on PO medication. The patient is currently bed bound and family member reports that the patient is minimally responsive. The patient was brought in my EMS who report that O2 sat was 80% on room air.   The patient was put on BiPAP in the ED and consulted ICU team Patient seen on biPAP, severe resp distress, severe encephalopathy   ER Course CODE SEPSIS CALLED Give IVF's Give ABX Placed on biPAP Placed a foley Obtained Cx's ARF-Bun 150 K=7.4 WBC 15 LA 3.4 VBG 7.18/6  Pertinent  Medical History  Arthritis  Asthma  HTN  Osteoporosis  OSA   Significant Hospital Events: Including procedures, antibiotic start and stop dates in addition to other pertinent events   10/17 Patient presents to the ED with acute respiratory distress 10/18 severe multiorgan failure 10/19: remains on pressors, remains critically ill 10/21: Pt no longer requiring vasopressors.  He remains mechanically intubated now on        minimal vents settings.  Performing WUA pt unable to follow commands.  Mentation        precluding extubation   Interim History / Subjective:  As outlined above   Objective   Blood pressure (!) 103/56, pulse 79, temperature (!) 96.3 F (35.7 C), temperature source Esophageal, resp. rate 18, height 5' 9.02" (1.753 m), weight (!) 177.8 kg, SpO2 95%. CVP:  [2 mmHg-26 mmHg] 2 mmHg  Vent Mode: PSV FiO2 (%):  [45 %] 45 % Set Rate:  [16 bmp] 16 bmp Vt Set:  [550 mL] 550 mL PEEP:  [8 cmH20] 8 cmH20 Pressure Support:  [5 cmH20] 5 cmH20 Plateau Pressure:  [20 cmH20-23 cmH20] 22 cmH20   Intake/Output Summary (Last 24 hours) at 02/13/2023 1238 Last data filed  at 02/13/2023 1100 Gross per 24 hour  Intake 2533.2 ml  Output 3030 ml  Net -496.8 ml   Filed Weights   02/11/23 0500 02/12/23 0434 02/13/23 0300  Weight: (!) 181.4 kg (!) 176.8 kg (!) 177.8 kg    Examination: General: Acute on chronically ill appearing obese male, NAD mechanically intubated  HENT: Supple, difficult to assess for JVD due to body habitus  Lungs: Rhonchi throughout, even non labored  Cardiovascular: NSR, s1s2, no m/r/g, 2+ radial/1+ distal pulses, trace generalized edema  Abdomen: +BS x4, obese, soft, non distended  Extremities: Normal bulk and tone  Neuro: Sedated, not following commands, withdraws from painful stimulation, PERRL GU: Indwelling foley catheter draining yellow urine   Resolved Hospital Problem list     Assessment & Plan:   #Acute encephalopathy secondary to metabolic derangements and CO2 narcosis  #Mechanical ventilation pain/discomfort  - Correct metabolic derangements  - Avoid sedating medications as able  - WUA daily  - Maintain RASS goal of 0 to -1 - PAD protocol to maintain RASS goal: Prn propofol gtt and fentanyl   #Acute on chronic hypoxic hypercapnic respiratory failure  #OSA/OHS  #COPD  #Mechanical ventilation  Hx: Asthma  - Full vent support for now: vent settings reviewed and established  - Maintain O2 sats 92% or higher  - Continue lung protective strategies  - Maintain plateau pressures less than 30  cm H2O - Nebulized steroids  - Scheduled bronchodilator therapy  - SBT once all parameters met  - Intermittent CXR and ABG's   #Hypotension secondary to sepsis and hypovolemia~improving  #Diastolic CHF  Hx: HTN, morbid obesity, and HLD  EF 02/10/2023: EF 60 to 65% - Continuous telemetry monitoring  - Gentle iv fluid resuscitation and/or prn levophed/vasopressin gtts to maintain map 65 or higher  - Hold outpatient antihypertensive and diuretics for now  - Diurese as renal function and blood pressure permits   #Acute kidney  injury secondary to ATN  #Hyperkalemia~resolved  #Severe metabolic acidosis~resolved  #Hyponatremia #Hypokalemia  - Trend BMP  - Replace electrolytes as indicated - Strict I's&O's - Avoid nephrotoxic medications  - Increased free water flushes to 200 ml q2hrs  - D5W @40  ml/hr   #Sepsis secondary suspected pneumonia  - Trend WBC and monitor fever curve  - Trend PCT - Follow cultures - Continue zosyn pending culture results and sensitivities   #Hypothyroidism  - Continue outpatient synthroid   #Type II diabetes mellitus  - CBG's q4hrs  - SSI  - Target range 140 to 180  Best Practice (right click and "Reselect all SmartList Selections" daily)   Diet/type: TF's DVT prophylaxis: prophylactic heparin  GI prophylaxis: PPI Lines: Central line Foley:  Yes, and it is still needed Code Status: Limited Code  Last date of multidisciplinary goals of care discussion [02/13/2023]  10/21: Updated pts niece Esaw Grandchild via telephone regarding pts condition and plan of care.   Labs   CBC: Recent Labs  Lab 02/11/2023 1116 02/21/2023 1452 02/10/23 0438 02/10/23 1159 02/11/23 0953 02/12/23 0413 02/13/23 1113  WBC 15.2*   < > 5.1 6.9 14.3* 19.3* 19.0*  NEUTROABS 8.2*  --   --  5.5 12.2*  --  PENDING  HGB 12.8*   < > 12.9* 12.6* 11.8* 10.8* 11.0*  HCT 41.1   < > 38.3* 38.9* 35.4* 31.4* 33.5*  MCV 97.9   < > 90.8 93.1 90.5 88.5 91.8  PLT 204   < > 175 173 170 151 149*   < > = values in this interval not displayed.    Basic Metabolic Panel: Recent Labs  Lab 02/10/23 0438 02/10/23 1158 02/11/23 0530 02/12/23 0412 02/12/23 1240 02/12/23 1525 02/12/23 1647 02/13/23 0316  NA 141 142 145 150*  --   --   --  155*  K 5.8* 4.9 4.0 2.6*  --  2.9*  --  3.0*  CL 99 101 102 107  --   --   --  114*  CO2 25 25 23 27   --   --   --  27  GLUCOSE 142* 152* 188* 167*  --   --   --  214*  BUN 144* 138* 142* 118*  --   --   --  139*  CREATININE 6.78* 6.75* 5.83* 4.62*  --   --   --  3.36*   CALCIUM 8.8* 8.6* 8.4* 8.4*  --   --   --  8.7*  MG 2.5*  --  2.3 2.3 3.0*  --  2.7* 2.9*  PHOS 6.4*  --  6.6*  --  4.4  --  3.6 3.3   GFR: Estimated Creatinine Clearance: 28.1 mL/min (A) (by C-G formula based on SCr of 3.36 mg/dL (H)). Recent Labs  Lab 01/28/2023 1332 02/23/2023 1452 01/28/2023 2147 01/24/2023 2312 02/10/23 0438 02/10/23 1159 02/11/23 0953 02/12/23 0413 02/13/23 0316 02/13/23 1113  PROCALCITON  --   --   --   --   --   --   --   --  5.63  --   WBC  --    < >  --  4.3 5.1 6.9 14.3* 19.3*  --  19.0*  LATICACIDVEN 3.4*  --  3.2* 2.9* 2.9*  --   --   --   --   --    < > = values in this interval not displayed.    Liver Function Tests: Recent Labs  Lab 02/05/2023 1215 02/10/23 0438 02/13/23 0316  AST 148* 541* 67*  ALT 67* 314* 139*  ALKPHOS 46 50 83  BILITOT 0.5 1.2 1.1  PROT 6.7 6.1* 5.6*  ALBUMIN 2.8* 2.5* 2.0*   Recent Labs  Lab 02/07/2023 1215  LIPASE 31   No results for input(s): "AMMONIA" in the last 168 hours.  ABG    Component Value Date/Time   PHART 7.35 02/10/2023 1029   PCO2ART 43 02/10/2023 1029   PO2ART 75 (L) 02/10/2023 1029   HCO3 23.7 02/10/2023 1029   ACIDBASEDEF 2.0 02/10/2023 1029   O2SAT 96.5 02/10/2023 1029     Coagulation Profile: Recent Labs  Lab 02/16/2023 1116  INR 1.2    Cardiac Enzymes: No results for input(s): "CKTOTAL", "CKMB", "CKMBINDEX", "TROPONINI" in the last 168 hours.  HbA1C: No results found for: "HGBA1C"  CBG: Recent Labs  Lab 02/12/23 2340 02/13/23 0315 02/13/23 0729 02/13/23 0854 02/13/23 1119  GLUCAP 186* 184* 201* 210* 190*    Review of Systems:   Unable to assess pt mechanically intubated   Past Medical History:  He,  has a past medical history of Arthritis, Asthma, Hypertension, Osteoporosis, and Sleep apnea.   Surgical History:   Past Surgical History:  Procedure Laterality Date   COLONOSCOPY N/A 07/10/2020   Procedure: COLONOSCOPY;  Surgeon: Pasty Spillers, MD;  Location:  [facility] ENDOSCOPY;  Service: Endoscopy;  Laterality: N/A;   KNEE ARTHROPLASTY     SHOULDER ARTHROSCOPY DISTAL CLAVICLE EXCISION AND OPEN ROTATOR CUFF REPAIR       Social History:   reports that he quit smoking about 7 years ago. His smoking use included cigarettes. He has never used smokeless tobacco. He reports that he does not drink alcohol and does not use drugs.   Family History:  His family history is not on file.   Allergies No Known Allergies   Home Medications  Prior to Admission medications   Medication Sig Start Date End Date Taking? Authorizing Provider  fluticasone-salmeterol (ADVAIR) 250-50 MCG/ACT AEPB Inhale 1 puff into the lungs 2 (two) times daily. 01/27/23  Yes [provider]  Potassium Chloride ER 20 MEQ TBCR Take 1 tablet by mouth daily. 01/27/23  Yes [provider]  tiZANidine (ZANAFLEX) 4 MG tablet Take 4 mg by mouth 2 (two) times daily as needed. 02/03/23  Yes [provider]  albuterol (PROVENTIL) (2.5 MG/3ML) 0.083% nebulizer solution Take 2.5 mg by nebulization every 6 (six) hours as needed. 03/04/16   [provider]  albuterol (VENTOLIN HFA) 108 (90 Base) MCG/ACT inhaler Inhale 2 puffs into the lungs every 6 (six) hours as needed for wheezing or shortness of breath.    [provider]  allopurinol (ZYLOPRIM) 100 MG tablet Take 200 mg by mouth daily. 05/27/14   [provider]  aspirin EC 81 MG tablet Take 81 mg by mouth daily. Swallow whole.    [provider]  atenolol (TENORMIN) 50 MG tablet Take 50 mg by mouth daily. 02/15/16   [provider]  atorvastatin (LIPITOR) 10 MG tablet Take 1 tablet (10 mg total)  by mouth daily. 01/11/23   Leeroy Bock, MD  benazepril (LOTENSIN) 40 MG tablet Take 40 mg by mouth daily.    [provider]  Coenzyme Q10 100 MG capsule Take 100 mg by mouth 2 (two) times daily.    [provider]  DULoxetine (CYMBALTA) 60 MG capsule Take 60 mg by  mouth daily.    [provider]  fluticasone (FLONASE) 50 MCG/ACT nasal spray Place 2 sprays into both nostrils daily as needed.    [provider]  levothyroxine (SYNTHROID) 25 MCG tablet Take 25 mcg by mouth daily before breakfast. 10/13/18   [provider]  meloxicam (MOBIC) 7.5 MG tablet Take 1 tablet (7.5 mg total) by mouth daily as needed for pain. 01/10/23   Leeroy Bock, MD  metolazone (ZAROXOLYN) 2.5 MG tablet Take 2.5 mg by mouth every 7 (seven) days.    [provider]  Multiple Vitamins-Minerals (MULTIVITAL PO) Take 1 tablet by mouth daily.    [provider]  omega-3 acid ethyl esters (LOVAZA) 1 g capsule Take 2 capsules by mouth 2 (two) times daily.    [provider]  potassium chloride SA (K-DUR) 20 MEQ tablet Take 20 mEq by mouth daily. 09/06/18   [provider]  pregabalin (LYRICA) 100 MG capsule Take 1 capsule (100 mg total) by mouth 2 (two) times daily. 01/10/23 02/23/2023  Leeroy Bock, MD  RESTASIS 0.05 % ophthalmic emulsion Place 1 drop into both eyes 2 (two) times daily. 02/25/16   [provider]  SYMBICORT 160-4.5 MCG/ACT inhaler Inhale 2 puffs into the lungs 2 (two) times daily.    [provider]  torsemide 40 MG TABS Take 40 mg by mouth daily. 01/10/23   Leeroy Bock, MD  WEGOVY 0.5 MG/0.5ML SOAJ Inject 0.5 mg into the skin once a week. Saturday    [provider]     Critical care time: 50 minutes     Zada Girt, AGNP  Pulmonary/Critical Care Pager [phone] (please enter 7 digits) PCCM Consult Pager [phone] (please enter 7 digits)

## 2023-02-13 NOTE — TOC Initial Note (Signed)
Transition of Care [facility]) - Initial/Assessment Note    Patient Details  Name: Jay Sandoval MRN: [id number] Date of Birth: [birth year]  Transition of [facility] CM/SW Contact:    Chapman Fitch, RN Phone Number: 02/13/2023, 8:59 AM  Clinical Narrative:                    Patient admitted from home. Recently discharged from [facility]  Patient remains intubated and sedated; requiring vasopressor support      Patient Goals and CMS Choice            Expected Discharge Plan and Services                                              Prior Living Arrangements/Services                       Activities of Daily Living   ADL Screening (condition at time of admission) Independently performs ADLs?: No Does the patient have a NEW difficulty with bathing/dressing/toileting/self-feeding that is expected to last >3 days?: Yes (Initiates electronic notice to provider for possible OT consult) (had caregivers per report) Does the patient have a NEW difficulty with getting in/out of bed, walking, or climbing stairs that is expected to last >3 days?: Yes (Initiates electronic notice to provider for possible PT consult) (had caregivers per report) Does the patient have a NEW difficulty with communication that is expected to last >3 days?: No Is the patient deaf or have difficulty hearing?: No (UTA) Does the patient have difficulty seeing, even when wearing glasses/contacts?: No (UTA) Does the patient have difficulty concentrating, remembering, or making decisions?: No (UTA)  Permission Sought/Granted                  Emotional Assessment              Admission diagnosis:  Upper GI bleeding [K92.2] Acute respiratory failure with hypoxia (HCC) [J96.01] Acute renal failure, unspecified acute renal failure type (HCC) [N17.9] Patient Active Problem List   Diagnosis Date Noted   Acute respiratory failure with hypoxia (HCC) 02/11/2023    Physical deconditioning 01/07/2023   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   Ambulatory dysfunction 11/12/2020   Rectal bleeding    Polyp of colon    Lower GI bleed 07/09/2020   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   Polyarthralgia 11/12/2018   Primary osteoarthritis involving multiple joints 11/12/2018   Chronic respiratory failure with hypoxia (HCC) 04/13/2018   Asthma without status asthmaticus 09/07/2017   Chronic lower back pain 09/07/2017   COPD (chronic obstructive pulmonary disease) (HCC) 09/07/2017   GERD (gastroesophageal reflux disease) 09/07/2017   Hyperlipidemia, unspecified 09/07/2017   Psoriasis 09/07/2017   Sleep apnea 09/07/2017   Hypertension 09/07/2017   Chronic pain of both knees (Primary Area of Pain)(R>L) 09/07/2017   Chronic pain of both shoulders  (Secondary Area of Pain)(R>L) 09/07/2017   Chronic bilateral low back pain without sciatica (Tertiary Area of Pain)(R>L) 09/07/2017   Wrist pain, chronic, left (Fourth Area of Pain) 09/07/2017   Chronic hand pain, left 09/07/2017   Chronic pain syndrome 09/07/2017   Long term current use of opiate analgesic 09/07/2017   Pharmacologic therapy 09/07/2017   Disorder of skeletal system 09/07/2017   Problems influencing health status 09/07/2017   Morbid  obesity (HCC) 03/22/2016   Swelling of limb 03/22/2016   Lymphedema 03/22/2016   Preop cardiovascular exam 07/07/2015   Chest pain with high risk for cardiac etiology 06/23/2015   SOB (shortness of breath) on exertion 06/23/2015   Dependence on continuous positive airway pressure ventilation 06/17/2015   Congestive heart failure (HCC) 03/11/2015   Venous stasis 03/11/2015   Asthma 03/11/2015   Benign hypertension 03/11/2015   Hypercholesterolemia 03/11/2015   Obstructive sleep apnea syndrome 03/11/2015   Back muscle spasm 02/26/2015   DDD (degenerative disc disease), lumbar 10/20/2014   Osteoarthritis of knees, bilateral 11/19/2013   PCP:   Barbette Reichmann, MD Pharmacy:   [facility] [address] ([address]),  - [address] [place] ([place]) [place] [address] Phone: [phone] Fax: [phone]  [facility] [address], [place] - [address] [place] [address] Phone: [phone] Fax: [phone]     Social Determinants of Health (SDOH) Social History: SDOH Screenings   Food Insecurity: Patient Unable To Answer (02/11/2023)  Housing: Patient Unable To Answer (02/11/2023)  Transportation Needs: Patient Unable To Answer (02/11/2023)  Utilities: Patient Unable To Answer (02/11/2023)  Financial Resource Strain: Low Risk  (12/20/2022)   Received from [facility]  Social Connections: Unknown (09/07/2021)   Received from [facility]  Tobacco Use: Medium Risk (02/11/2023)   SDOH Interventions:     Readmission Risk Interventions     No data to display

## 2023-02-13 NOTE — Progress Notes (Signed)
Pt tachycardic HR 148's sustaining, BP 70's. Annabelle Harman NP called to bedside to assess pt.

## 2023-02-13 NOTE — Consult Note (Addendum)
PHARMACY CONSULT NOTE - ELECTROLYTES  Pharmacy Consult for Electrolyte Monitoring and Replacement   Recent Labs: Potassium (mmol/L)  Date Value  02/13/2023 2.5 (LL)   Magnesium (mg/dL)  Date Value  91/47/8295 2.8 (H)   Calcium (mg/dL)  Date Value  62/13/0865 8.7 (L)   Albumin (g/dL)  Date Value  78/46/9629 2.0 (L)  09/07/2017 4.5   Phosphorus (mg/dL)  Date Value  52/84/1324 2.0 (L)   Sodium (mmol/L)  Date Value  02/13/2023 155 (H)  09/07/2017 143   Height: 5' 9.02" (175.3 cm) Weight: (!) 177.8 kg (392 lb) IBW/kg (Calculated) : 70.74 Estimated Creatinine Clearance: 28.1 mL/min (A) (by C-G formula based on SCr of 3.36 mg/dL (H)).  Assessment  Jay Sandoval is a 82 y.o. male presenting with altered mental status and diarrhea x several days. PMH significant for COPD / asthma, CHF, HTN, GERD, OSA. Pharmacy has been consulted to monitor and replace electrolytes.  Diet: NPO MIVF: N/A, intermittent fluid boluses Pertinent medications: N/A  Goal of Therapy: Electrolytes within normal limits  Plan:  K+ 3.0; will order additional Kcl 10 mEq IV q1h x 6 for this morning  Will recheck K+ after run is finished   Addendum:  K+ now 2.5 after aggressive supplementation  KCl 10 mEq IV q1h x 6 and KCl 40 mEq TID Potassium phosphate 45 mmol x 1  Will also add potassium to IV fluids to provide further supplementation  Check all electrolytes with AM labs   Thank you for allowing pharmacy to be a part of this patient's care.  Littie Deeds, PharmD Pharmacy Resident  02/13/2023 7:42 AM

## 2023-02-14 ENCOUNTER — Inpatient Hospital Stay: Payer: 59

## 2023-02-14 DIAGNOSIS — J151 Pneumonia due to Pseudomonas: Secondary | ICD-10-CM

## 2023-02-14 DIAGNOSIS — R579 Shock, unspecified: Secondary | ICD-10-CM | POA: Insufficient documentation

## 2023-02-14 DIAGNOSIS — N179 Acute kidney failure, unspecified: Secondary | ICD-10-CM | POA: Diagnosis not present

## 2023-02-14 DIAGNOSIS — A4152 Sepsis due to Pseudomonas: Secondary | ICD-10-CM | POA: Diagnosis not present

## 2023-02-14 DIAGNOSIS — J9601 Acute respiratory failure with hypoxia: Secondary | ICD-10-CM | POA: Diagnosis not present

## 2023-02-14 DIAGNOSIS — R6521 Severe sepsis with septic shock: Secondary | ICD-10-CM

## 2023-02-14 DIAGNOSIS — G9340 Encephalopathy, unspecified: Secondary | ICD-10-CM | POA: Insufficient documentation

## 2023-02-14 DIAGNOSIS — Z7189 Other specified counseling: Secondary | ICD-10-CM | POA: Diagnosis not present

## 2023-02-14 DIAGNOSIS — R7401 Elevation of levels of liver transaminase levels: Secondary | ICD-10-CM | POA: Insufficient documentation

## 2023-02-14 LAB — BLOOD GAS, ARTERIAL
Acid-Base Excess: 1.7 mmol/L (ref 0.0–2.0)
Bicarbonate: 25.8 mmol/L (ref 20.0–28.0)
FIO2: 60 %
MECHVT: 550 mL
Mechanical Rate: 20
O2 Saturation: 93.3 %
PEEP: 10 cmH2O
Patient temperature: 37
pCO2 arterial: 38 mm[Hg] (ref 32–48)
pH, Arterial: 7.44 (ref 7.35–7.45)
pO2, Arterial: 61 mm[Hg] — ABNORMAL LOW (ref 83–108)

## 2023-02-14 LAB — CBC WITH DIFFERENTIAL/PLATELET
Abs Immature Granulocytes: 0.1 10*3/uL — ABNORMAL HIGH (ref 0.00–0.07)
Abs Immature Granulocytes: 0.14 10*3/uL — ABNORMAL HIGH (ref 0.00–0.07)
Basophils Absolute: 0.1 10*3/uL (ref 0.0–0.1)
Basophils Absolute: 0.1 10*3/uL (ref 0.0–0.1)
Basophils Relative: 1 %
Basophils Relative: 1 %
Eosinophils Absolute: 0.1 10*3/uL (ref 0.0–0.5)
Eosinophils Absolute: 0 10*3/uL (ref 0.0–0.5)
Eosinophils Relative: 0 %
Eosinophils Relative: 0 %
HCT: 36.1 % — ABNORMAL LOW (ref 39.0–52.0)
HCT: 40.9 % (ref 39.0–52.0)
Hemoglobin: 11.3 g/dL — ABNORMAL LOW (ref 13.0–17.0)
Hemoglobin: 12.7 g/dL — ABNORMAL LOW (ref 13.0–17.0)
Immature Granulocytes: 1 %
Immature Granulocytes: 1 %
Lymphocytes Relative: 8 %
Lymphocytes Relative: 8 %
Lymphs Abs: 1 10*3/uL (ref 0.7–4.0)
Lymphs Abs: 1.1 10*3/uL (ref 0.7–4.0)
MCH: 30 pg (ref 26.0–34.0)
MCH: 29.7 pg (ref 26.0–34.0)
MCHC: 31.3 g/dL (ref 30.0–36.0)
MCHC: 31.1 g/dL (ref 30.0–36.0)
MCV: 95.8 fL (ref 80.0–100.0)
MCV: 95.8 fL (ref 80.0–100.0)
Monocytes Absolute: 1.4 10*3/uL — ABNORMAL HIGH (ref 0.1–1.0)
Monocytes Absolute: 0.9 10*3/uL (ref 0.1–1.0)
Monocytes Relative: 11 %
Monocytes Relative: 6 %
Neutro Abs: 10.1 10*3/uL — ABNORMAL HIGH (ref 1.7–7.7)
Neutro Abs: 12.4 10*3/uL — ABNORMAL HIGH (ref 1.7–7.7)
Neutrophils Relative %: 79 %
Neutrophils Relative %: 84 %
Platelets: 140 10*3/uL — ABNORMAL LOW (ref 150–400)
Platelets: 152 10*3/uL (ref 150–400)
RBC: 3.77 MIL/uL — ABNORMAL LOW (ref 4.22–5.81)
RBC: 4.27 MIL/uL (ref 4.22–5.81)
RDW: 16.5 % — ABNORMAL HIGH (ref 11.5–15.5)
RDW: 16.7 % — ABNORMAL HIGH (ref 11.5–15.5)
Smear Review: NORMAL
WBC: 12.7 10*3/uL — ABNORMAL HIGH (ref 4.0–10.5)
WBC: 14.7 10*3/uL — ABNORMAL HIGH (ref 4.0–10.5)
nRBC: 0.6 % — ABNORMAL HIGH (ref 0.0–0.2)
nRBC: 2 % — ABNORMAL HIGH (ref 0.0–0.2)

## 2023-02-14 LAB — BASIC METABOLIC PANEL
Anion gap: 12 (ref 5–15)
BUN: 111 mg/dL — ABNORMAL HIGH (ref 8–23)
CO2: 28 mmol/L (ref 22–32)
Calcium: 8.4 mg/dL — ABNORMAL LOW (ref 8.9–10.3)
Chloride: 114 mmol/L — ABNORMAL HIGH (ref 98–111)
Creatinine, Ser: 3.13 mg/dL — ABNORMAL HIGH (ref 0.61–1.24)
GFR, Estimated: 19 mL/min — ABNORMAL LOW (ref >60)
Glucose, Bld: 157 mg/dL — ABNORMAL HIGH (ref 70–99)
Potassium: 4 mmol/L (ref 3.5–5.1)
Sodium: 154 mmol/L — ABNORMAL HIGH (ref 135–145)

## 2023-02-14 LAB — TROPONIN I (HIGH SENSITIVITY): Troponin I (High Sensitivity): 152 ng/L (ref <18)

## 2023-02-14 LAB — PHOSPHORUS: Phosphorus: 3 mg/dL (ref 2.5–4.6)

## 2023-02-14 LAB — CULTURE, BLOOD (ROUTINE X 2)
Culture: NO GROWTH
Special Requests: ADEQUATE

## 2023-02-14 LAB — GLUCOSE, CAPILLARY
Glucose-Capillary: 133 mg/dL — ABNORMAL HIGH (ref 70–99)
Glucose-Capillary: 135 mg/dL — ABNORMAL HIGH (ref 70–99)
Glucose-Capillary: 95 mg/dL (ref 70–99)

## 2023-02-14 LAB — HEPATIC FUNCTION PANEL
ALT: 95 U/L — ABNORMAL HIGH (ref 0–44)
AST: 34 U/L (ref 15–41)
Albumin: 2.1 g/dL — ABNORMAL LOW (ref 3.5–5.0)
Alkaline Phosphatase: 70 U/L (ref 38–126)
Bilirubin, Direct: 0.3 mg/dL — ABNORMAL HIGH (ref 0.0–0.2)
Indirect Bilirubin: 0.5 mg/dL (ref 0.3–0.9)
Total Bilirubin: 0.8 mg/dL (ref 0.3–1.2)
Total Protein: 5.5 g/dL — ABNORMAL LOW (ref 6.5–8.1)

## 2023-02-14 LAB — TRIGLYCERIDES: Triglycerides: 141 mg/dL (ref <150)

## 2023-02-14 LAB — POTASSIUM: Potassium: 4.2 mmol/L (ref 3.5–5.1)

## 2023-02-14 LAB — MAGNESIUM: Magnesium: 2.7 mg/dL — ABNORMAL HIGH (ref 1.7–2.4)

## 2023-02-14 LAB — LACTIC ACID, PLASMA: Lactic Acid, Venous: 2.2 mmol/L (ref 0.5–1.9)

## 2023-02-14 LAB — PROCALCITONIN: Procalcitonin: 4.59 ng/mL

## 2023-02-14 MED ORDER — SODIUM CHLORIDE 0.9% FLUSH: 10.0000 mL | Freq: Two times a day (BID) | INTRAVENOUS | Status: DC

## 2023-02-14 MED ORDER — LACTATED RINGERS IV BOLUS
1000.0000 mL | Freq: Once | INTRAVENOUS | Status: AC
  Administered 2023-02-14: 1000 mL via INTRAVENOUS

## 2023-02-14 MED ORDER — DEXTROSE 5 % IV SOLN: INTRAVENOUS | Status: DC

## 2023-02-14 MED ORDER — ETOMIDATE 2 MG/ML IV SOLN
20.0000 mg | Freq: Once | INTRAVENOUS | Status: AC
  Administered 2023-02-14: 10 mg via INTRAVENOUS
  Filled 2023-02-14: qty 10

## 2023-02-14 MED ORDER — FREE WATER
300.0000 mL | Status: DC
  Administered 2023-02-14 (×4): 300 mL

## 2023-02-14 MED ORDER — MORPHINE 100MG IN NS 100ML (1MG/ML) PREMIX INFUSION
1.0000 mg/h | INTRAVENOUS | Status: DC
Start: 2023-02-14 — End: 2023-02-15
  Administered 2023-02-14: 4 mg/h via INTRAVENOUS
  Filled 2023-02-14: qty 100

## 2023-02-14 MED ORDER — HYDROCORTISONE SOD SUC (PF) 100 MG IJ SOLR
50.0000 mg | Freq: Three times a day (TID) | INTRAMUSCULAR | Status: DC
  Administered 2023-02-14: 50 mg via INTRAVENOUS
  Filled 2023-02-14: qty 2

## 2023-02-14 MED ORDER — EPINEPHRINE 1 MG/10ML IJ SOSY
PREFILLED_SYRINGE | INTRAMUSCULAR | Status: AC
  Filled 2023-02-14: qty 10

## 2023-02-14 MED ORDER — MORPHINE BOLUS VIA INFUSION
5.0000 mg | INTRAVENOUS | Status: DC | PRN
Start: 2023-02-14 — End: 2023-02-15
  Administered 2023-02-14: 5 mg via INTRAVENOUS

## 2023-02-14 MED ORDER — ROCURONIUM BROMIDE 10 MG/ML (PF) SYRINGE
100.0000 mg | PREFILLED_SYRINGE | Freq: Once | INTRAVENOUS | Status: AC
  Administered 2023-02-14: 100 mg via INTRAVENOUS
  Filled 2023-02-14: qty 10

## 2023-02-14 MED ORDER — LORAZEPAM 2 MG/ML IJ SOLN: 2.0000 mg | INTRAMUSCULAR | Status: DC | PRN

## 2023-02-14 MED ORDER — POLYVINYL ALCOHOL 1.4 % OP SOLN: 1.0000 [drp] | Freq: Four times a day (QID) | OPHTHALMIC | Status: DC | PRN

## 2023-02-14 MED ORDER — GLYCOPYRROLATE 0.2 MG/ML IJ SOLN: 0.2000 mg | INTRAMUSCULAR | Status: DC | PRN

## 2023-02-14 MED ORDER — AMIODARONE IV BOLUS ONLY 150 MG/100ML: 150.0000 mg | Freq: Once | INTRAVENOUS | Status: DC

## 2023-02-15 LAB — GLUCOSE, CAPILLARY
Glucose-Capillary: 137 mg/dL — ABNORMAL HIGH (ref 70–99)
Glucose-Capillary: 144 mg/dL — ABNORMAL HIGH (ref 70–99)

## 2023-02-18 LAB — CULTURE, RESPIRATORY W GRAM STAIN

## 2023-02-24 NOTE — Progress Notes (Addendum)
NAME:  Jay Sandoval, MRN:  [id number], DOB:  [birth year], LOS: 5 ADMISSION DATE:  02/20/2023,  CHIEF COMPLAINT:  Altered Mental Status    History of Present Illness:  82 year old male brought to the ED for altered mental status with complaint of diarrhea x 2 days. The patient was discharged 2 weeks ago for pneumonia and went home on PO medication. The patient is currently bed bound and family member reports that the patient is minimally responsive. The patient was brought in my EMS who report that O2 sat was 80% on room air.   The patient was put on BiPAP in the ED and consulted ICU team Patient seen on biPAP, severe resp distress, severe encephalopathy   ER Course CODE SEPSIS CALLED Give IVF's Give ABX Placed on biPAP Placed a foley Obtained Cx's ARF-Bun 150 K=7.4 WBC 15 LA 3.4 VBG 7.18/6  Pertinent  Medical History  Arthritis  Asthma  HTN  Osteoporosis  OSA   Micro Data:  10/17: BCx: 1/4 MRSE - likely contaminant  10/17: C. Diff screen: negative  10/17: MRSA PCR: not detected 10/18: GI panel: negative  03-08-2023: few wbc present/predominantly pmn, moderate gram positive cocci, moderate gram negative rods, few gram positive rods   Anti-infectives (From admission, onward)    Start     Dose/Rate Route Frequency Ordered Stop   02/13/23 1400  piperacillin-tazobactam (ZOSYN) IVPB 3.375 g        3.375 g 12.5 mL/hr over 240 Minutes Intravenous Every 8 hours 02/13/23 1337     02/17/2023 2100  piperacillin-tazobactam (ZOSYN) IVPB 2.25 g  Status:  Discontinued        2.25 g 100 mL/hr over 30 Minutes Intravenous Every 8 hours 02/06/2023 2001 02/13/23 1337   02/03/2023 1115  vancomycin (VANCOCIN) IVPB 1000 mg/200 mL premix        1,000 mg 200 mL/hr over 60 Minutes Intravenous  Once 02/20/2023 1109 01/29/2023 1321   02/04/2023 1115  ceFEPIme (MAXIPIME) 2 g in sodium chloride 0.9 % 100 mL IVPB        2 g 200 mL/hr over 30 Minutes Intravenous  Once 01/27/2023 1109 02/12/2023 1213       Significant Hospital Events: Including procedures, antibiotic start and stop dates in addition to other pertinent events   10/17 Patient presents to the ED with acute respiratory distress 10/18 severe multiorgan failure 10/19: remains on pressors, remains critically ill 10/21: Pt no longer requiring vasopressors.  He remains mechanically intubated now on        minimal vents settings.  Performing WUA pt unable to follow commands.  Mentation        precluding extubation.  Pts cardiac rhythm briefly changed to vfib with hr 146, pt never lost        his pulse. Administered amiodarone bolus iv 150 mg x1 dose. Pt converted back to NSR hr        80-90's  March 08, 2023: Pt remains mechanically intubated on minimal vent settings.  Currently requiring levophed gtt @4mcg /min.  He is off sedation and not following commands which precludes extubation.    Interim History / Subjective:  As outlined above   Objective   Blood pressure 132/76, pulse 90, temperature 98.1 F (36.7 C), temperature source Axillary, resp. rate (!) 25, height 5' 9.02" (1.753 m), weight (!) 177.8 kg, SpO2 95%.    Vent Mode: PRVC FiO2 (%):  [45 %] 45 % Set Rate:  [16 bmp-20 bmp] 20 bmp Vt Set:  [550  mL] 550 mL PEEP:  [8 cmH20-10 cmH20] 10 cmH20 Pressure Support:  [5 cmH20] 5 cmH20 Plateau Pressure:  [24 cmH20-27 cmH20] 27 cmH20   Intake/Output Summary (Last 24 hours) at 02/10/2023 9562 Last data filed at 02/12/2023 0600 Gross per 24 hour  Intake 2570.22 ml  Output 3200 ml  Net -629.78 ml   Filed Weights   02/12/23 0434 02/13/23 0300 02/05/2023 0500  Weight: (!) 176.8 kg (!) 177.8 kg (!) 177.8 kg    Examination: General: Acute on chronically ill appearing obese male, NAD mechanically intubated  HENT: Supple, difficult to assess for JVD due to body habitus  Lungs: Rhonchi throughout, even non labored  Cardiovascular: NSR, s1s2, no m/r/g, 2+ radial/1+ distal pulses, trace generalized edema  Abdomen: +BS x4, obese, soft,  non distended  Extremities: Normal bulk and tone  Neuro: Sedated, not following commands, facial grimacing to painful stimulation, PERRL GU: Indwelling foley catheter draining yellow urine   Resolved Hospital Problem list     Assessment & Plan:   #Acute encephalopathy secondary to metabolic derangements and CO2 narcosis  #Mechanical ventilation pain/discomfort  - Correct metabolic derangements  - Avoid sedating medications as able  - WUA daily  - Maintain RASS goal of 0 to -1 - PAD protocol to maintain RASS goal: Prn propofol gtt  #Acute on chronic hypoxic hypercapnic respiratory failure  #OSA/OHS  #COPD  #Mechanical ventilation  Hx: Asthma  - Full vent support for now: vent settings reviewed and established  - Maintain O2 sats 92% or higher  - Continue lung protective strategies  - Maintain plateau pressures less than 30 cm H2O - Nebulized steroids  - Scheduled bronchodilator therapy  - SBT once all parameters met  - Intermittent CXR and ABG's   #Hypotension secondary to sepsis and hypovolemia~improving  #Diastolic CHF  Hx: HTN, morbid obesity, and HLD  EF 2023/03/04: EF 60 to 65% - Continuous telemetry monitoring  - Gentle iv fluid resuscitation and/or prn levophed/vasopressin gtts to maintain map 65 or higher  - Hold outpatient antihypertensive and diuretics for now  - Diurese as renal function and blood pressure permits   #Acute kidney injury secondary to ATN  #Hyperkalemia~resolved  #Severe metabolic acidosis~resolved  #Hyponatremia #Hypokalemia  - Trend BMP  - Replace electrolytes as indicated - Strict I's&O's - Avoid nephrotoxic medications  - Increased free water flushes to 300 ml q2hrs  - D5W @40  ml/hr   #Sepsis secondary suspected pneumonia  - Trend WBC and monitor fever curve  - Trend PCT - Follow cultures - Continue zosyn pending culture results and sensitivities   #Transaminitis~improving  [place] Abd Limited 02/13/23: Limited study with overlapping  bowel gas and soft tissue. The gallbladder is not clearly seen. Question intrahepatic biliary air. Portal vein is patent  - Trend hepatic function panel  - Avoid hepatoxic medications - If liver enzymes worsen will obtain CT Abd Pelvis   #Hypothyroidism  - Continue outpatient synthroid   #Type II diabetes mellitus  - CBG's q4hrs  - SSI  - Target range 140 to 180  Best Practice (right click and "Reselect all SmartList Selections" daily)   Diet/type: TF's DVT prophylaxis: prophylactic heparin  GI prophylaxis: PPI Lines: Central line Foley:  Yes, and it is still needed Code Status: Limited Code  Last date of multidisciplinary goals of care discussion [01/28/2023]  10/22: Will update pts niece Esaw Grandchild via telephone regarding pts condition and plan of care.   Labs   CBC: Recent Labs  Lab March 04, 2023 1116 04-Mar-2023  1452 02/10/23 1159 02/11/23 0953 02/12/23 0413 02/13/23 1113 02/20/2023 0437  WBC 15.2*   < > 6.9 14.3* 19.3* 19.0* 12.7*  NEUTROABS 8.2*  --  5.5 12.2*  --  15.1* 10.1*  HGB 12.8*   < > 12.6* 11.8* 10.8* 11.0* 11.3*  HCT 41.1   < > 38.9* 35.4* 31.4* 33.5* 36.1*  MCV 97.9   < > 93.1 90.5 88.5 91.8 95.8  PLT 204   < > 173 170 151 149* 140*   < > = values in this interval not displayed.    Basic Metabolic Panel: Recent Labs  Lab 02/10/23 1158 02/11/23 0530 02/12/23 0412 02/12/23 1240 02/12/23 1525 02/12/23 1647 02/13/23 0316 02/13/23 1424 02/13/23 2134 02/07/2023 0007 02/18/2023 0437  NA 142 145 150*  --   --   --  155*  --   --   --  154*  K 4.9 4.0 2.6*  --    < >  --  3.0* 2.5* 5.3* 4.2 4.0  CL 101 102 107  --   --   --  114*  --   --   --  114*  CO2 25 23 27   --   --   --  27  --   --   --  28  GLUCOSE 152* 188* 167*  --   --   --  214*  --   --   --  157*  BUN 138* 142* 118*  --   --   --  139*  --   --   --  111*  CREATININE 6.75* 5.83* 4.62*  --   --   --  3.36*  --   --   --  3.13*  CALCIUM 8.6* 8.4* 8.4*  --   --   --  8.7*  --   --   --   8.4*  MG  --  2.3 2.3 3.0*  --  2.7* 2.9* 2.8*  --   --  2.7*  PHOS  --  6.6*  --  4.4  --  3.6 3.3 2.0* 4.9*  --  3.0   < > = values in this interval not displayed.   GFR: Estimated Creatinine Clearance: 30.2 mL/min (A) (by C-G formula based on SCr of 3.13 mg/dL (H)). Recent Labs  Lab 02/01/2023 1332 02/23/2023 1452 02/11/2023 2147 01/24/2023 2312 02/10/23 0438 02/10/23 1159 02/11/23 0953 02/12/23 0413 02/13/23 0316 02/13/23 1113 01/31/2023 0437  PROCALCITON  --   --   --   --   --   --   --   --  5.63  --  4.59  WBC  --    < >  --  4.3 5.1   < > 14.3* 19.3*  --  19.0* 12.7*  LATICACIDVEN 3.4*  --  3.2* 2.9* 2.9*  --   --   --   --   --   --    < > = values in this interval not displayed.    Liver Function Tests: Recent Labs  Lab 02/02/2023 1215 02/10/23 0438 02/13/23 0316 02/07/2023 0437  AST 148* 541* 67* 34  ALT 67* 314* 139* 95*  ALKPHOS 46 50 83 70  BILITOT 0.5 1.2 1.1 0.8  PROT 6.7 6.1* 5.6* 5.5*  ALBUMIN 2.8* 2.5* 2.0* 2.1*   Recent Labs  Lab 02/21/2023 1215  LIPASE 31   No results for input(s): "AMMONIA" in the last 168 hours.  ABG    Component Value Date/Time  PHART 7.35 02/10/2023 1029   PCO2ART 43 02/10/2023 1029   PO2ART 75 (L) 02/10/2023 1029   HCO3 23.7 02/10/2023 1029   ACIDBASEDEF 2.0 02/10/2023 1029   O2SAT 96.5 02/10/2023 1029     Coagulation Profile: Recent Labs  Lab 01/29/2023 1116  INR 1.2    Cardiac Enzymes: No results for input(s): "CKTOTAL", "CKMB", "CKMBINDEX", "TROPONINI" in the last 168 hours.  HbA1C: Hgb A1c MFr Bld  Date/Time Value Ref Range Status  02/13/2023 11:12 AM 5.9 (H) 4.8 - 5.6 % Final    Comment:    (NOTE) Pre diabetes:          5.7%-6.4%  Diabetes:              >6.4%  Glycemic control for   <7.0% adults with diabetes     CBG: Recent Labs  Lab 02/13/23 1655 02/13/23 1942 01/27/2023 0004 02/05/2023 0305 02/11/2023 0738  GLUCAP 162* 152* 135* 133* 95    Review of Systems:   Unable to assess pt mechanically  intubated   Past Medical History:  He,  has a past medical history of Arthritis, Asthma, Hypertension, Osteoporosis, and Sleep apnea.   Surgical History:   Past Surgical History:  Procedure Laterality Date   COLONOSCOPY N/A 07/10/2020   Procedure: COLONOSCOPY;  Surgeon: Pasty Spillers, MD;  Location: [facility] ENDOSCOPY;  Service: Endoscopy;  Laterality: N/A;   KNEE ARTHROPLASTY     SHOULDER ARTHROSCOPY DISTAL CLAVICLE EXCISION AND OPEN ROTATOR CUFF REPAIR       Social History:   reports that he quit smoking about 7 years ago. His smoking use included cigarettes. He has never used smokeless tobacco. He reports that he does not drink alcohol and does not use drugs.   Family History:  His family history is not on file.   Allergies No Known Allergies   Home Medications  Prior to Admission medications   Medication Sig Start Date End Date Taking? Authorizing Provider  fluticasone-salmeterol (ADVAIR) 250-50 MCG/ACT AEPB Inhale 1 puff into the lungs 2 (two) times daily. 01/27/23  Yes [provider]  Potassium Chloride ER 20 MEQ TBCR Take 1 tablet by mouth daily. 01/27/23  Yes [provider]  tiZANidine (ZANAFLEX) 4 MG tablet Take 4 mg by mouth 2 (two) times daily as needed. 02/03/23  Yes [provider]  albuterol (PROVENTIL) (2.5 MG/3ML) 0.083% nebulizer solution Take 2.5 mg by nebulization every 6 (six) hours as needed. 03/04/16   [provider]  albuterol (VENTOLIN HFA) 108 (90 Base) MCG/ACT inhaler Inhale 2 puffs into the lungs every 6 (six) hours as needed for wheezing or shortness of breath.    [provider]  allopurinol (ZYLOPRIM) 100 MG tablet Take 200 mg by mouth daily. 05/27/14   [provider]  aspirin EC 81 MG tablet Take 81 mg by mouth daily. Swallow whole.    [provider]  atenolol (TENORMIN) 50 MG tablet Take 50 mg by mouth daily. 02/15/16   [provider]  atorvastatin (LIPITOR) 10 MG tablet Take  1 tablet (10 mg total) by mouth daily. 01/11/23   Leeroy Bock, MD  benazepril (LOTENSIN) 40 MG tablet Take 40 mg by mouth daily.    [provider]  Coenzyme Q10 100 MG capsule Take 100 mg by mouth 2 (two) times daily.    [provider]  DULoxetine (CYMBALTA) 60 MG capsule Take 60 mg by mouth daily.    [provider]  fluticasone (FLONASE) 50 MCG/ACT nasal  spray Place 2 sprays into both nostrils daily as needed.    [provider]  levothyroxine (SYNTHROID) 25 MCG tablet Take 25 mcg by mouth daily before breakfast. 10/13/18   [provider]  meloxicam (MOBIC) 7.5 MG tablet Take 1 tablet (7.5 mg total) by mouth daily as needed for pain. 01/10/23   Leeroy Bock, MD  metolazone (ZAROXOLYN) 2.5 MG tablet Take 2.5 mg by mouth every 7 (seven) days.    [provider]  Multiple Vitamins-Minerals (MULTIVITAL PO) Take 1 tablet by mouth daily.    [provider]  omega-3 acid ethyl esters (LOVAZA) 1 g capsule Take 2 capsules by mouth 2 (two) times daily.    [provider]  potassium chloride SA (K-DUR) 20 MEQ tablet Take 20 mEq by mouth daily. 09/06/18   [provider]  pregabalin (LYRICA) 100 MG capsule Take 1 capsule (100 mg total) by mouth 2 (two) times daily. 01/10/23 Feb 14, 2023  Leeroy Bock, MD  RESTASIS 0.05 % ophthalmic emulsion Place 1 drop into both eyes 2 (two) times daily. 02/25/16   [provider]  SYMBICORT 160-4.5 MCG/ACT inhaler Inhale 2 puffs into the lungs 2 (two) times daily.    [provider]  torsemide 40 MG TABS Take 40 mg by mouth daily. 01/10/23   Leeroy Bock, MD  WEGOVY 0.5 MG/0.5ML SOAJ Inject 0.5 mg into the skin once a week. Saturday    [provider]     Critical care time: 40 minutes     Zada Girt, AGNP  Pulmonary/Critical Care Pager [phone] (please enter 7 digits) PCCM Consult Pager [phone] (please enter 7 digits)

## 2023-02-24 NOTE — IPAL (Signed)
Interdisciplinary Goals of Care Family Meeting   Date carried out: 02/21/2023  Location of the meeting: Bedside  Member's involved: Physician, Nurse Practitioner, and Family Member or next of kin  Durable Power of Attorney or acting medical decision maker: Niece Lauralee Evener   Discussion: We discussed goals of care for Jay Sandoval .  Pt has significantly declined he is currently hypotensive requiring maximal doses of levophed and vasopressin gtt.  He has developed worsening acute hypoxic respiratory failure FiO2 increased to 60%.  Following discussions regarding decline in pts condition and overall poor quality of life.  Pts niece decided to transition pt to Comfort Measures Only when additional family members arrive at bedside.  She is agreeable to start a morphine gtt for pain management now. Will change codes status to DNR to include NO chest compressions/NO ACLS medications/NO Cardioversion if pt cardiac arrest  Code status:   Code Status: Limited: Do not attempt resuscitation (DNR) -DNR-LIMITED -Do Not Intubate/DNI    Disposition: Will transition pt to Comfort Measures Only when family arrive at bedside  Time spent for the meeting: 15 minutes    Zada Girt, AGNP  Pulmonary/Critical Care Pager [phone] (please enter 7 digits) PCCM Consult Pager [phone] (please enter 7 digits)

## 2023-02-24 NOTE — Progress Notes (Signed)
02/17/2023 1700  Spiritual Encounters  Type of Visit Initial  Care provided to: Family  Referral source Nurse (RN/NT/LPN);Family  Reason for visit End-of-life  OnCall Visit Yes  Spiritual Framework  Presenting Themes Rituals and practive  Patient Stress Factors Not reviewed  Family Stress Factors Major life changes  Interventions  Spiritual Care Interventions Made Established relationship of care and support;Compassionate presence;Reflective listening;Prayer  Intervention Outcomes  Outcomes Connection to spiritual care  Spiritual Care Plan  Spiritual Care Issues Still Outstanding No further spiritual care needs at this time (see row info)   Family requested prayer for patient. I pray for the patient and his family member and let them know that we are here if they need spiritual care. More family members will be coming in later today.

## 2023-02-24 NOTE — Consult Note (Signed)
PHARMACY CONSULT NOTE - ELECTROLYTES  Pharmacy Consult for Electrolyte Monitoring and Replacement   Recent Labs: Potassium (mmol/L)  Date Value  02/08/2023 4.0   Magnesium (mg/dL)  Date Value  16/01/9603 2.7 (H)   Calcium (mg/dL)  Date Value  54/12/8117 8.4 (L)   Albumin (g/dL)  Date Value  14/78/2956 2.0 (L)  09/07/2017 4.5   Phosphorus (mg/dL)  Date Value  21/30/8657 3.0   Sodium (mmol/L)  Date Value  01/26/2023 154 (H)  09/07/2017 143   Height: 5' 9.02" (175.3 cm) Weight: (!) 177.8 kg (392 lb) IBW/kg (Calculated) : 70.74 Estimated Creatinine Clearance: 30.2 mL/min (A) (by C-G formula based on SCr of 3.13 mg/dL (H)).  Assessment  Jay Sandoval is a 82 y.o. male presenting with altered mental status and diarrhea x several days. PMH significant for COPD / asthma, CHF, HTN, GERD, OSA. Pharmacy has been consulted to monitor and replace electrolytes.  Diet: NPO MIVF: N/A, intermittent fluid boluses Pertinent medications: N/A  Goal of Therapy: Electrolytes within normal limits  Plan:  No replacement warranted at this time  Check all electrolytes with AM labs   Thank you for allowing pharmacy to be a part of this patient's care.  Littie Deeds, PharmD Pharmacy Resident  02/13/2023 7:33 AM

## 2023-02-24 NOTE — Progress Notes (Signed)
Awaiting for family to arrive to transition pt to comfort care.

## 2023-02-24 NOTE — Progress Notes (Addendum)
NOT DETECTED Final   Sapovirus (I, II, IV, and V) NOT DETECTED NOT DETECTED Final    Comment: Performed at [facility], [address] [place], [place] [address]  Culture, Respiratory w Gram Stain     Status: None (Preliminary result)   Collection Time: 02/13/23  9:31 PM   Specimen: Tracheal Aspirate; Respiratory  Result Value Ref Range Status    Specimen Description   Final    TRACHEAL ASPIRATE Performed at [facility], [address] [place], [place] [address]    Special Requests   Final    NONE Performed at [facility], [address] [place], [place] [address]    Gram Stain   Final    FEW WBC PRESENT, PREDOMINANTLY PMN MODERATE GRAM POSITIVE COCCI MODERATE GRAM NEGATIVE RODS FEW GRAM POSITIVE RODS Performed at [facility], [address] [place], [place] [address]    Culture PENDING  Incomplete   Report Status PENDING  Incomplete    Coagulation Studies: No results for input(s): "LABPROT", "INR" in the last 72 hours.   Urinalysis: No results for input(s): "COLORURINE", "LABSPEC", "PHURINE", "GLUCOSEU", "HGBUR", "BILIRUBINUR", "KETONESUR", "PROTEINUR", "UROBILINOGEN", "NITRITE", "LEUKOCYTESUR" in the last 72 hours.  Invalid input(s): "APPERANCEUR"     Imaging: DG Chest Port 1 View  Result Date: 02/13/2023 CLINICAL DATA:  Endotracheally intubated. EXAM: PORTABLE CHEST 1 VIEW COMPARISON:  Radiograph yesterday FINDINGS: The patient is rotated. The current is difficult to define, the endotracheal tube is similar in position. Enteric tube tip below the diaphragm in the stomach. There is a left internal jugular central line, tip not further delineated on the current exam. Volume loss in the left hemithorax with complete opacification. No focal right lung opacity. Suspected vascular congestion in the right lung. Cardiomediastinal contours are obscured. IMPRESSION: 1. Volume loss in the left hemithorax with complete opacification. Question mucous plugging. 2. Endotracheal tube similar in position, the carina is not defined. Enteric tube tip below the diaphragm in the stomach. 3. Left internal jugular central line, tip not further delineated on the current exam. Electronically Signed   By: Narda Rutherford M.D.   On: 02/13/2023 22:11   US Abdomen Limited RUQ (LIVER/GB)  Result Date:  02/13/2023 CLINICAL DATA:  Elevated liver enzymes EXAM: ULTRASOUND ABDOMEN LIMITED RIGHT UPPER QUADRANT COMPARISON:  CT 12/20/2022 FINDINGS: Gallbladder: Gallbladder is not seen. Common bile duct: Diameter: 3 mm Liver: Heterogeneous liver with some subtle echogenic areas centrally of uncertain etiology. Biliary air is possible. Please correlate with history. There is air in the common duct on the prior examination. Portal vein is patent on color Doppler imaging with normal direction of blood flow towards the liver. Other: Limited by overlapping bowel gas and soft tissue. IMPRESSION: Limited study with overlapping bowel gas and soft tissue. The gallbladder is not clearly seen. Question intrahepatic biliary air. Please correlate for any known history or dedicated workup is recommended such as contrast CT. Electronically Signed   By: Karen Kays M.D.   On: 02/13/2023 15:02     Medications:    dextrose 75 mL/hr at 02/17/2023 1024   norepinephrine (LEVOPHED) Adult infusion 4 mcg/min (02/19/2023 0800)   piperacillin-tazobactam (ZOSYN)  IV 12.5 mL/hr at 02/01/2023 0800   propofol (DIPRIVAN) infusion Stopped (02/13/23 1721)   vasopressin Stopped (02/13/23 0858)    budesonide (PULMICORT) nebulizer solution  0.5 mg Nebulization BID   Chlorhexidine Gluconate Cloth  6 each Topical Daily   docusate  100 mg Per Tube BID  NOT DETECTED Final   Sapovirus (I, II, IV, and V) NOT DETECTED NOT DETECTED Final    Comment: Performed at [facility], [address] [place], [place] [address]  Culture, Respiratory w Gram Stain     Status: None (Preliminary result)   Collection Time: 02/13/23  9:31 PM   Specimen: Tracheal Aspirate; Respiratory  Result Value Ref Range Status    Specimen Description   Final    TRACHEAL ASPIRATE Performed at [facility], [address] [place], [place] [address]    Special Requests   Final    NONE Performed at [facility], [address] [place], [place] [address]    Gram Stain   Final    FEW WBC PRESENT, PREDOMINANTLY PMN MODERATE GRAM POSITIVE COCCI MODERATE GRAM NEGATIVE RODS FEW GRAM POSITIVE RODS Performed at [facility], [address] [place], [place] [address]    Culture PENDING  Incomplete   Report Status PENDING  Incomplete    Coagulation Studies: No results for input(s): "LABPROT", "INR" in the last 72 hours.   Urinalysis: No results for input(s): "COLORURINE", "LABSPEC", "PHURINE", "GLUCOSEU", "HGBUR", "BILIRUBINUR", "KETONESUR", "PROTEINUR", "UROBILINOGEN", "NITRITE", "LEUKOCYTESUR" in the last 72 hours.  Invalid input(s): "APPERANCEUR"     Imaging: DG Chest Port 1 View  Result Date: 02/13/2023 CLINICAL DATA:  Endotracheally intubated. EXAM: PORTABLE CHEST 1 VIEW COMPARISON:  Radiograph yesterday FINDINGS: The patient is rotated. The current is difficult to define, the endotracheal tube is similar in position. Enteric tube tip below the diaphragm in the stomach. There is a left internal jugular central line, tip not further delineated on the current exam. Volume loss in the left hemithorax with complete opacification. No focal right lung opacity. Suspected vascular congestion in the right lung. Cardiomediastinal contours are obscured. IMPRESSION: 1. Volume loss in the left hemithorax with complete opacification. Question mucous plugging. 2. Endotracheal tube similar in position, the carina is not defined. Enteric tube tip below the diaphragm in the stomach. 3. Left internal jugular central line, tip not further delineated on the current exam. Electronically Signed   By: Narda Rutherford M.D.   On: 02/13/2023 22:11   US Abdomen Limited RUQ (LIVER/GB)  Result Date:  02/13/2023 CLINICAL DATA:  Elevated liver enzymes EXAM: ULTRASOUND ABDOMEN LIMITED RIGHT UPPER QUADRANT COMPARISON:  CT 12/20/2022 FINDINGS: Gallbladder: Gallbladder is not seen. Common bile duct: Diameter: 3 mm Liver: Heterogeneous liver with some subtle echogenic areas centrally of uncertain etiology. Biliary air is possible. Please correlate with history. There is air in the common duct on the prior examination. Portal vein is patent on color Doppler imaging with normal direction of blood flow towards the liver. Other: Limited by overlapping bowel gas and soft tissue. IMPRESSION: Limited study with overlapping bowel gas and soft tissue. The gallbladder is not clearly seen. Question intrahepatic biliary air. Please correlate for any known history or dedicated workup is recommended such as contrast CT. Electronically Signed   By: Karen Kays M.D.   On: 02/13/2023 15:02     Medications:    dextrose 75 mL/hr at 02/17/2023 1024   norepinephrine (LEVOPHED) Adult infusion 4 mcg/min (02/19/2023 0800)   piperacillin-tazobactam (ZOSYN)  IV 12.5 mL/hr at 02/01/2023 0800   propofol (DIPRIVAN) infusion Stopped (02/13/23 1721)   vasopressin Stopped (02/13/23 0858)    budesonide (PULMICORT) nebulizer solution  0.5 mg Nebulization BID   Chlorhexidine Gluconate Cloth  6 each Topical Daily   docusate  100 mg Per Tube BID  NOT DETECTED Final   Sapovirus (I, II, IV, and V) NOT DETECTED NOT DETECTED Final    Comment: Performed at [facility], [address] [place], [place] [address]  Culture, Respiratory w Gram Stain     Status: None (Preliminary result)   Collection Time: 02/13/23  9:31 PM   Specimen: Tracheal Aspirate; Respiratory  Result Value Ref Range Status    Specimen Description   Final    TRACHEAL ASPIRATE Performed at [facility], [address] [place], [place] [address]    Special Requests   Final    NONE Performed at [facility], [address] [place], [place] [address]    Gram Stain   Final    FEW WBC PRESENT, PREDOMINANTLY PMN MODERATE GRAM POSITIVE COCCI MODERATE GRAM NEGATIVE RODS FEW GRAM POSITIVE RODS Performed at [facility], [address] [place], [place] [address]    Culture PENDING  Incomplete   Report Status PENDING  Incomplete    Coagulation Studies: No results for input(s): "LABPROT", "INR" in the last 72 hours.   Urinalysis: No results for input(s): "COLORURINE", "LABSPEC", "PHURINE", "GLUCOSEU", "HGBUR", "BILIRUBINUR", "KETONESUR", "PROTEINUR", "UROBILINOGEN", "NITRITE", "LEUKOCYTESUR" in the last 72 hours.  Invalid input(s): "APPERANCEUR"     Imaging: DG Chest Port 1 View  Result Date: 02/13/2023 CLINICAL DATA:  Endotracheally intubated. EXAM: PORTABLE CHEST 1 VIEW COMPARISON:  Radiograph yesterday FINDINGS: The patient is rotated. The current is difficult to define, the endotracheal tube is similar in position. Enteric tube tip below the diaphragm in the stomach. There is a left internal jugular central line, tip not further delineated on the current exam. Volume loss in the left hemithorax with complete opacification. No focal right lung opacity. Suspected vascular congestion in the right lung. Cardiomediastinal contours are obscured. IMPRESSION: 1. Volume loss in the left hemithorax with complete opacification. Question mucous plugging. 2. Endotracheal tube similar in position, the carina is not defined. Enteric tube tip below the diaphragm in the stomach. 3. Left internal jugular central line, tip not further delineated on the current exam. Electronically Signed   By: Narda Rutherford M.D.   On: 02/13/2023 22:11   US Abdomen Limited RUQ (LIVER/GB)  Result Date:  02/13/2023 CLINICAL DATA:  Elevated liver enzymes EXAM: ULTRASOUND ABDOMEN LIMITED RIGHT UPPER QUADRANT COMPARISON:  CT 12/20/2022 FINDINGS: Gallbladder: Gallbladder is not seen. Common bile duct: Diameter: 3 mm Liver: Heterogeneous liver with some subtle echogenic areas centrally of uncertain etiology. Biliary air is possible. Please correlate with history. There is air in the common duct on the prior examination. Portal vein is patent on color Doppler imaging with normal direction of blood flow towards the liver. Other: Limited by overlapping bowel gas and soft tissue. IMPRESSION: Limited study with overlapping bowel gas and soft tissue. The gallbladder is not clearly seen. Question intrahepatic biliary air. Please correlate for any known history or dedicated workup is recommended such as contrast CT. Electronically Signed   By: Karen Kays M.D.   On: 02/13/2023 15:02     Medications:    dextrose 75 mL/hr at 02/17/2023 1024   norepinephrine (LEVOPHED) Adult infusion 4 mcg/min (02/19/2023 0800)   piperacillin-tazobactam (ZOSYN)  IV 12.5 mL/hr at 02/01/2023 0800   propofol (DIPRIVAN) infusion Stopped (02/13/23 1721)   vasopressin Stopped (02/13/23 0858)    budesonide (PULMICORT) nebulizer solution  0.5 mg Nebulization BID   Chlorhexidine Gluconate Cloth  6 each Topical Daily   docusate  100 mg Per Tube BID  [facility]  ROUNDING NOTE   Subjective:   Patient seen and evaluated at bedside in ICU Sedation: Fentanyl  Vent- FiO2 100% with 10 PEEP OG tube Pressors: Levo   Urine output Sodium 154  Objective:  Vital signs in last 24 hours:  Temp:  [83.7 F (28.7 C)-99.4 F (37.4 C)] 99.4 F (37.4 C) (10/22 0800) Pulse Rate:  [54-148] 89 (10/22 0800) Resp:  [16-27] 23 (10/22 0800) BP: (73-136)/(37-83) 114/56 (10/22 0800) SpO2:  [88 %-98 %] 93 % (10/22 0800) FiO2 (%):  [45 %] 45 % (10/22 0800) Weight:  [177.8 kg] 177.8 kg (10/22 0500)  Weight change: 0 kg Filed Weights   02/12/23 0434 02/13/23 0300 01/25/2023 0500  Weight: (!) 176.8 kg (!) 177.8 kg (!) 177.8 kg    Intake/Output: I/O last 3 completed shifts: In: 4017.9 [I.V.:1091.8; NG/GT:1764.5; IV Piggyback:1161.6] Out: 4530 [Urine:2580; Stool:1950]   Intake/Output this shift:  Total I/O In: 165.9 [I.V.:61; NG/GT:80; IV Piggyback:24.9] Out: -   Physical Exam: General: Ill appearing  Head: Normocephalic, atraumatic. Moist oral mucosal membranes  Eyes: Anicteric  Lungs:  Intubated and vent  Heart: Regular rate and rhythm  Abdomen:  Soft, nontender  Extremities:  Trace peripheral edema.  Neurologic: Sedated  Skin: No lesions  GI Flexiseal    Basic Metabolic Panel: Recent Labs  Lab 02/10/23 1158 02/11/23 0530 02/12/23 0412 02/12/23 1240 02/12/23 1525 02/12/23 1647 02/13/23 0316 02/13/23 1424 02/13/23 2134 02/06/2023 0007 02/04/2023 0437  NA 142 145 150*  --   --   --  155*  --   --   --  154*  K 4.9 4.0 2.6*  --    < >  --  3.0* 2.5* 5.3* 4.2 4.0  CL 101 102 107  --   --   --  114*  --   --   --  114*  CO2 25 23 27   --   --   --  27  --   --   --  28  GLUCOSE 152* 188* 167*  --   --   --  214*  --   --   --  157*  BUN 138* 142* 118*  --   --   --  139*  --   --   --  111*  CREATININE 6.75* 5.83* 4.62*  --   --   --  3.36*  --   --   --  3.13*  CALCIUM 8.6* 8.4* 8.4*  --   --   --  8.7*  --    --   --  8.4*  MG  --  2.3 2.3 3.0*  --  2.7* 2.9* 2.8*  --   --  2.7*  PHOS  --  6.6*  --  4.4  --  3.6 3.3 2.0* 4.9*  --  3.0   < > = values in this interval not displayed.    Liver Function Tests: Recent Labs  Lab 03-06-23 1215 02/10/23 0438 02/13/23 0316 02/21/2023 0437  AST 148* 541* 67* 34  ALT 67* 314* 139* 95*  ALKPHOS 46 50 83 70  BILITOT 0.5 1.2 1.1 0.8  PROT 6.7 6.1* 5.6* 5.5*  ALBUMIN 2.8* 2.5* 2.0* 2.1*   Recent Labs  Lab March 06, 2023 1215  LIPASE 31   No results for input(s): "AMMONIA" in the last 168 hours.  CBC: Recent Labs  Lab 2023/03/06 1116 2023-03-06 1452 02/10/23 1159 02/11/23 0953 02/12/23 0413 02/13/23 1113 01/27/2023 0437  WBC  DETECTED Final   Klebsiella aerogenes NOT DETECTED NOT DETECTED Final   Klebsiella oxytoca NOT DETECTED NOT DETECTED Final   Klebsiella pneumoniae NOT DETECTED NOT DETECTED Final   Proteus species NOT DETECTED NOT DETECTED Final   Salmonella species NOT DETECTED NOT DETECTED Final   Serratia marcescens NOT DETECTED NOT DETECTED Final   Haemophilus influenzae NOT DETECTED NOT DETECTED Final   Neisseria meningitidis NOT DETECTED NOT DETECTED Final   Pseudomonas aeruginosa NOT DETECTED NOT DETECTED Final   Stenotrophomonas maltophilia NOT DETECTED NOT DETECTED Final   Candida albicans NOT DETECTED NOT DETECTED Final   Candida auris NOT DETECTED NOT DETECTED Final   Candida glabrata NOT DETECTED NOT DETECTED Final   Candida krusei NOT DETECTED NOT DETECTED Final   Candida parapsilosis NOT DETECTED NOT DETECTED Final   Candida tropicalis NOT DETECTED NOT DETECTED Final   Cryptococcus neoformans/gattii NOT DETECTED NOT DETECTED Final   Methicillin resistance mecA/C DETECTED (A) NOT DETECTED Final    Comment: CRITICAL RESULT CALLED TO, READ BACK BY AND VERIFIED WITH: Queen Blossom, PHARMD AT 1052 ON 02/10/23 BY GM Performed at [facility], [address] [place], [place] [address]   MRSA Next Gen by PCR, Nasal     Status: None   Collection Time: 02/10/2023  5:23 PM   Specimen: Nasal Mucosa; Nasal Swab  Result Value Ref Range Status   MRSA by PCR Next Gen NOT DETECTED NOT DETECTED Final    Comment: (NOTE) The GeneXpert MRSA Assay (FDA approved for NASAL specimens only), is one component of a comprehensive MRSA colonization surveillance program. It is not intended to diagnose MRSA infection nor to guide or monitor treatment for MRSA infections. Test performance is not FDA approved in patients less than 81 years old. Performed at [facility], [address] [place], [place] [address]   C Difficile  Quick Screen w PCR reflex     Status: None   Collection Time: 02/10/23  2:26 AM   Specimen: STOOL  Result Value Ref Range Status   C Diff antigen NEGATIVE NEGATIVE Final   C Diff toxin NEGATIVE NEGATIVE Final   C Diff interpretation No C. difficile detected.  Final    Comment: Performed at [facility], [address] [place], [place] [address]  Gastrointestinal Panel by PCR , Stool     Status: None   Collection Time: 02/12/23 11:48 PM   Specimen: Stool  Result Value Ref Range Status   Campylobacter species NOT DETECTED NOT DETECTED Final   Plesimonas shigelloides NOT DETECTED NOT DETECTED Final   Salmonella species NOT DETECTED NOT DETECTED Final   Yersinia enterocolitica NOT DETECTED NOT DETECTED Final   Vibrio species NOT DETECTED NOT DETECTED Final   Vibrio cholerae NOT DETECTED NOT DETECTED Final   Enteroaggregative E coli (EAEC) NOT DETECTED NOT DETECTED Final   Enteropathogenic E coli (EPEC) NOT DETECTED NOT DETECTED Final   Enterotoxigenic E coli (ETEC) NOT DETECTED NOT DETECTED Final   Shiga like toxin producing E coli (STEC) NOT DETECTED NOT DETECTED Final   Shigella/Enteroinvasive E coli (EIEC) NOT DETECTED NOT DETECTED Final   Cryptosporidium NOT DETECTED NOT DETECTED Final   Cyclospora cayetanensis NOT DETECTED NOT DETECTED Final   Entamoeba histolytica NOT DETECTED NOT DETECTED Final   Giardia lamblia NOT DETECTED NOT DETECTED Final   Adenovirus F40/41 NOT DETECTED NOT DETECTED Final   Astrovirus NOT DETECTED NOT DETECTED Final   Norovirus GI/GII NOT DETECTED NOT DETECTED Final   Rotavirus A NOT DETECTED  NOT DETECTED Final   Sapovirus (I, II, IV, and V) NOT DETECTED NOT DETECTED Final    Comment: Performed at [facility], [address] [place], [place] [address]  Culture, Respiratory w Gram Stain     Status: None (Preliminary result)   Collection Time: 02/13/23  9:31 PM   Specimen: Tracheal Aspirate; Respiratory  Result Value Ref Range Status    Specimen Description   Final    TRACHEAL ASPIRATE Performed at [facility], [address] [place], [place] [address]    Special Requests   Final    NONE Performed at [facility], [address] [place], [place] [address]    Gram Stain   Final    FEW WBC PRESENT, PREDOMINANTLY PMN MODERATE GRAM POSITIVE COCCI MODERATE GRAM NEGATIVE RODS FEW GRAM POSITIVE RODS Performed at [facility], [address] [place], [place] [address]    Culture PENDING  Incomplete   Report Status PENDING  Incomplete    Coagulation Studies: No results for input(s): "LABPROT", "INR" in the last 72 hours.   Urinalysis: No results for input(s): "COLORURINE", "LABSPEC", "PHURINE", "GLUCOSEU", "HGBUR", "BILIRUBINUR", "KETONESUR", "PROTEINUR", "UROBILINOGEN", "NITRITE", "LEUKOCYTESUR" in the last 72 hours.  Invalid input(s): "APPERANCEUR"     Imaging: DG Chest Port 1 View  Result Date: 02/13/2023 CLINICAL DATA:  Endotracheally intubated. EXAM: PORTABLE CHEST 1 VIEW COMPARISON:  Radiograph yesterday FINDINGS: The patient is rotated. The current is difficult to define, the endotracheal tube is similar in position. Enteric tube tip below the diaphragm in the stomach. There is a left internal jugular central line, tip not further delineated on the current exam. Volume loss in the left hemithorax with complete opacification. No focal right lung opacity. Suspected vascular congestion in the right lung. Cardiomediastinal contours are obscured. IMPRESSION: 1. Volume loss in the left hemithorax with complete opacification. Question mucous plugging. 2. Endotracheal tube similar in position, the carina is not defined. Enteric tube tip below the diaphragm in the stomach. 3. Left internal jugular central line, tip not further delineated on the current exam. Electronically Signed   By: Narda Rutherford M.D.   On: 02/13/2023 22:11   US Abdomen Limited RUQ (LIVER/GB)  Result Date:  02/13/2023 CLINICAL DATA:  Elevated liver enzymes EXAM: ULTRASOUND ABDOMEN LIMITED RIGHT UPPER QUADRANT COMPARISON:  CT 12/20/2022 FINDINGS: Gallbladder: Gallbladder is not seen. Common bile duct: Diameter: 3 mm Liver: Heterogeneous liver with some subtle echogenic areas centrally of uncertain etiology. Biliary air is possible. Please correlate with history. There is air in the common duct on the prior examination. Portal vein is patent on color Doppler imaging with normal direction of blood flow towards the liver. Other: Limited by overlapping bowel gas and soft tissue. IMPRESSION: Limited study with overlapping bowel gas and soft tissue. The gallbladder is not clearly seen. Question intrahepatic biliary air. Please correlate for any known history or dedicated workup is recommended such as contrast CT. Electronically Signed   By: Karen Kays M.D.   On: 02/13/2023 15:02     Medications:    dextrose 75 mL/hr at 02/17/2023 1024   norepinephrine (LEVOPHED) Adult infusion 4 mcg/min (02/19/2023 0800)   piperacillin-tazobactam (ZOSYN)  IV 12.5 mL/hr at 02/01/2023 0800   propofol (DIPRIVAN) infusion Stopped (02/13/23 1721)   vasopressin Stopped (02/13/23 0858)    budesonide (PULMICORT) nebulizer solution  0.5 mg Nebulization BID   Chlorhexidine Gluconate Cloth  6 each Topical Daily   docusate  100 mg Per Tube BID

## 2023-02-24 NOTE — Progress Notes (Signed)
Daily Progress Note   Patient Name: Jay Sandoval       Date: 03-02-23 DOB: December 04, 1940  Age: 82 y.o. MRN#: [id number] Attending Physician: Raechel Chute, MD Primary Care Physician: Barbette Reichmann, MD Admit Date: 02/23/2023  Reason for Consultation/Follow-up: Establishing goals of care  Subjective: Notes and labs reviewed.  In to see patient.  He is currently resting in bed on ventilator support.  His paid caregiver is present at bedside.  She states she has been his caregiver for about the past 3 years.  She discusses that at home she cooks, cleans, and helps with ADLs.  She states patient is "very particular" with what he wants and how he wants things done.  She states at times he will get into his [employer] wheelchair and ride it to [place] or to [employer] if he is not happy with what she has to offer.  Caregiver discusses previous rehab admission where patient was very unhappy with the experience, feeling that he did not get the care that he wanted, and did not receive things or care in the amount of time he felt was appropriate.  She states that he would call her from the rehab facility and ask her to bring him things and do things for him.  No family members at bedside at this time.  PMT will follow.  Length of Stay: 5  Current Medications: Scheduled Meds:   EPINEPHrine       budesonide (PULMICORT) nebulizer solution  0.5 mg Nebulization BID   Chlorhexidine Gluconate Cloth  6 each Topical Daily   docusate  100 mg Per Tube BID   feeding supplement (PROSource TF20)  60 mL Per Tube 5 X Daily   feeding supplement (VITAL 1.5 CAL)  1,000 mL Per Tube Q24H   free water  300 mL Per Tube Q2H   heparin injection (subcutaneous)  5,000 Units Subcutaneous Q12H   insulin aspart  0-9 Units  Subcutaneous Q4H   ipratropium-albuterol  3 mL Nebulization BID   multivitamin  15 mL Per Tube Daily   mouth rinse  15 mL Mouth Rinse Q2H   pantoprazole (PROTONIX) IV  40 mg Intravenous Q12H   polyethylene glycol  17 g Per Tube Daily   sodium chloride flush  10 mL Intravenous Q12H   sodium chloride flush  10-40 mL  Intracatheter Q12H   sodium chloride flush  3 mL Intravenous Q12H    Continuous Infusions:  dextrose 100 mL/hr at 02/01/2023 1425   norepinephrine (LEVOPHED) Adult infusion 9 mcg/min (01/26/2023 1333)   piperacillin-tazobactam (ZOSYN)  IV Stopped (02/19/2023 1031)   propofol (DIPRIVAN) infusion Stopped (02/13/23 1721)   vasopressin Stopped (02/13/23 0858)    PRN Meds: EPINEPHrine, acetaminophen, docusate sodium, fentaNYL (SUBLIMAZE) injection, fentaNYL (SUBLIMAZE) injection, loperamide HCl, ondansetron (ZOFRAN) IV, mouth rinse, polyethylene glycol, sodium chloride flush  Physical Exam Constitutional:      Comments: Eyes closed  Pulmonary:     Comments: On ventilator            Vital Signs: BP (!) 98/43 (BP Location: Right Wrist)   Pulse (!) 103   Temp 98.7 F (37.1 C) (Axillary)   Resp (!) 23   Ht 5' 9.02" (1.753 m)   Wt (!) 177.8 kg   SpO2 98%   BMI 57.86 kg/m  SpO2: SpO2: 98 % O2 Device: O2 Device: Ventilator O2 Flow Rate: O2 Flow Rate (L/min): 15 L/min  Intake/output summary:  Intake/Output Summary (Last 24 hours) at 02/19/2023 1444 Last data filed at 02/19/2023 1200 Gross per 24 hour  Intake 2928.3 ml  Output 2800 ml  Net 128.3 ml   LBM: Last BM Date : 02/13/23 Baseline Weight: Weight: (!) 167.9 kg Most recent weight: Weight: (!) 177.8 kg   Patient Active Problem List   Diagnosis Date Noted   Pneumonia of right lung due to Pseudomonas species (HCC) 01/30/2023   Acute renal failure (HCC) 02/13/2023   Sepsis (HCC) 02/13/2023   Acute respiratory failure with hypoxia (HCC) 2023/03/04   Physical deconditioning 01/07/2023   Wheelchair dependence  10/25/2022   Ambulatory dysfunction 11/12/2020   Rectal bleeding    Polyp of colon    Upper GI bleeding 07/09/2020   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   Polyarthralgia 11/12/2018   Primary osteoarthritis involving multiple joints 11/12/2018   Chronic respiratory failure with hypoxia (HCC) 04/13/2018   Asthma without status asthmaticus 09/07/2017   Chronic lower back pain 09/07/2017   COPD (chronic obstructive pulmonary disease) (HCC) 09/07/2017   GERD (gastroesophageal reflux disease) 09/07/2017   Hyperlipidemia, unspecified 09/07/2017   Psoriasis 09/07/2017   Sleep apnea 09/07/2017   Hypertension 09/07/2017   Chronic pain of both knees (Primary Area of Pain)(R>L) 09/07/2017   Chronic pain of both shoulders  (Secondary Area of Pain)(R>L) 09/07/2017   Chronic bilateral low back pain without sciatica (Tertiary Area of Pain)(R>L) 09/07/2017   Wrist pain, chronic, left (Fourth Area of Pain) 09/07/2017   Chronic hand pain, left 09/07/2017   Chronic pain syndrome 09/07/2017   Long term current use of opiate analgesic 09/07/2017   Pharmacologic therapy 09/07/2017   Disorder of skeletal system 09/07/2017   Problems influencing health status 09/07/2017   Morbid obesity (HCC) 03/22/2016   Swelling of limb 03/22/2016   Lymphedema 03/22/2016   Preop cardiovascular exam 07/07/2015   Chest pain with high risk for cardiac etiology 06/23/2015   SOB (shortness of breath) on exertion 06/23/2015   Dependence on continuous positive airway pressure ventilation 06/17/2015   Congestive heart failure (HCC) 03/11/2015   Venous stasis 03/11/2015   Asthma 03/11/2015   Benign hypertension 03/11/2015   Hypercholesterolemia 03/11/2015   Obstructive sleep apnea syndrome 03/11/2015   Back muscle spasm 02/26/2015   DDD (degenerative disc disease), lumbar 10/20/2014   Osteoarthritis of knees, bilateral 11/19/2013    Palliative Care Assessment &  Plan      Recommendations/Plan: PMT will follow.  Code Status:    Code Status Orders  (From admission, onward)           Start     Ordered   02/10/23 1739  Do not attempt resuscitation (DNR) Pre-Arrest Interventions Desired  (Code Status)  Continuous       Question Answer Comment  If pulseless and not breathing No CPR or chest compressions.   In Pre-Arrest Conditions (Patient Has Pulse and Is Breathing) May intubate, use advanced airway interventions and cardioversion/ACLS medications if appropriate or indicated. May transfer to ICU.   Consent: Discussion documented in EHR or advanced directives reviewed      02/10/23 1738           Code Status History     Date Active Date Inactive Code Status Order ID Comments User Context   09-Mar-2023 1353 02/10/2023 1738 Full Code [id number]  Erin Fulling, MD ED   01/04/2023 1540 01/11/2023 0402 Full Code [id number]  Verdene Lennert, MD ED   07/09/2020 1514 07/10/2020 2334 Full Code [id number]  Darlin Drop, DO ED         Thank you for allowing the Palliative Medicine Team to assist in the care of this patient.   Morton Stall, NP  Please contact Palliative Medicine Team phone at [phone] for questions and concerns.

## 2023-02-24 NOTE — Progress Notes (Signed)
Extubated to comfort care per family orders.

## 2023-02-24 NOTE — Progress Notes (Signed)
Paged chaplain to bedside  Chaplain arrived and speaking w/niece.

## 2023-02-24 NOTE — Progress Notes (Signed)
Patient was surrounded by [place] when patient expired. Time of death at 37 verified by This RN, Hoover Browns and Adron Bene, RN. Britton-Lee NP made aware. Only belonging with patient was shirt and family discarded in the trash. No funeral home information at this time but gave family AC contact information. Wasted 80ml of morphine with Adron Bene, RN. [facility] was called.

## 2023-02-24 NOTE — Death Summary Note (Signed)
[email or web address]  Fact Sheet for Healthcare Providers:  SeriousBroker.it  This test is no t yet approved or cleared by the [place] FDA and  has been authorized for detection and/or diagnosis of SARS-CoV-2 by FDA under an Emergency Use Authorization (EUA). This EUA will remain  in effect (meaning this test can be used) for the duration of the COVID-19 declaration under Section 564(b)(1) of the Act, 21 U.S.C.section 360bbb-3(b)(1), unless the authorization is terminated  or revoked sooner.       Influenza A by PCR NEGATIVE NEGATIVE Final   Influenza B by PCR NEGATIVE NEGATIVE Final    Comment: (NOTE) The Xpert Xpress SARS-CoV-2/FLU/RSV plus assay is intended as an aid in the diagnosis of influenza from Nasopharyngeal swab specimens and should not be used as a sole basis for treatment. Nasal washings and aspirates are unacceptable for Xpert Xpress SARS-CoV-2/FLU/RSV testing.  Fact Sheet for Patients: BloggerCourse.com  Fact Sheet for Healthcare Providers: SeriousBroker.it  This test is not  yet approved or cleared by the [place] FDA and has been authorized for detection and/or diagnosis of SARS-CoV-2 by FDA under an Emergency Use Authorization (EUA). This EUA will remain in effect (meaning this test can be used) for the duration of the COVID-19 declaration under Section 564(b)(1) of the Act, 21 U.S.C. section 360bbb-3(b)(1), unless the authorization is terminated or revoked.     Resp Syncytial Virus by PCR NEGATIVE NEGATIVE Final    Comment: (NOTE) Fact Sheet for Patients: BloggerCourse.com  Fact Sheet for Healthcare Providers: SeriousBroker.it  This test is not yet approved or cleared by the [place] FDA and has been authorized for detection and/or diagnosis of SARS-CoV-2 by FDA under an Emergency Use Authorization (EUA). This EUA will remain in effect (meaning this test can be used) for the duration of the COVID-19 declaration under Section 564(b)(1) of the Act, 21 U.S.C. section 360bbb-3(b)(1), unless the authorization is terminated or revoked.  Performed at [facility], [address] [place], [place] [address]   Blood Culture (routine x 2)     Status: None   Collection Time: 02/03/2023 11:16 AM   Specimen: BLOOD  Result Value Ref Range Status   Specimen Description BLOOD LEFT [facility]  Final   Special Requests   Final    BOTTLES DRAWN AEROBIC AND ANAEROBIC Blood Culture adequate volume   Culture   Final    NO GROWTH 5 DAYS Performed at [facility], [address] [place], [place] [address]    Report Status 03-15-2023 FINAL  Final  Blood Culture (routine x 2)     Status: Abnormal   Collection Time: 02/20/2023 11:17 AM   Specimen: BLOOD RIGHT ARM  Result Value Ref Range Status   Specimen Description   Final    BLOOD RIGHT ARM Performed at [facility], [address], [place], [place] [address]    Special Requests   Final    BOTTLES DRAWN AEROBIC AND ANAEROBIC Blood  Culture adequate volume Performed at [facility], [address] [place], [place] [address]    Culture  Setup Time   Final    GRAM POSITIVE COCCI AEROBIC BOTTLE ONLY CRITICAL RESULT CALLED TO, READ BACK BY AND VERIFIED WITH: KRISTEN MERRILL, PHARMD AT 1052 ON 02/10/23 BY GM    Culture (A)  Final    STAPHYLOCOCCUS EPIDERMIDIS THE SIGNIFICANCE OF ISOLATING THIS ORGANISM FROM A SINGLE SET OF BLOOD CULTURES WHEN MULTIPLE SETS ARE DRAWN IS UNCERTAIN. PLEASE NOTIFY THE MICROBIOLOGY DEPARTMENT WITHIN  [email or web address]  Fact Sheet for Healthcare Providers:  SeriousBroker.it  This test is no t yet approved or cleared by the [place] FDA and  has been authorized for detection and/or diagnosis of SARS-CoV-2 by FDA under an Emergency Use Authorization (EUA). This EUA will remain  in effect (meaning this test can be used) for the duration of the COVID-19 declaration under Section 564(b)(1) of the Act, 21 U.S.C.section 360bbb-3(b)(1), unless the authorization is terminated  or revoked sooner.       Influenza A by PCR NEGATIVE NEGATIVE Final   Influenza B by PCR NEGATIVE NEGATIVE Final    Comment: (NOTE) The Xpert Xpress SARS-CoV-2/FLU/RSV plus assay is intended as an aid in the diagnosis of influenza from Nasopharyngeal swab specimens and should not be used as a sole basis for treatment. Nasal washings and aspirates are unacceptable for Xpert Xpress SARS-CoV-2/FLU/RSV testing.  Fact Sheet for Patients: [email or web address].com  Fact Sheet for Healthcare Providers: SeriousBroker.it  This test is not  yet approved or cleared by the [place] FDA and has been authorized for detection and/or diagnosis of SARS-CoV-2 by FDA under an Emergency Use Authorization (EUA). This EUA will remain in effect (meaning this test can be used) for the duration of the COVID-19 declaration under Section 564(b)(1) of the Act, 21 U.S.C. section 360bbb-3(b)(1), unless the authorization is terminated or revoked.     Resp Syncytial Virus by PCR NEGATIVE NEGATIVE Final    Comment: (NOTE) Fact Sheet for Patients: BloggerCourse.com  Fact Sheet for Healthcare Providers: SeriousBroker.it  This test is not yet approved or cleared by the [place] FDA and has been authorized for detection and/or diagnosis of SARS-CoV-2 by FDA under an Emergency Use Authorization (EUA). This EUA will remain in effect (meaning this test can be used) for the duration of the COVID-19 declaration under Section 564(b)(1) of the Act, 21 U.S.C. section 360bbb-3(b)(1), unless the authorization is terminated or revoked.  Performed at [facility], [address] [place], [place] [address]   Blood Culture (routine x 2)     Status: None   Collection Time: 02/03/2023 11:16 AM   Specimen: BLOOD  Result Value Ref Range Status   Specimen Description BLOOD LEFT [facility]  Final   Special Requests   Final    BOTTLES DRAWN AEROBIC AND ANAEROBIC Blood Culture adequate volume   Culture   Final    NO GROWTH 5 DAYS Performed at [facility], [address] [place], [place] [address]    Report Status 03-15-2023 FINAL  Final  Blood Culture (routine x 2)     Status: Abnormal   Collection Time: 02/20/2023 11:17 AM   Specimen: BLOOD RIGHT ARM  Result Value Ref Range Status   Specimen Description   Final    BLOOD RIGHT ARM Performed at [facility], [address], [place], [place] [address]    Special Requests   Final    BOTTLES DRAWN AEROBIC AND ANAEROBIC Blood  Culture adequate volume Performed at [facility], [address] [place], [place] [address]    Culture  Setup Time   Final    GRAM POSITIVE COCCI AEROBIC BOTTLE ONLY CRITICAL RESULT CALLED TO, READ BACK BY AND VERIFIED WITH: KRISTEN MERRILL, PHARMD AT 1052 ON 02/10/23 BY GM    Culture (A)  Final    STAPHYLOCOCCUS EPIDERMIDIS THE SIGNIFICANCE OF ISOLATING THIS ORGANISM FROM A SINGLE SET OF BLOOD CULTURES WHEN MULTIPLE SETS ARE DRAWN IS UNCERTAIN. PLEASE NOTIFY THE MICROBIOLOGY DEPARTMENT WITHIN  [email or web address]  Fact Sheet for Healthcare Providers:  SeriousBroker.it  This test is no t yet approved or cleared by the [place] FDA and  has been authorized for detection and/or diagnosis of SARS-CoV-2 by FDA under an Emergency Use Authorization (EUA). This EUA will remain  in effect (meaning this test can be used) for the duration of the COVID-19 declaration under Section 564(b)(1) of the Act, 21 U.S.C.section 360bbb-3(b)(1), unless the authorization is terminated  or revoked sooner.       Influenza A by PCR NEGATIVE NEGATIVE Final   Influenza B by PCR NEGATIVE NEGATIVE Final    Comment: (NOTE) The Xpert Xpress SARS-CoV-2/FLU/RSV plus assay is intended as an aid in the diagnosis of influenza from Nasopharyngeal swab specimens and should not be used as a sole basis for treatment. Nasal washings and aspirates are unacceptable for Xpert Xpress SARS-CoV-2/FLU/RSV testing.  Fact Sheet for Patients: [email or web address].com  Fact Sheet for Healthcare Providers: SeriousBroker.it  This test is not  yet approved or cleared by the [place] FDA and has been authorized for detection and/or diagnosis of SARS-CoV-2 by FDA under an Emergency Use Authorization (EUA). This EUA will remain in effect (meaning this test can be used) for the duration of the COVID-19 declaration under Section 564(b)(1) of the Act, 21 U.S.C. section 360bbb-3(b)(1), unless the authorization is terminated or revoked.     Resp Syncytial Virus by PCR NEGATIVE NEGATIVE Final    Comment: (NOTE) Fact Sheet for Patients: BloggerCourse.com  Fact Sheet for Healthcare Providers: SeriousBroker.it  This test is not yet approved or cleared by the [place] FDA and has been authorized for detection and/or diagnosis of SARS-CoV-2 by FDA under an Emergency Use Authorization (EUA). This EUA will remain in effect (meaning this test can be used) for the duration of the COVID-19 declaration under Section 564(b)(1) of the Act, 21 U.S.C. section 360bbb-3(b)(1), unless the authorization is terminated or revoked.  Performed at [facility], [address] [place], [place] [address]   Blood Culture (routine x 2)     Status: None   Collection Time: 02/03/2023 11:16 AM   Specimen: BLOOD  Result Value Ref Range Status   Specimen Description BLOOD LEFT [facility]  Final   Special Requests   Final    BOTTLES DRAWN AEROBIC AND ANAEROBIC Blood Culture adequate volume   Culture   Final    NO GROWTH 5 DAYS Performed at [facility], [address] [place], [place] [address]    Report Status 03-15-2023 FINAL  Final  Blood Culture (routine x 2)     Status: Abnormal   Collection Time: 02/20/2023 11:17 AM   Specimen: BLOOD RIGHT ARM  Result Value Ref Range Status   Specimen Description   Final    BLOOD RIGHT ARM Performed at [facility], [address], [place], [place] [address]    Special Requests   Final    BOTTLES DRAWN AEROBIC AND ANAEROBIC Blood  Culture adequate volume Performed at [facility], [address] [place], [place] [address]    Culture  Setup Time   Final    GRAM POSITIVE COCCI AEROBIC BOTTLE ONLY CRITICAL RESULT CALLED TO, READ BACK BY AND VERIFIED WITH: KRISTEN MERRILL, PHARMD AT 1052 ON 02/10/23 BY GM    Culture (A)  Final    STAPHYLOCOCCUS EPIDERMIDIS THE SIGNIFICANCE OF ISOLATING THIS ORGANISM FROM A SINGLE SET OF BLOOD CULTURES WHEN MULTIPLE SETS ARE DRAWN IS UNCERTAIN. PLEASE NOTIFY THE MICROBIOLOGY DEPARTMENT WITHIN  [email or web address]  Fact Sheet for Healthcare Providers:  SeriousBroker.it  This test is no t yet approved or cleared by the [place] FDA and  has been authorized for detection and/or diagnosis of SARS-CoV-2 by FDA under an Emergency Use Authorization (EUA). This EUA will remain  in effect (meaning this test can be used) for the duration of the COVID-19 declaration under Section 564(b)(1) of the Act, 21 U.S.C.section 360bbb-3(b)(1), unless the authorization is terminated  or revoked sooner.       Influenza A by PCR NEGATIVE NEGATIVE Final   Influenza B by PCR NEGATIVE NEGATIVE Final    Comment: (NOTE) The Xpert Xpress SARS-CoV-2/FLU/RSV plus assay is intended as an aid in the diagnosis of influenza from Nasopharyngeal swab specimens and should not be used as a sole basis for treatment. Nasal washings and aspirates are unacceptable for Xpert Xpress SARS-CoV-2/FLU/RSV testing.  Fact Sheet for Patients: BloggerCourse.com  Fact Sheet for Healthcare Providers: SeriousBroker.it  This test is not  yet approved or cleared by the [place] FDA and has been authorized for detection and/or diagnosis of SARS-CoV-2 by FDA under an Emergency Use Authorization (EUA). This EUA will remain in effect (meaning this test can be used) for the duration of the COVID-19 declaration under Section 564(b)(1) of the Act, 21 U.S.C. section 360bbb-3(b)(1), unless the authorization is terminated or revoked.     Resp Syncytial Virus by PCR NEGATIVE NEGATIVE Final    Comment: (NOTE) Fact Sheet for Patients: [email or web address].com  Fact Sheet for Healthcare Providers: SeriousBroker.it  This test is not yet approved or cleared by the [place] FDA and has been authorized for detection and/or diagnosis of SARS-CoV-2 by FDA under an Emergency Use Authorization (EUA). This EUA will remain in effect (meaning this test can be used) for the duration of the COVID-19 declaration under Section 564(b)(1) of the Act, 21 U.S.C. section 360bbb-3(b)(1), unless the authorization is terminated or revoked.  Performed at [facility], [address] [place], [place] [address]   Blood Culture (routine x 2)     Status: None   Collection Time: 02/03/2023 11:16 AM   Specimen: BLOOD  Result Value Ref Range Status   Specimen Description BLOOD LEFT [facility]  Final   Special Requests   Final    BOTTLES DRAWN AEROBIC AND ANAEROBIC Blood Culture adequate volume   Culture   Final    NO GROWTH 5 DAYS Performed at [facility], [address] [place], [place] [address]    Report Status 03-15-2023 FINAL  Final  Blood Culture (routine x 2)     Status: Abnormal   Collection Time: 02/20/2023 11:17 AM   Specimen: BLOOD RIGHT ARM  Result Value Ref Range Status   Specimen Description   Final    BLOOD RIGHT ARM Performed at [facility], [address], [place], [place] [address]    Special Requests   Final    BOTTLES DRAWN AEROBIC AND ANAEROBIC Blood  Culture adequate volume Performed at [facility], [address] [place], [place] [address]    Culture  Setup Time   Final    GRAM POSITIVE COCCI AEROBIC BOTTLE ONLY CRITICAL RESULT CALLED TO, READ BACK BY AND VERIFIED WITH: KRISTEN MERRILL, PHARMD AT 1052 ON 02/10/23 BY GM    Culture (A)  Final    STAPHYLOCOCCUS EPIDERMIDIS THE SIGNIFICANCE OF ISOLATING THIS ORGANISM FROM A SINGLE SET OF BLOOD CULTURES WHEN MULTIPLE SETS ARE DRAWN IS UNCERTAIN. PLEASE NOTIFY THE MICROBIOLOGY DEPARTMENT WITHIN  [email or web address]  Fact Sheet for Healthcare Providers:  SeriousBroker.it  This test is no t yet approved or cleared by the [place] FDA and  has been authorized for detection and/or diagnosis of SARS-CoV-2 by FDA under an Emergency Use Authorization (EUA). This EUA will remain  in effect (meaning this test can be used) for the duration of the COVID-19 declaration under Section 564(b)(1) of the Act, 21 U.S.C.section 360bbb-3(b)(1), unless the authorization is terminated  or revoked sooner.       Influenza A by PCR NEGATIVE NEGATIVE Final   Influenza B by PCR NEGATIVE NEGATIVE Final    Comment: (NOTE) The Xpert Xpress SARS-CoV-2/FLU/RSV plus assay is intended as an aid in the diagnosis of influenza from Nasopharyngeal swab specimens and should not be used as a sole basis for treatment. Nasal washings and aspirates are unacceptable for Xpert Xpress SARS-CoV-2/FLU/RSV testing.  Fact Sheet for Patients: BloggerCourse.com  Fact Sheet for Healthcare Providers: SeriousBroker.it  This test is not  yet approved or cleared by the [place] FDA and has been authorized for detection and/or diagnosis of SARS-CoV-2 by FDA under an Emergency Use Authorization (EUA). This EUA will remain in effect (meaning this test can be used) for the duration of the COVID-19 declaration under Section 564(b)(1) of the Act, 21 U.S.C. section 360bbb-3(b)(1), unless the authorization is terminated or revoked.     Resp Syncytial Virus by PCR NEGATIVE NEGATIVE Final    Comment: (NOTE) Fact Sheet for Patients: BloggerCourse.com  Fact Sheet for Healthcare Providers: SeriousBroker.it  This test is not yet approved or cleared by the [place] FDA and has been authorized for detection and/or diagnosis of SARS-CoV-2 by FDA under an Emergency Use Authorization (EUA). This EUA will remain in effect (meaning this test can be used) for the duration of the COVID-19 declaration under Section 564(b)(1) of the Act, 21 U.S.C. section 360bbb-3(b)(1), unless the authorization is terminated or revoked.  Performed at [facility], [address] [place], [place] [address]   Blood Culture (routine x 2)     Status: None   Collection Time: 02/03/2023 11:16 AM   Specimen: BLOOD  Result Value Ref Range Status   Specimen Description BLOOD LEFT [facility]  Final   Special Requests   Final    BOTTLES DRAWN AEROBIC AND ANAEROBIC Blood Culture adequate volume   Culture   Final    NO GROWTH 5 DAYS Performed at [facility], [address] [place], [place] [address]    Report Status 03-15-2023 FINAL  Final  Blood Culture (routine x 2)     Status: Abnormal   Collection Time: 02/20/2023 11:17 AM   Specimen: BLOOD RIGHT ARM  Result Value Ref Range Status   Specimen Description   Final    BLOOD RIGHT ARM Performed at [facility], [address], [place], [place] [address]    Special Requests   Final    BOTTLES DRAWN AEROBIC AND ANAEROBIC Blood  Culture adequate volume Performed at [facility], [address] [place], [place] [address]    Culture  Setup Time   Final    GRAM POSITIVE COCCI AEROBIC BOTTLE ONLY CRITICAL RESULT CALLED TO, READ BACK BY AND VERIFIED WITH: KRISTEN MERRILL, PHARMD AT 1052 ON 02/10/23 BY GM    Culture (A)  Final    STAPHYLOCOCCUS EPIDERMIDIS THE SIGNIFICANCE OF ISOLATING THIS ORGANISM FROM A SINGLE SET OF BLOOD CULTURES WHEN MULTIPLE SETS ARE DRAWN IS UNCERTAIN. PLEASE NOTIFY THE MICROBIOLOGY DEPARTMENT WITHIN  157*  BUN 138* 142* 118*  --   --   --  139*  --   --   --  111*  CREATININE 6.75* 5.83* 4.62*  --   --   --  3.36*  --   --   --  3.13*  CALCIUM 8.6* 8.4* 8.4*  --   --   --  8.7*  --   --   --  8.4*  MG  --  2.3 2.3 3.0*  --  2.7* 2.9* 2.8*  --   --  2.7*  PHOS  --  6.6*  --  4.4  --  3.6 3.3 2.0* 4.9*  --  3.0   < > = values in this interval not displayed.   Liver Function Tests: Recent Labs  Lab 02/10/2023 1215 02/10/23 0438 02/13/23 0316 02/16/23 0437  AST 148* 541* 67* 34  ALT 67* 314* 139* 95*  ALKPHOS 46 50 83 70  BILITOT 0.5 1.2 1.1 0.8  PROT 6.7 6.1* 5.6* 5.5*  ALBUMIN 2.8* 2.5* 2.0* 2.1*   Recent Labs  Lab  02/22/2023 1215  LIPASE 31   No results for input(s): "AMMONIA" in the last 168 hours. CBC: Recent Labs  Lab 02/10/23 1159 02/11/23 0953 02/12/23 0413 02/13/23 1113 2023-02-16 0437 02-16-2023 1551  WBC 6.9 14.3* 19.3* 19.0* 12.7* 14.7*  NEUTROABS 5.5 12.2*  --  15.1* 10.1* 12.4*  HGB 12.6* 11.8* 10.8* 11.0* 11.3* 12.7*  HCT 38.9* 35.4* 31.4* 33.5* 36.1* 40.9  MCV 93.1 90.5 88.5 91.8 95.8 95.8  PLT 173 170 151 149* 140* 152   Cardiac Enzymes: No results for input(s): "CKTOTAL", "CKMB", "CKMBINDEX", "TROPONINI" in the last 168 hours. Sepsis Labs: Recent Labs  Lab 02/02/2023 2147 02/02/2023 2312 02/10/23 0438 02/10/23 1159 02/12/23 0413 02/13/23 0316 02/13/23 1113 16-Feb-2023 0437 02-16-23 1551  PROCALCITON  --   --   --   --   --  5.63  --  4.59  --   WBC  --  4.3 5.1   < > 19.3*  --  19.0* 12.7* 14.7*  LATICACIDVEN 3.2* 2.9* 2.9*  --   --   --   --   --  2.2*   < > = values in this interval not displayed.    Procedures/Operations  02/03/2023: ETT placement 02/20/2023: CVC placement   Rayan Ines L Rust-Chester 02-16-23, 7:57 PM  Cheryll Cockayne Rust-Chester, AGACNP-BC Acute Care Nurse Practitioner [facility]   [phone] / [phone] Please see Amion for details.  157*  BUN 138* 142* 118*  --   --   --  139*  --   --   --  111*  CREATININE 6.75* 5.83* 4.62*  --   --   --  3.36*  --   --   --  3.13*  CALCIUM 8.6* 8.4* 8.4*  --   --   --  8.7*  --   --   --  8.4*  MG  --  2.3 2.3 3.0*  --  2.7* 2.9* 2.8*  --   --  2.7*  PHOS  --  6.6*  --  4.4  --  3.6 3.3 2.0* 4.9*  --  3.0   < > = values in this interval not displayed.   Liver Function Tests: Recent Labs  Lab 02/10/2023 1215 02/10/23 0438 02/13/23 0316 02/16/23 0437  AST 148* 541* 67* 34  ALT 67* 314* 139* 95*  ALKPHOS 46 50 83 70  BILITOT 0.5 1.2 1.1 0.8  PROT 6.7 6.1* 5.6* 5.5*  ALBUMIN 2.8* 2.5* 2.0* 2.1*   Recent Labs  Lab  02/22/2023 1215  LIPASE 31   No results for input(s): "AMMONIA" in the last 168 hours. CBC: Recent Labs  Lab 02/10/23 1159 02/11/23 0953 02/12/23 0413 02/13/23 1113 2023-02-16 0437 02-16-2023 1551  WBC 6.9 14.3* 19.3* 19.0* 12.7* 14.7*  NEUTROABS 5.5 12.2*  --  15.1* 10.1* 12.4*  HGB 12.6* 11.8* 10.8* 11.0* 11.3* 12.7*  HCT 38.9* 35.4* 31.4* 33.5* 36.1* 40.9  MCV 93.1 90.5 88.5 91.8 95.8 95.8  PLT 173 170 151 149* 140* 152   Cardiac Enzymes: No results for input(s): "CKTOTAL", "CKMB", "CKMBINDEX", "TROPONINI" in the last 168 hours. Sepsis Labs: Recent Labs  Lab 02/02/2023 2147 02/02/2023 2312 02/10/23 0438 02/10/23 1159 02/12/23 0413 02/13/23 0316 02/13/23 1113 16-Feb-2023 0437 02-16-23 1551  PROCALCITON  --   --   --   --   --  5.63  --  4.59  --   WBC  --  4.3 5.1   < > 19.3*  --  19.0* 12.7* 14.7*  LATICACIDVEN 3.2* 2.9* 2.9*  --   --   --   --   --  2.2*   < > = values in this interval not displayed.    Procedures/Operations  02/03/2023: ETT placement 02/20/2023: CVC placement   Rayan Ines L Rust-Chester 02-16-23, 7:57 PM  Cheryll Cockayne Rust-Chester, AGACNP-BC Acute Care Nurse Practitioner [facility]   [phone] / [phone] Please see Amion for details.  [email or web address]  Fact Sheet for Healthcare Providers:  SeriousBroker.it  This test is no t yet approved or cleared by the [place] FDA and  has been authorized for detection and/or diagnosis of SARS-CoV-2 by FDA under an Emergency Use Authorization (EUA). This EUA will remain  in effect (meaning this test can be used) for the duration of the COVID-19 declaration under Section 564(b)(1) of the Act, 21 U.S.C.section 360bbb-3(b)(1), unless the authorization is terminated  or revoked sooner.       Influenza A by PCR NEGATIVE NEGATIVE Final   Influenza B by PCR NEGATIVE NEGATIVE Final    Comment: (NOTE) The Xpert Xpress SARS-CoV-2/FLU/RSV plus assay is intended as an aid in the diagnosis of influenza from Nasopharyngeal swab specimens and should not be used as a sole basis for treatment. Nasal washings and aspirates are unacceptable for Xpert Xpress SARS-CoV-2/FLU/RSV testing.  Fact Sheet for Patients: BloggerCourse.com  Fact Sheet for Healthcare Providers: SeriousBroker.it  This test is not  yet approved or cleared by the [place] FDA and has been authorized for detection and/or diagnosis of SARS-CoV-2 by FDA under an Emergency Use Authorization (EUA). This EUA will remain in effect (meaning this test can be used) for the duration of the COVID-19 declaration under Section 564(b)(1) of the Act, 21 U.S.C. section 360bbb-3(b)(1), unless the authorization is terminated or revoked.     Resp Syncytial Virus by PCR NEGATIVE NEGATIVE Final    Comment: (NOTE) Fact Sheet for Patients: BloggerCourse.com  Fact Sheet for Healthcare Providers: SeriousBroker.it  This test is not yet approved or cleared by the [place] FDA and has been authorized for detection and/or diagnosis of SARS-CoV-2 by FDA under an Emergency Use Authorization (EUA). This EUA will remain in effect (meaning this test can be used) for the duration of the COVID-19 declaration under Section 564(b)(1) of the Act, 21 U.S.C. section 360bbb-3(b)(1), unless the authorization is terminated or revoked.  Performed at [facility], [address] [place], [place] [address]   Blood Culture (routine x 2)     Status: None   Collection Time: 02/03/2023 11:16 AM   Specimen: BLOOD  Result Value Ref Range Status   Specimen Description BLOOD LEFT [facility]  Final   Special Requests   Final    BOTTLES DRAWN AEROBIC AND ANAEROBIC Blood Culture adequate volume   Culture   Final    NO GROWTH 5 DAYS Performed at [facility], [address] [place], [place] [address]    Report Status 03-15-2023 FINAL  Final  Blood Culture (routine x 2)     Status: Abnormal   Collection Time: 02/20/2023 11:17 AM   Specimen: BLOOD RIGHT ARM  Result Value Ref Range Status   Specimen Description   Final    BLOOD RIGHT ARM Performed at [facility], [address], [place], [place] [address]    Special Requests   Final    BOTTLES DRAWN AEROBIC AND ANAEROBIC Blood  Culture adequate volume Performed at [facility], [address] [place], [place] [address]    Culture  Setup Time   Final    GRAM POSITIVE COCCI AEROBIC BOTTLE ONLY CRITICAL RESULT CALLED TO, READ BACK BY AND VERIFIED WITH: KRISTEN MERRILL, PHARMD AT 1052 ON 02/10/23 BY GM    Culture (A)  Final    STAPHYLOCOCCUS EPIDERMIDIS THE SIGNIFICANCE OF ISOLATING THIS ORGANISM FROM A SINGLE SET OF BLOOD CULTURES WHEN MULTIPLE SETS ARE DRAWN IS UNCERTAIN. PLEASE NOTIFY THE MICROBIOLOGY DEPARTMENT WITHIN

## 2023-02-24 NOTE — Progress Notes (Signed)
Notified pts niece Lauralee Evener via telephone Mr. Bauernfeind has declined significantly.  I informed her due to severe hypotension he is now requiring 2 medications in an attempt to keep his blood pressure within normal limits.  She stated she will come to bedside to see Mr. Deeds.  Will continue to monitor and assess pt.   Zada Girt, AGNP  Pulmonary/Critical Care Pager [phone] (please enter 7 digits) PCCM Consult Pager [phone] (please enter 7 digits)

## 2023-02-24 DEATH — deceased
